# Patient Record
Sex: Female | Born: 1945 | Race: Black or African American | Hispanic: No | Smoking: Current some day smoker
Health system: Southern US, Community
[De-identification: ages and names within clinical notes are randomized; demographics above are authoritative.]

## PROBLEM LIST (undated history)

## (undated) DIAGNOSIS — E119 Type 2 diabetes mellitus without complications: Secondary | ICD-10-CM

## (undated) DIAGNOSIS — M1990 Unspecified osteoarthritis, unspecified site: Secondary | ICD-10-CM

## (undated) DIAGNOSIS — C801 Malignant (primary) neoplasm, unspecified: Secondary | ICD-10-CM

## (undated) DIAGNOSIS — E049 Nontoxic goiter, unspecified: Secondary | ICD-10-CM

## (undated) DIAGNOSIS — E7800 Pure hypercholesterolemia, unspecified: Secondary | ICD-10-CM

## (undated) DIAGNOSIS — I10 Essential (primary) hypertension: Secondary | ICD-10-CM

## (undated) DIAGNOSIS — K219 Gastro-esophageal reflux disease without esophagitis: Secondary | ICD-10-CM

## (undated) HISTORY — PX: BACK SURGERY: SHX140

## (undated) HISTORY — PX: BUNIONECTOMY: SHX129

## (undated) HISTORY — PX: TUBAL LIGATION: SHX77

## (undated) HISTORY — PX: CHOLECYSTECTOMY: SHX55

## (undated) HISTORY — PX: EYE SURGERY: SHX253

## (undated) HISTORY — PX: JOINT REPLACEMENT: SHX530

---

## 1995-08-13 HISTORY — PX: LAMINECTOMY: SHX219

## 2001-12-04 ENCOUNTER — Ambulatory Visit (HOSPITAL_COMMUNITY): Admission: RE | Admit: 2001-12-04 | Discharge: 2001-12-04 | Payer: Self-pay | Admitting: Family Medicine

## 2001-12-04 ENCOUNTER — Encounter: Payer: Self-pay | Admitting: Family Medicine

## 2003-01-25 ENCOUNTER — Encounter: Payer: Self-pay | Admitting: Family Medicine

## 2003-01-25 ENCOUNTER — Ambulatory Visit (HOSPITAL_COMMUNITY): Admission: RE | Admit: 2003-01-25 | Discharge: 2003-01-25 | Payer: Self-pay | Admitting: Family Medicine

## 2004-09-11 ENCOUNTER — Emergency Department (HOSPITAL_COMMUNITY): Admission: EM | Admit: 2004-09-11 | Discharge: 2004-09-11 | Payer: Self-pay | Admitting: Emergency Medicine

## 2005-01-23 ENCOUNTER — Ambulatory Visit: Payer: Self-pay | Admitting: Orthopedic Surgery

## 2007-08-13 HISTORY — PX: COLONOSCOPY: SHX5424

## 2008-05-19 ENCOUNTER — Ambulatory Visit: Payer: Self-pay | Admitting: Gastroenterology

## 2008-05-27 ENCOUNTER — Ambulatory Visit (HOSPITAL_COMMUNITY): Admission: RE | Admit: 2008-05-27 | Discharge: 2008-05-27 | Payer: Self-pay | Admitting: Gastroenterology

## 2008-05-27 ENCOUNTER — Encounter: Payer: Self-pay | Admitting: Gastroenterology

## 2008-05-27 ENCOUNTER — Ambulatory Visit: Payer: Self-pay | Admitting: Gastroenterology

## 2008-05-31 ENCOUNTER — Emergency Department (HOSPITAL_COMMUNITY): Admission: EM | Admit: 2008-05-31 | Discharge: 2008-05-31 | Payer: Self-pay | Admitting: Emergency Medicine

## 2008-12-20 ENCOUNTER — Encounter (INDEPENDENT_AMBULATORY_CARE_PROVIDER_SITE_OTHER): Payer: Self-pay | Admitting: *Deleted

## 2009-02-03 DIAGNOSIS — E785 Hyperlipidemia, unspecified: Secondary | ICD-10-CM | POA: Insufficient documentation

## 2009-02-03 DIAGNOSIS — K219 Gastro-esophageal reflux disease without esophagitis: Secondary | ICD-10-CM | POA: Insufficient documentation

## 2009-02-03 DIAGNOSIS — M129 Arthropathy, unspecified: Secondary | ICD-10-CM | POA: Insufficient documentation

## 2009-02-03 DIAGNOSIS — D126 Benign neoplasm of colon, unspecified: Secondary | ICD-10-CM | POA: Insufficient documentation

## 2009-02-03 DIAGNOSIS — I10 Essential (primary) hypertension: Secondary | ICD-10-CM | POA: Insufficient documentation

## 2009-02-09 ENCOUNTER — Ambulatory Visit: Payer: Self-pay | Admitting: Gastroenterology

## 2009-11-15 ENCOUNTER — Encounter (HOSPITAL_COMMUNITY): Admission: RE | Admit: 2009-11-15 | Discharge: 2009-12-15 | Payer: Self-pay | Admitting: Family Medicine

## 2009-12-25 ENCOUNTER — Ambulatory Visit (HOSPITAL_COMMUNITY): Admission: RE | Admit: 2009-12-25 | Discharge: 2009-12-25 | Payer: Self-pay | Admitting: Family Medicine

## 2010-12-25 ENCOUNTER — Other Ambulatory Visit (HOSPITAL_COMMUNITY): Payer: Self-pay | Admitting: Family Medicine

## 2010-12-25 DIAGNOSIS — E049 Nontoxic goiter, unspecified: Secondary | ICD-10-CM

## 2010-12-25 DIAGNOSIS — Z139 Encounter for screening, unspecified: Secondary | ICD-10-CM

## 2010-12-25 NOTE — Assessment & Plan Note (Signed)
NAME:  Kristina Huang, Kristina Huang              CHART#:  [id number]   DATE:  05/19/2008                       DOB:  [birth year]   REFERRING PHYSICIAN:  Reynolds Bowl   REASON FOR CONSULTATION:  Irritable bowel syndrome.   HISTORY OF PRESENT ILLNESS:  The patient is a 65 year old female, who  has never had a colonoscopy.  She has intermittent changes in her bowel  habits and abdominal pain.  She denies any rectal bleeding.  She may go  7-8 times a day; sometimes it is 3-4 times a day.  She denies any  diarrhea.  She is not taking any fiber.  She has been gaining weight.  She does use ibuprofen rarely.  She denies any use of Aleve, BC Powder  or Goody's Powder.  She uses Tylenol for arthritis pain.   PAST MEDICAL HISTORY:  1. Arthritis.  2. Hypertension.  3. GERD.  4. Hyperlipidemia.   PAST SURGICAL HISTORY:  1. Tubal ligation.  2. Cholecystectomy for stones.  3. Spinal fusion.   ALLERGIES:  No known drug allergies.   MEDICATIONS:  1. Tylenol.  2. Lovastatin.  3. Lisinopril/hydrochlorothiazide.  4. Omeprazole 20 mg daily.  5. Tramadol 50 mg two b.i.d.   FAMILY HISTORY:  She has a family history of diabetes and Crohn disease.  She denies any family history of colon cancer or colon polyps.   SOCIAL HISTORY:  She has been walking for 2-3 months, trying to lose  weight.  She is divorced.  She is unemployed since 1997.  She quit  smoking a month ago.  She rarely drinks alcohol.   REVIEW OF SYSTEMS:  As per the HPI, otherwise all systems are negative.   PHYSICAL EXAMINATION:  VITAL SIGNS:  Weight 171 pounds, height 5 feet 3  inches, temperature 96, blood pressure 110/70, pulse 104.GENERAL:  She  is in no apparent distress.  Alert and oriented x4.HEENT:  Atraumatic,  normocephalic.  Pupils are equal and reactive to light.  Mouth, no oral  lesions.  Posterior pharynx without erythema or exudate.LUNGS:  Clear to  auscultation bilaterally.CARDIOVASCULAR:  Regular rhythm.  No murmur.  Normal S1 and S2.ABDOMEN:  Bowel sounds are present, soft, nontender,  nondistended.  No rebound or guarding.EXTREMITIES:  No cyanosis or  edema.NEURO:  She has no focal neurologic deficits.   ASSESSMENT:  The patient is a 65 year old female, who has intermittent  changes in her bowel habits which is likely related to irritable bowel  syndrome.  Her reflux is fairly well controlled on omeprazole 20 mg a  day. Thank you for allowing me to see the patient in consultation.  My  recommendations follow.   RECOMMENDATIONS:  1. She is given discharge instructions in writing.  She is asked to      continue take omeprazole 30 minutes before first meal.  She is      asked to follow a low-fat diet and lose 10 pounds over the next 3      months which will help with her reflux.  2. She will be scheduled for colonoscopy within the next 2 weeks.  She      has a follow up appointment to see me in 3 months.  She is also      asked to add fiber to her diet.       [employer],  M.D.  Electronically Signed     SM/MEDQ  D:  05/19/2008  T:  05/20/2008  Job:  [id number]   cc:   Dr. __________

## 2010-12-25 NOTE — Op Note (Signed)
Kristina Huang             ACCOUNT NO.:  [id number]   MEDICAL RECORD NO.:  [id number]          PATIENT TYPE:  AMB   LOCATION:  DAY                           FACILITY:  [facility]   PHYSICIAN:  Kassie Mends, M.D.      DATE OF BIRTH:  1946-02-23   DATE OF PROCEDURE:  05/27/2008  DATE OF DISCHARGE:                               OPERATIVE REPORT   REFERRING Tyna Huertas:  Dr. Reynolds Bowl.   PROCEDURE:  Colonoscopy with cold forceps and snare cautery polypectomy.   INDICATION FOR EXAM:  Kristina Huang is a 65 year old female who has never  had a colonoscopy.  She was complaining of changes in her bowel habits.   FINDINGS:  1. A 8-mm sessile cecal polyp removed via snare cautery (20 watts).  A      3-mm ascending colon polyp removed via cold forceps.  A 1.5-cm      serpiginous ascending colon polyp located on a fold, which was      removed via snare cautery (25 watts).  2. Frequent sigmoid colon diverticula.  Otherwise, no masses,      inflammatory changes, or AVMs seen.  3. Moderate internal hemorrhoids.  Otherwise, normal retroflexed view      of the rectum.   DIAGNOSES:  1. Colon polyps.  2. Mild sigmoid colon diverticulosis.  3. Moderate internal hemorrhoids.   RECOMMENDATIONS:  1. She should begin high-fiber diet on June 04, 2008.  2. No aspirin until June 04, 2008.  3. Screening colonoscopy in 5 years.  If her polyp shows advanced      features, then would consider screening colonoscopy in 3 years.  4. We will call her with the results of her biopsies.  5. She is given a handout on polyps, diverticulosis, hemorrhoids, and      high-fiber diet.  No aspirin, NSAIDs, or anticoagulation for 7      days.   MEDICATIONS:  1. Demerol 75 mg IV.  2. Versed 5 mg IV.   PROCEDURE TECHNIQUE:  Physical exam was performed.  Informed consent was  obtained from the patient after explaining the benefits, risks, and  alternatives to the procedure.  The patient was connected to the  monitor  and placed in left lateral position.  Continuous oxygen was provided by  nasal cannula and IV medicine administered through an indwelling  cannula.  After administration of sedation and rectal exam, the  patient's rectum was intubated and scope was advanced under direct  visualization to the cecum.  Scope was removed slowly by carefully  examining the color, texture, anatomy, and integrity of the mucosa on  the way out.  The patient was recovered in endoscopy and discharged home  in satisfactory condition.   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  Electronically Signed     SM/MEDQ  D:  05/27/2008  T:  05/27/2008  Job:  [id number]   cc:   Dr. Reynolds Bowl

## 2011-01-01 ENCOUNTER — Ambulatory Visit (HOSPITAL_COMMUNITY)
Admission: RE | Admit: 2011-01-01 | Discharge: 2011-01-01 | Disposition: A | Discharge status: Home / Self Care | Payer: Medicare FFS | Source: Ambulatory Visit | Attending: Family Medicine | Admitting: Family Medicine

## 2011-01-01 DIAGNOSIS — Z139 Encounter for screening, unspecified: Secondary | ICD-10-CM

## 2011-01-01 DIAGNOSIS — E049 Nontoxic goiter, unspecified: Secondary | ICD-10-CM

## 2011-01-01 DIAGNOSIS — Z1231 Encounter for screening mammogram for malignant neoplasm of breast: Secondary | ICD-10-CM | POA: Insufficient documentation

## 2012-08-12 ENCOUNTER — Emergency Department (HOSPITAL_COMMUNITY)
Admission: EM | Admit: 2012-08-12 | Discharge: 2012-08-12 | Disposition: A | Discharge status: Home / Self Care | Payer: Medicare FFS | Attending: Emergency Medicine | Admitting: Emergency Medicine

## 2012-08-12 ENCOUNTER — Encounter (HOSPITAL_COMMUNITY): Payer: Self-pay

## 2012-08-12 DIAGNOSIS — R5383 Other fatigue: Secondary | ICD-10-CM | POA: Insufficient documentation

## 2012-08-12 DIAGNOSIS — K5289 Other specified noninfective gastroenteritis and colitis: Secondary | ICD-10-CM | POA: Insufficient documentation

## 2012-08-12 DIAGNOSIS — R197 Diarrhea, unspecified: Secondary | ICD-10-CM | POA: Insufficient documentation

## 2012-08-12 DIAGNOSIS — Z79899 Other long term (current) drug therapy: Secondary | ICD-10-CM | POA: Insufficient documentation

## 2012-08-12 DIAGNOSIS — E119 Type 2 diabetes mellitus without complications: Secondary | ICD-10-CM | POA: Insufficient documentation

## 2012-08-12 DIAGNOSIS — K529 Noninfective gastroenteritis and colitis, unspecified: Secondary | ICD-10-CM

## 2012-08-12 DIAGNOSIS — R51 Headache: Secondary | ICD-10-CM | POA: Insufficient documentation

## 2012-08-12 DIAGNOSIS — E7800 Pure hypercholesterolemia, unspecified: Secondary | ICD-10-CM | POA: Insufficient documentation

## 2012-08-12 DIAGNOSIS — K219 Gastro-esophageal reflux disease without esophagitis: Secondary | ICD-10-CM | POA: Insufficient documentation

## 2012-08-12 DIAGNOSIS — R5381 Other malaise: Secondary | ICD-10-CM | POA: Insufficient documentation

## 2012-08-12 HISTORY — DX: Gastro-esophageal reflux disease without esophagitis: K21.9

## 2012-08-12 HISTORY — DX: Type 2 diabetes mellitus without complications: E11.9

## 2012-08-12 HISTORY — DX: Pure hypercholesterolemia, unspecified: E78.00

## 2012-08-12 MED ORDER — LOPERAMIDE HCL 2 MG PO TABS: 2.0000 mg | ORAL_TABLET | Freq: Four times a day (QID) | ORAL | Status: DC | PRN

## 2012-08-12 MED ORDER — SODIUM CHLORIDE 0.9 % IV SOLN
Freq: Once | INTRAVENOUS | Status: AC
  Administered 2012-08-12: 11:00:00 via INTRAVENOUS

## 2012-08-12 NOTE — ED Provider Notes (Signed)
History   This chart was scribed for Shelda Jakes, MD, by Frederik Pear, ER scribe. The patient was seen in room [facility]18[facility] and the patient's care was started at 1203.    CSN: [id number]  Arrival date & time 08/12/12  1006   First MD Initiated Contact with Patient 08/12/12 1203      Chief Complaint  Patient presents with  . Emesis  . Diarrhea  . Headache    (Consider location/radiation/quality/duration/timing/severity/associated sxs/prior treatment) HPI  Kristina Huang is a 67 y.o. female who presents to the Emergency Department complaining of intermittent, moderate diarrhea with associated gradually improving emesis with her last episode at 00:00 and headache that began yesterday at 17:00. She denies any episodes of emesis today. She denies any hematochezia, abdominal pain, or hematuria. She reports several sick contacts that have recently had the same symptoms.    PCP is [facility].  Past Medical History  Diagnosis Date  . Diabetes mellitus without complication   . Hypercholesterolemia   . Acid reflux     Past Surgical History  Procedure Date  . Back surgery     spinal fusion  . Cholecystectomy   . Tubal ligation     No family history on file.  History  Substance Use Topics  . Smoking status: Never Smoker   . Smokeless tobacco: Not on file  . Alcohol Use: No    OB History    Grav Para Term Preterm Abortions TAB SAB Ect Mult Living                  Review of Systems  Constitutional: Positive for fatigue.  HENT: Negative for congestion.   Eyes: Negative for redness.  Respiratory: Negative for cough and shortness of breath.   Cardiovascular: Negative for chest pain.  Gastrointestinal: Positive for vomiting and diarrhea.  Genitourinary: Negative for dysuria.  Musculoskeletal: Negative for myalgias and back pain.  Skin: Negative for rash.  Neurological: Positive for headaches.  Hematological: Does not bruise/bleed easily.    Psychiatric/Behavioral: Negative for confusion.  All other systems reviewed and are negative.    Allergies  Review of patient's allergies indicates no known allergies.  Home Medications   Current Outpatient Rx  Name  Route  Sig  Dispense  Refill  . ACETAMINOPHEN ER 650 MG PO TBCR   Oral   Take 1,300 mg by mouth every 8 (eight) hours as needed. Pain.         . ASPIRIN EC 81 MG PO TBEC   Oral   Take 81 mg by mouth daily.         Marland Kitchen LOVASTATIN 20 MG PO TABS   Oral   Take 20 mg by mouth at bedtime.         Marland Kitchen METFORMIN HCL 500 MG PO TABS   Oral   Take 500 mg by mouth daily with breakfast.         . OMEPRAZOLE 20 MG PO CPDR   Oral   Take 20 mg by mouth daily.         Marland Kitchen LOPERAMIDE HCL 2 MG PO TABS   Oral   Take 1 tablet (2 mg total) by mouth 4 (four) times daily as needed for diarrhea or loose stools.   30 tablet   1     BP 128/86  Pulse 96  Temp 99.2 F (37.3 C) (Oral)  Resp 18  Ht 5\' 3"  (1.6 m)  Wt 165 lb (74.844 kg)  BMI 29.23 kg/m2  SpO2 99%  Physical Exam  Nursing note and vitals reviewed. Constitutional: She is oriented to person, place, and time. She appears well-developed and well-nourished.  HENT:  Head: Normocephalic and atraumatic.  Neck: Neck supple.  Cardiovascular: Normal rate, regular rhythm and normal heart sounds.   No murmur heard. Pulmonary/Chest: Effort normal and breath sounds normal. No respiratory distress. She has no wheezes. She has no rales. She exhibits no tenderness.  Abdominal: Soft. Bowel sounds are normal. She exhibits no distension and no mass. There is no tenderness. There is no rebound and no guarding.  Musculoskeletal: Normal range of motion. She exhibits no edema.  Neurological: She is alert and oriented to person, place, and time. No cranial nerve deficit. She exhibits normal muscle tone. Coordination normal.  Skin: Skin is warm and dry.  Psychiatric: She has a normal mood and affect. Thought content normal.     ED Course  Procedures (including critical care time)  DIAGNOSTIC STUDIES: Oxygen Saturation is 99% on room air, normal by my interpretation.    COORDINATION OF CARE:  13:04- Discussed planned course of treatment with the patient, including IV fluids, who is agreeable at this time.   Labs Reviewed - No data to display No results found.   1. Gastroenteritis       MDM  Symptoms consistent with viral gastroenteritis and patient improving no further vomiting. Non toxic and in NAD. Improved with fluids.   I personally performed the services described in this documentation, which was scribed in my presence. The recorded information has been reviewed and is accurate.         Shelda Jakes, MD 08/15/12 1155

## 2012-08-12 NOTE — ED Notes (Signed)
Pt reports vomiting and diarrhea since last night.  C/O headache this morning.

## 2012-10-04 ENCOUNTER — Emergency Department (HOSPITAL_COMMUNITY): Payer: Medicare FFS

## 2012-10-04 ENCOUNTER — Emergency Department (HOSPITAL_COMMUNITY)
Admission: EM | Admit: 2012-10-04 | Discharge: 2012-10-04 | Disposition: A | Discharge status: Home / Self Care | Payer: Medicare FFS | Attending: Emergency Medicine | Admitting: Emergency Medicine

## 2012-10-04 ENCOUNTER — Encounter (HOSPITAL_COMMUNITY): Payer: Self-pay | Admitting: *Deleted

## 2012-10-04 DIAGNOSIS — T148XXA Other injury of unspecified body region, initial encounter: Secondary | ICD-10-CM

## 2012-10-04 DIAGNOSIS — S91309A Unspecified open wound, unspecified foot, initial encounter: Secondary | ICD-10-CM | POA: Insufficient documentation

## 2012-10-04 DIAGNOSIS — Z79899 Other long term (current) drug therapy: Secondary | ICD-10-CM | POA: Insufficient documentation

## 2012-10-04 DIAGNOSIS — E119 Type 2 diabetes mellitus without complications: Secondary | ICD-10-CM | POA: Insufficient documentation

## 2012-10-04 DIAGNOSIS — Z7982 Long term (current) use of aspirin: Secondary | ICD-10-CM | POA: Insufficient documentation

## 2012-10-04 DIAGNOSIS — K219 Gastro-esophageal reflux disease without esophagitis: Secondary | ICD-10-CM | POA: Insufficient documentation

## 2012-10-04 DIAGNOSIS — E7800 Pure hypercholesterolemia, unspecified: Secondary | ICD-10-CM | POA: Insufficient documentation

## 2012-10-04 DIAGNOSIS — X58XXXA Exposure to other specified factors, initial encounter: Secondary | ICD-10-CM | POA: Insufficient documentation

## 2012-10-04 DIAGNOSIS — Y929 Unspecified place or not applicable: Secondary | ICD-10-CM | POA: Insufficient documentation

## 2012-10-04 DIAGNOSIS — Y9389 Activity, other specified: Secondary | ICD-10-CM | POA: Insufficient documentation

## 2012-10-04 MED ORDER — SULFAMETHOXAZOLE-TRIMETHOPRIM 800-160 MG PO TABS: 1.0000 | ORAL_TABLET | Freq: Two times a day (BID) | ORAL | Status: DC

## 2012-10-04 MED ORDER — LIDOCAINE HCL (PF) 1 % IJ SOLN
INTRAMUSCULAR | Status: AC
  Filled 2012-10-04: qty 5

## 2012-10-04 MED ORDER — HYDROCODONE-ACETAMINOPHEN 5-325 MG PO TABS: 2.0000 | ORAL_TABLET | ORAL | Status: DC | PRN

## 2012-10-04 NOTE — ED Notes (Signed)
Pt c/o right foot pain. Pt states she stepped on toothpick this evening and part of it "broke off into foot". No foreign body noted on assessment. Xray has been ordered.

## 2012-10-04 NOTE — ED Notes (Signed)
Pt says she stepped on a toothpick and part of it broke of of it in her right foot.

## 2012-10-04 NOTE — ED Provider Notes (Signed)
History     CSN: [id number]  Arrival date & time 10/04/12  1903   First MD Initiated Contact with Patient 10/04/12 1948      Chief Complaint  Patient presents with  . Foreign Body  . Foot Pain    (Consider location/radiation/quality/duration/timing/severity/associated sxs/prior treatment) Patient is a 67 y.o. female presenting with foreign body and lower extremity pain. The history is provided by the patient. No language interpreter was used.  Foreign Body  The current episode started 3 to 5 hours ago. The incident was witnessed.  Foot Pain  Pt reports she stepped on a toothpick.   Pt reports she thinks she has a piece left in her foot.  Past Medical History  Diagnosis Date  . Diabetes mellitus without complication   . Hypercholesterolemia   . Acid reflux     Past Surgical History  Procedure Laterality Date  . Back surgery      spinal fusion  . Cholecystectomy    . Tubal ligation      History reviewed. No pertinent family history.  History  Substance Use Topics  . Smoking status: Never Smoker   . Smokeless tobacco: Not on file  . Alcohol Use: No    OB History   Grav Para Term Preterm Abortions TAB SAB Ect Mult Living                  Review of Systems  All other systems reviewed and are negative.    Allergies  Review of patient's allergies indicates no known allergies.  Home Medications   Current Outpatient Rx  Name  Route  Sig  Dispense  Refill  . acetaminophen (TYLENOL) 650 MG CR tablet   Oral   Take 1,300 mg by mouth every 8 (eight) hours as needed. Pain.         Marland Kitchen aspirin EC 81 MG tablet   Oral   Take 81 mg by mouth every evening.          . lovastatin (MEVACOR) 20 MG tablet   Oral   Take 20 mg by mouth at bedtime.         . metFORMIN (GLUCOPHAGE) 500 MG tablet   Oral   Take 500 mg by mouth daily with breakfast.         . omeprazole (PRILOSEC) 20 MG capsule   Oral   Take 20 mg by mouth daily.           BP 147/84   Pulse 97  Temp(Src) 97.9 F (36.6 C) (Oral)  Resp 20  Ht 5\' 3"  (1.6 m)  Wt 162 lb (73.483 kg)  BMI 28.7 kg/m2  SpO2 95%  Physical Exam  Nursing note and vitals reviewed. Constitutional: She appears well-developed and well-nourished.  Musculoskeletal: She exhibits tenderness.  Small punture wound   Neurological: She is alert.  Skin: Skin is warm.  Psychiatric: She has a normal mood and affect.    ED Course  Procedures (including critical care time)  Labs Reviewed - No data to display Dg Foot Complete Right  10/04/2012  *RADIOLOGY REPORT*  Clinical Data: Right foot pain following stepped on toothpick.  RIGHT FOOT COMPLETE - 3+ VIEW  Comparison: None  Findings: There is no evidence of acute fracture, subluxation or dislocation. No unexpected radiopaque foreign bodies are identified. A surgical screw within the first metatarsal head is noted. The Lisfranc joints are intact. A small calcaneal spur is present.  IMPRESSION: No evidence of acute bony abnormality or  unexpected radiopaque foreign body.   Original Report Authenticated By: Harmon Pier, M.D.      No diagnosis found.    MDM  Local xylo to area,   I explored with forcep,  I am unable to palpate a foreign body.    I counseled pt.   I advised her to call Dr. Emelda Fear to be seen this week.  Pt is diabetic.   I will cover with bactrim,  Pt given vicodin for pain       Lonia Skinner [place], [place] 10/04/12 2044

## 2012-10-04 NOTE — ED Provider Notes (Signed)
Medical screening examination/treatment/procedure(s) were performed by non-physician practitioner and as supervising physician I was immediately available for consultation/collaboration.   Pura Picinich L Elayah Klooster, MD 10/04/12 2240 

## 2013-01-13 ENCOUNTER — Emergency Department (HOSPITAL_COMMUNITY)
Admission: EM | Admit: 2013-01-13 | Discharge: 2013-01-13 | Disposition: A | Discharge status: Home / Self Care | Payer: Medicare FFS | Attending: Emergency Medicine | Admitting: Emergency Medicine

## 2013-01-13 ENCOUNTER — Encounter (HOSPITAL_COMMUNITY): Payer: Self-pay | Admitting: Emergency Medicine

## 2013-01-13 DIAGNOSIS — Z79899 Other long term (current) drug therapy: Secondary | ICD-10-CM | POA: Insufficient documentation

## 2013-01-13 DIAGNOSIS — Z9089 Acquired absence of other organs: Secondary | ICD-10-CM | POA: Insufficient documentation

## 2013-01-13 DIAGNOSIS — K219 Gastro-esophageal reflux disease without esophagitis: Secondary | ICD-10-CM | POA: Insufficient documentation

## 2013-01-13 DIAGNOSIS — Z9889 Other specified postprocedural states: Secondary | ICD-10-CM | POA: Insufficient documentation

## 2013-01-13 DIAGNOSIS — M545 Low back pain, unspecified: Secondary | ICD-10-CM | POA: Insufficient documentation

## 2013-01-13 DIAGNOSIS — Z7982 Long term (current) use of aspirin: Secondary | ICD-10-CM | POA: Insufficient documentation

## 2013-01-13 DIAGNOSIS — M549 Dorsalgia, unspecified: Secondary | ICD-10-CM

## 2013-01-13 DIAGNOSIS — E7800 Pure hypercholesterolemia, unspecified: Secondary | ICD-10-CM | POA: Insufficient documentation

## 2013-01-13 DIAGNOSIS — Z9851 Tubal ligation status: Secondary | ICD-10-CM | POA: Insufficient documentation

## 2013-01-13 DIAGNOSIS — E119 Type 2 diabetes mellitus without complications: Secondary | ICD-10-CM | POA: Insufficient documentation

## 2013-01-13 LAB — URINALYSIS, ROUTINE W REFLEX MICROSCOPIC
Bilirubin Urine: NEGATIVE
Leukocytes, UA: NEGATIVE
Nitrite: NEGATIVE
Specific Gravity, Urine: 1.02 (ref 1.005–1.030)
pH: 7 (ref 5.0–8.0)

## 2013-01-13 MED ORDER — HYDROCODONE-ACETAMINOPHEN 5-325 MG PO TABS: 1.0000 | ORAL_TABLET | Freq: Four times a day (QID) | ORAL | Status: DC | PRN

## 2013-01-13 MED ORDER — CYCLOBENZAPRINE HCL 10 MG PO TABS: 10.0000 mg | ORAL_TABLET | Freq: Three times a day (TID) | ORAL | Status: DC | PRN

## 2013-01-13 MED ORDER — HYDROMORPHONE HCL PF 1 MG/ML IJ SOLN
1.0000 mg | Freq: Once | INTRAMUSCULAR | Status: AC
  Administered 2013-01-13: 1 mg via INTRAMUSCULAR
  Filled 2013-01-13: qty 1

## 2013-01-13 NOTE — ED Provider Notes (Signed)
History     CSN: [id number]  Arrival date & time 01/13/13  1706   First MD Initiated Contact with Patient 01/13/13 1745      Chief Complaint  Patient presents with  . Flank Pain    (Consider location/radiation/quality/duration/timing/severity/associated sxs/prior treatment) Patient is a 67 y.o. female presenting with flank pain. The history is provided by the patient (the pt complains of back pain.  worse with movement). No language interpreter was used.  Flank Pain This is a new problem. The current episode started more than 2 days ago. The problem occurs daily. The problem has not changed since onset.Pertinent negatives include no chest pain, no abdominal pain and no headaches. Nothing aggravates the symptoms. Nothing relieves the symptoms.    Past Medical History  Diagnosis Date  . Diabetes mellitus without complication   . Hypercholesterolemia   . Acid reflux     Past Surgical History  Procedure Laterality Date  . Back surgery      spinal fusion  . Cholecystectomy    . Tubal ligation      No family history on file.  History  Substance Use Topics  . Smoking status: Never Smoker   . Smokeless tobacco: Not on file  . Alcohol Use: No    OB History   Grav Para Term Preterm Abortions TAB SAB Ect Mult Living                  Review of Systems  Constitutional: Negative for appetite change and fatigue.  HENT: Negative for congestion, sinus pressure and ear discharge.   Eyes: Negative for discharge.  Respiratory: Negative for cough.   Cardiovascular: Negative for chest pain.  Gastrointestinal: Negative for abdominal pain and diarrhea.  Genitourinary: Positive for flank pain. Negative for frequency and hematuria.  Musculoskeletal: Negative for back pain.  Skin: Negative for rash.  Neurological: Negative for seizures and headaches.  Psychiatric/Behavioral: Negative for hallucinations.    Allergies  Review of patient's allergies indicates no known  allergies.  Home Medications   Current Outpatient Rx  Name  Route  Sig  Dispense  Refill  . acetaminophen (TYLENOL) 650 MG CR tablet   Oral   Take 1,300 mg by mouth every 8 (eight) hours as needed. Pain.         Marland Kitchen aspirin EC 81 MG tablet   Oral   Take 81 mg by mouth every evening.          . lovastatin (MEVACOR) 20 MG tablet   Oral   Take 20 mg by mouth at bedtime.         . metFORMIN (GLUCOPHAGE) 500 MG tablet   Oral   Take 500 mg by mouth daily with breakfast.         . omeprazole (PRILOSEC) 20 MG capsule   Oral   Take 20 mg by mouth daily.         . cyclobenzaprine (FLEXERIL) 10 MG tablet   Oral   Take 1 tablet (10 mg total) by mouth 3 (three) times daily as needed for muscle spasms.   30 tablet   0   . HYDROcodone-acetaminophen (NORCO/VICODIN) 5-325 MG per tablet   Oral   Take 1 tablet by mouth every 6 (six) hours as needed for pain.   30 tablet   0     BP 135/75  Pulse 71  Temp(Src) 98.7 F (37.1 C) (Oral)  Resp 18  Ht 5\' 3"  (1.6 m)  Wt 165 lb (74.844  kg)  BMI 29.24 kg/m2  SpO2 95%  Physical Exam  Constitutional: She is oriented to person, place, and time. She appears well-developed.  HENT:  Head: Normocephalic.  Eyes: Conjunctivae and EOM are normal. No scleral icterus.  Neck: Neck supple. No thyromegaly present.  Cardiovascular: Normal rate and regular rhythm.  Exam reveals no gallop and no friction rub.   No murmur heard. Pulmonary/Chest: No stridor. She has no wheezes. She has no rales. She exhibits no tenderness.  Abdominal: She exhibits no distension. There is no tenderness. There is no rebound.  Musculoskeletal: She exhibits no edema.  Tender right lumbar muscles  Lymphadenopathy:    She has no cervical adenopathy.  Neurological: She is oriented to person, place, and time. Coordination normal.  Skin: No rash noted. No erythema.  Psychiatric: She has a normal mood and affect. Her behavior is normal.    ED Course  Procedures  (including critical care time)  Labs Reviewed  URINALYSIS, ROUTINE W REFLEX MICROSCOPIC - Abnormal; Notable for the following:    APPearance HAZY (*)    All other components within normal limits   No results found.   1. Back pain       MDM          Benny Lennert, MD 01/13/13 2003

## 2013-01-13 NOTE — ED Notes (Addendum)
Pt c/o right side/back/flank pain x 4 days. Pt denies urinary problems/n/v/d.

## 2013-04-20 ENCOUNTER — Encounter: Payer: Self-pay | Admitting: Gastroenterology

## 2013-05-04 ENCOUNTER — Other Ambulatory Visit (HOSPITAL_COMMUNITY): Payer: Self-pay | Admitting: Family Medicine

## 2013-05-04 DIAGNOSIS — Z139 Encounter for screening, unspecified: Secondary | ICD-10-CM

## 2013-05-06 ENCOUNTER — Ambulatory Visit (HOSPITAL_COMMUNITY)
Admission: RE | Admit: 2013-05-06 | Discharge: 2013-05-06 | Disposition: A | Discharge status: Home / Self Care | Payer: Medicare FFS | Source: Ambulatory Visit | Attending: Family Medicine | Admitting: Family Medicine

## 2013-05-06 DIAGNOSIS — Z1231 Encounter for screening mammogram for malignant neoplasm of breast: Secondary | ICD-10-CM | POA: Insufficient documentation

## 2013-05-06 DIAGNOSIS — Z139 Encounter for screening, unspecified: Secondary | ICD-10-CM

## 2013-09-29 ENCOUNTER — Other Ambulatory Visit: Payer: Self-pay | Admitting: Neurosurgery

## 2013-09-29 DIAGNOSIS — M545 Low back pain, unspecified: Secondary | ICD-10-CM

## 2013-10-07 ENCOUNTER — Other Ambulatory Visit: Payer: Medicare FFS

## 2013-10-14 ENCOUNTER — Ambulatory Visit
Admission: RE | Admit: 2013-10-14 | Discharge: 2013-10-14 | Disposition: A | Discharge status: Home / Self Care | Payer: Medicare FFS | Source: Ambulatory Visit | Attending: Neurosurgery | Admitting: Neurosurgery

## 2013-10-14 DIAGNOSIS — M545 Low back pain, unspecified: Secondary | ICD-10-CM

## 2013-10-14 MED ORDER — GADOBENATE DIMEGLUMINE 529 MG/ML IV SOLN
15.0000 mL | Freq: Once | INTRAVENOUS | Status: AC | PRN
  Administered 2013-10-14: 15 mL via INTRAVENOUS

## 2013-10-26 ENCOUNTER — Other Ambulatory Visit: Payer: Self-pay | Admitting: Neurosurgery

## 2013-11-09 ENCOUNTER — Encounter (HOSPITAL_COMMUNITY)
Admission: RE | Admit: 2013-11-09 | Discharge: 2013-11-09 | Disposition: A | Discharge status: Home / Self Care | Payer: Medicare FFS | Source: Ambulatory Visit | Attending: Neurosurgery | Admitting: Neurosurgery

## 2013-11-09 ENCOUNTER — Encounter (HOSPITAL_COMMUNITY)
Admission: RE | Admit: 2013-11-09 | Discharge: 2013-11-09 | Disposition: A | Discharge status: Home / Self Care | Payer: Medicare FFS | Source: Ambulatory Visit | Attending: Anesthesiology | Admitting: Anesthesiology

## 2013-11-09 ENCOUNTER — Encounter (HOSPITAL_COMMUNITY): Payer: Self-pay

## 2013-11-09 ENCOUNTER — Encounter (HOSPITAL_COMMUNITY): Payer: Self-pay | Admitting: Pharmacy Technician

## 2013-11-09 DIAGNOSIS — Z01812 Encounter for preprocedural laboratory examination: Secondary | ICD-10-CM | POA: Insufficient documentation

## 2013-11-09 DIAGNOSIS — Z01810 Encounter for preprocedural cardiovascular examination: Secondary | ICD-10-CM | POA: Insufficient documentation

## 2013-11-09 DIAGNOSIS — Z01818 Encounter for other preprocedural examination: Secondary | ICD-10-CM | POA: Insufficient documentation

## 2013-11-09 HISTORY — DX: Nontoxic goiter, unspecified: E04.9

## 2013-11-09 HISTORY — DX: Unspecified osteoarthritis, unspecified site: M19.90

## 2013-11-09 LAB — CBC
HEMATOCRIT: 46.7 % — AB (ref 36.0–46.0)
HEMOGLOBIN: 15.9 g/dL — AB (ref 12.0–15.0)
MCH: 32.4 pg (ref 26.0–34.0)
MCHC: 34 g/dL (ref 30.0–36.0)
MCV: 95.1 fL (ref 78.0–100.0)
Platelets: 331 10*3/uL (ref 150–400)
RBC: 4.91 MIL/uL (ref 3.87–5.11)
RDW: 12.9 % (ref 11.5–15.5)
WBC: 9 10*3/uL (ref 4.0–10.5)

## 2013-11-09 LAB — BASIC METABOLIC PANEL
BUN: 14 mg/dL (ref 6–23)
CHLORIDE: 102 meq/L (ref 96–112)
CO2: 25 mEq/L (ref 19–32)
CREATININE: 0.81 mg/dL (ref 0.50–1.10)
Calcium: 9.9 mg/dL (ref 8.4–10.5)
GFR calc Af Amer: 85 mL/min — ABNORMAL LOW (ref >90)
GFR calc non Af Amer: 73 mL/min — ABNORMAL LOW (ref >90)
GLUCOSE: 101 mg/dL — AB (ref 70–99)
POTASSIUM: 3.7 meq/L (ref 3.7–5.3)
Sodium: 141 mEq/L (ref 137–147)

## 2013-11-09 LAB — TYPE AND SCREEN
ABO/RH(D): O POS
ANTIBODY SCREEN: NEGATIVE

## 2013-11-09 LAB — SURGICAL PCR SCREEN
MRSA, PCR: NEGATIVE
Staphylococcus aureus: NEGATIVE

## 2013-11-09 LAB — ABO/RH: ABO/RH(D): O POS

## 2013-11-09 NOTE — Progress Notes (Signed)
Primary physician -  [place] Does not have a cardiologist No recent testing cardiac wise

## 2013-11-09 NOTE — Pre-Procedure Instructions (Signed)
Kristina Huang  11/09/2013   Your procedure is scheduled on: Wednesday, April 8th  Report to Admitting at 0630 AM.  Call this number if you have problems the morning of surgery: [phone]   Remember:   Do not eat food or drink liquids after midnight.   Take these medicines the morning of surgery with A SIP OF WATER: prilosec, hydrocodone if needed  Do NOT take any diabetes medication on morning of surgery. Stop taking aspirin, over the counter vitamins, herbal medications and NSAIDS (iburpofen, advil, etc) 5 days prior to surgery   Do not wear jewelry, make-up or nail polish.  Do not wear lotions, powders, or perfumes. You may wear deodorant.  Do not shave 48 hours prior to surgery. Men may shave face and neck.  Do not bring valuables to the hospital.  [facility] is not responsible  for any belongings or valuables.               Contacts, dentures or bridgework may not be worn into surgery.  Leave suitcase in the car. After surgery it may be brought to your room.  For patients admitted to the hospital, discharge time is determined by your treatment team.  Please read over the following fact sheets that you were given: Pain Booklet, Coughing and Deep Breathing, Blood Transfusion Information, MRSA Information and Surgical Site Infection Prevention  [place] - Preparing for Surgery  Before surgery, you can play an important role.  Because skin is not sterile, your skin needs to be as free of germs as possible.  You can reduce the number of germs on you skin by washing with CHG (chlorahexidine gluconate) soap before surgery.  CHG is an antiseptic cleaner which kills germs and bonds with the skin to continue killing germs even after washing.  Please DO NOT use if you have an allergy to CHG or antibacterial soaps.  If your skin becomes reddened/irritated stop using the CHG and inform your nurse when you arrive at Short Stay.  Do not shave (including legs and underarms) for at least  48 hours prior to the first CHG shower.  You may shave your face.  Please follow these instructions carefully:   1.  Shower with CHG Soap the night before surgery and the morning of Surgery.  2.  If you choose to wash your hair, wash your hair first as usual with your normal shampoo.  3.  After you shampoo, rinse your hair and body thoroughly to remove the shampoo.  4.  Use CHG as you would any other liquid soap.  You can apply CHG directly to the skin and wash gently with scrungie or a clean washcloth.  5.  Apply the CHG Soap to your body ONLY FROM THE NECK DOWN.  Do not use on open wounds or open sores.  Avoid contact with your eyes, ears, mouth and genitals (private parts).  Wash genitals (private parts) with your normal soap.  6.  Wash thoroughly, paying special attention to the area where your surgery will be performed.  7.  Thoroughly rinse your body with warm water from the neck down.  8.  DO NOT shower/wash with your normal soap after using and rinsing off the CHG Soap.  9.  Pat yourself dry with a clean towel.            10.  Wear clean pajamas.            11.  Place clean sheets on your bed  the night of your first shower and do not sleep with pets.  Day of Surgery  Do not apply any lotions/deoderants the morning of surgery.  Please wear clean clothes to the hospital/surgery center.

## 2013-11-10 ENCOUNTER — Encounter (HOSPITAL_COMMUNITY): Payer: Self-pay

## 2013-11-10 HISTORY — PX: MAXIMUM ACCESS (MAS)POSTERIOR LUMBAR INTERBODY FUSION (PLIF) 2 LEVEL: SHX6369

## 2013-11-10 NOTE — Progress Notes (Signed)
Anesthesia Chart Review:  Patient is a 68 year old female scheduled for L3-4 PLIF with interbody prosthesis posteriolateral arthrodesis and posterior nonsegmental instrumentation with removal of L4-5 instrumentation on 11/17/2013 by Dr. Christella Noa.   History includes nonsmoker, diabetes mellitus type 2, hypercholesterolemia, arthritis, acid reflux, cholecystectomy, spinal fusion, thyroid goiter (diagnosed on or before 11/2009) with rightward tracheal deviation on 11/09/13 CXR. BMI is 29.7. PCP is Programmer, multimedia, PA-C with [facility].  EKG on 11/09/13 showed ST @ 112 bpm.  CXR on 11/09/13 showed: FINDINGS: Rightward deviation of the trachea is present associated with thyroid goiter demonstrated on prior ultrasound. Tortuosity of the thoracic aortic arch. No airspace disease. No effusion. Cardiopericardial silhouette within normal limits. Cholecystectomy clips are present in the right upper quadrant. Mild thoracic spondylosis.  IMPRESSION: No acute cardiopulmonary disease. Rightward tracheal deviation in the neck and thoracic inlet secondary to thyroid goiter.  The last thyroid imaging (ultrasound) in Epic is from 01/01/11 which showed:  Enlarged left thyroid lobe, 7.8 x 4.5 x 5.5 cm, previously 8.6 x  4.4 x 5.5 cm. Malignancy is not excluded within this mass/lobe sonographically. Pathology results from prior biopsy of 12/14/2009 indicated differential diagnosis of adenomatous nodule and low grade follicular neoplasm. 1.4 x 1.6 x 0.9 cm diameter nodule at superior pole right thyroid lobe, slightly larger than the 1.2 x 0.8 x 1.4 cm measured on previous study. his now meets the size criteria for recommendation of tissue diagnosis to exclude thyroid malignancy; however, if the patient is undergo thyroidectomy based on prior biopsy results of the left lobe mass, pre surgical biopsy of the right thyroid lobe nodule would not be necessary.  Preoperative labs noted.  I updated  anesthesiologist Dr. Glennon Mac re: goiter with right tracheal deviation as this will likely make intubation more challenging.  Further review and evaluation by her assigned anesthesiologist on the day of surgery to discuss the definitive anesthesia plan.    [facility] Short Stay Center/Anesthesiology Phone [phone] 11/10/2013 4:25 PM

## 2013-11-10 NOTE — Anesthesia Preprocedure Evaluation (Addendum)
Anesthesia Evaluation  Patient identified by MRN, date of birth, ID band Patient awake    Reviewed: Allergy & Precautions, H&P , NPO status , Patient's Chart, lab work & pertinent test results, reviewed documented beta blocker date and time   History of Anesthesia Complications Negative for: history of anesthetic complications  Airway Mallampati: II TM Distance: >3 FB Neck ROM: Full    Dental  (+) Teeth Intact, Dental Advisory Given   Pulmonary neg pulmonary ROS,  breath sounds clear to auscultation  Pulmonary exam normal       Cardiovascular hypertension (patient does not take meds), Rhythm:Regular Rate:Normal     Neuro/Psych spondylolisthesis with chronic back pain    GI/Hepatic GERD-  Medicated and Controlled,  Endo/Other  diabetes (glu 121), Well Controlled, Type 2, Oral Hypoglycemic AgentsGoiter: R tracheal deviation  Renal/GU      Musculoskeletal   Abdominal (+) + obese,   Peds  Hematology   Anesthesia Other Findings   Reproductive/Obstetrics                        Anesthesia Physical Anesthesia Plan  ASA: III  Anesthesia Plan: General   Post-op Pain Management:    Induction: Intravenous  Airway Management Planned: Oral ETT  Additional Equipment:   Intra-op Plan:   Post-operative Plan: Extubation in OR  Informed Consent: I have reviewed the patients History and Physical, chart, labs and discussed the procedure including the risks, benefits and alternatives for the proposed anesthesia with the patient or authorized representative who has indicated his/her understanding and acceptance.   Dental advisory given  Plan Discussed with: CRNA, Anesthesiologist and Surgeon  Anesthesia Plan Comments: (See my anesthesia note.  Has goiter with right tracheal deviation.  CXR image in Epic for review.  Myra Gianotti, PA-C Plan routine monitors, GETA with VideoGlide intubation )       Anesthesia Quick Evaluation

## 2013-11-16 MED ORDER — CEFAZOLIN SODIUM-DEXTROSE 2-3 GM-% IV SOLR
2.0000 g | INTRAVENOUS | Status: AC
  Administered 2013-11-17 (×2): 2 g via INTRAVENOUS
  Filled 2013-11-16: qty 50

## 2013-11-17 ENCOUNTER — Inpatient Hospital Stay (HOSPITAL_COMMUNITY)
Admission: RE | Admit: 2013-11-17 | Discharge: 2013-11-23 | DRG: 460 | Disposition: A | Discharge status: Home / Self Care | Payer: Medicare FFS | Source: Ambulatory Visit | Attending: Neurosurgery | Admitting: Neurosurgery

## 2013-11-17 ENCOUNTER — Encounter (HOSPITAL_COMMUNITY): Payer: Self-pay | Admitting: *Deleted

## 2013-11-17 ENCOUNTER — Encounter (HOSPITAL_COMMUNITY): Payer: Medicare FFS | Admitting: Vascular Surgery

## 2013-11-17 ENCOUNTER — Encounter (HOSPITAL_COMMUNITY)
Admission: RE | Disposition: A | Discharge status: Home / Self Care | Payer: Medicare FFS | Source: Ambulatory Visit | Attending: Neurosurgery

## 2013-11-17 ENCOUNTER — Inpatient Hospital Stay (HOSPITAL_COMMUNITY): Payer: Medicare FFS | Admitting: Anesthesiology

## 2013-11-17 ENCOUNTER — Inpatient Hospital Stay (HOSPITAL_COMMUNITY): Payer: Medicare FFS

## 2013-11-17 DIAGNOSIS — I10 Essential (primary) hypertension: Secondary | ICD-10-CM | POA: Diagnosis present

## 2013-11-17 DIAGNOSIS — E119 Type 2 diabetes mellitus without complications: Secondary | ICD-10-CM | POA: Diagnosis present

## 2013-11-17 DIAGNOSIS — M4316 Spondylolisthesis, lumbar region: Secondary | ICD-10-CM

## 2013-11-17 DIAGNOSIS — Z981 Arthrodesis status: Secondary | ICD-10-CM

## 2013-11-17 DIAGNOSIS — Q762 Congenital spondylolisthesis: Secondary | ICD-10-CM

## 2013-11-17 DIAGNOSIS — M48061 Spinal stenosis, lumbar region without neurogenic claudication: Principal | ICD-10-CM | POA: Diagnosis present

## 2013-11-17 LAB — GLUCOSE, CAPILLARY
Glucose-Capillary: 121 mg/dL — ABNORMAL HIGH (ref 70–99)
Glucose-Capillary: 129 mg/dL — ABNORMAL HIGH (ref 70–99)
Glucose-Capillary: 125 mg/dL — ABNORMAL HIGH (ref 70–99)

## 2013-11-17 SURGERY — POSTERIOR LUMBAR FUSION 1 WITH HARDWARE REMOVAL
Anesthesia: General | Site: Back

## 2013-11-17 MED ORDER — HYDROMORPHONE HCL PF 1 MG/ML IJ SOLN
INTRAMUSCULAR | Status: AC
  Filled 2013-11-17: qty 1

## 2013-11-17 MED ORDER — EPHEDRINE SULFATE 50 MG/ML IJ SOLN
INTRAMUSCULAR | Status: AC
  Filled 2013-11-17: qty 1

## 2013-11-17 MED ORDER — SENNA 8.6 MG PO TABS
1.0000 | ORAL_TABLET | Freq: Two times a day (BID) | ORAL | Status: DC
  Administered 2013-11-17 – 2013-11-23 (×12): 8.6 mg via ORAL
  Filled 2013-11-17 (×13): qty 1

## 2013-11-17 MED ORDER — NEOSTIGMINE METHYLSULFATE 1 MG/ML IJ SOLN
INTRAMUSCULAR | Status: AC
  Filled 2013-11-17: qty 10

## 2013-11-17 MED ORDER — PROPOFOL 10 MG/ML IV BOLUS
INTRAVENOUS | Status: AC
  Filled 2013-11-17: qty 20

## 2013-11-17 MED ORDER — SUCCINYLCHOLINE CHLORIDE 20 MG/ML IJ SOLN
INTRAMUSCULAR | Status: AC
  Filled 2013-11-17: qty 1

## 2013-11-17 MED ORDER — VECURONIUM BROMIDE 10 MG IV SOLR
INTRAVENOUS | Status: DC | PRN
  Administered 2013-11-17 (×7): 1 mg via INTRAVENOUS

## 2013-11-17 MED ORDER — CEFAZOLIN SODIUM 1-5 GM-% IV SOLN
1.0000 g | Freq: Three times a day (TID) | INTRAVENOUS | Status: AC
  Administered 2013-11-17 – 2013-11-18 (×2): 1 g via INTRAVENOUS
  Filled 2013-11-17 (×2): qty 50

## 2013-11-17 MED ORDER — SODIUM CHLORIDE 0.9 % IJ SOLN: 3.0000 mL | INTRAMUSCULAR | Status: DC | PRN

## 2013-11-17 MED ORDER — METFORMIN HCL 500 MG PO TABS
500.0000 mg | ORAL_TABLET | Freq: Every day | ORAL | Status: DC
  Administered 2013-11-18 – 2013-11-23 (×6): 500 mg via ORAL
  Filled 2013-11-17 (×8): qty 1

## 2013-11-17 MED ORDER — ASPIRIN EC 81 MG PO TBEC
81.0000 mg | DELAYED_RELEASE_TABLET | Freq: Every day | ORAL | Status: DC
  Administered 2013-11-17 – 2013-11-22 (×6): 81 mg via ORAL
  Filled 2013-11-17 (×7): qty 1

## 2013-11-17 MED ORDER — NEOSTIGMINE METHYLSULFATE 1 MG/ML IJ SOLN
INTRAMUSCULAR | Status: DC | PRN
  Administered 2013-11-17: 4 mg via INTRAVENOUS

## 2013-11-17 MED ORDER — ALBUMIN HUMAN 5 % IV SOLN
INTRAVENOUS | Status: DC | PRN
  Administered 2013-11-17 (×2): via INTRAVENOUS

## 2013-11-17 MED ORDER — VECURONIUM BROMIDE 10 MG IV SOLR
INTRAVENOUS | Status: AC
  Filled 2013-11-17: qty 10

## 2013-11-17 MED ORDER — FLUTICASONE PROPIONATE 50 MCG/ACT NA SUSP
1.0000 | Freq: Every day | NASAL | Status: DC
  Administered 2013-11-17 – 2013-11-23 (×7): 1 via NASAL
  Filled 2013-11-17: qty 16

## 2013-11-17 MED ORDER — MORPHINE SULFATE 2 MG/ML IJ SOLN
1.0000 mg | INTRAMUSCULAR | Status: DC | PRN
  Administered 2013-11-17: 4 mg via INTRAVENOUS
  Administered 2013-11-18: 2 mg via INTRAVENOUS
  Administered 2013-11-18: 4 mg via INTRAVENOUS
  Administered 2013-11-22: 2 mg via INTRAVENOUS
  Filled 2013-11-17: qty 2
  Filled 2013-11-17 (×2): qty 1
  Filled 2013-11-17: qty 2

## 2013-11-17 MED ORDER — OXYCODONE HCL 5 MG PO TABS: 5.0000 mg | ORAL_TABLET | Freq: Once | ORAL | Status: DC | PRN

## 2013-11-17 MED ORDER — 0.9 % SODIUM CHLORIDE (POUR BTL) OPTIME
TOPICAL | Status: DC | PRN
  Administered 2013-11-17 (×2): 1000 mL

## 2013-11-17 MED ORDER — FENTANYL CITRATE 0.05 MG/ML IJ SOLN
INTRAMUSCULAR | Status: DC | PRN
  Administered 2013-11-17 (×2): 25 ug via INTRAVENOUS
  Administered 2013-11-17: 150 ug via INTRAVENOUS
  Administered 2013-11-17 (×4): 25 ug via INTRAVENOUS
  Administered 2013-11-17 (×2): 50 ug via INTRAVENOUS
  Administered 2013-11-17: 25 ug via INTRAVENOUS
  Administered 2013-11-17: 100 ug via INTRAVENOUS
  Administered 2013-11-17 (×3): 25 ug via INTRAVENOUS

## 2013-11-17 MED ORDER — PHENYLEPHRINE 40 MCG/ML (10ML) SYRINGE FOR IV PUSH (FOR BLOOD PRESSURE SUPPORT)
PREFILLED_SYRINGE | INTRAVENOUS | Status: AC
  Filled 2013-11-17: qty 10

## 2013-11-17 MED ORDER — LACTATED RINGERS IV SOLN
INTRAVENOUS | Status: DC | PRN
  Administered 2013-11-17 (×5): via INTRAVENOUS

## 2013-11-17 MED ORDER — GLYCOPYRROLATE 0.2 MG/ML IJ SOLN
INTRAMUSCULAR | Status: AC
  Filled 2013-11-17: qty 2

## 2013-11-17 MED ORDER — POTASSIUM CHLORIDE IN NACL 20-0.9 MEQ/L-% IV SOLN
INTRAVENOUS | Status: DC
  Administered 2013-11-17: 1000 mL via INTRAVENOUS
  Administered 2013-11-18: 16:00:00 via INTRAVENOUS
  Administered 2013-11-18: 1000 mL via INTRAVENOUS
  Filled 2013-11-17 (×6): qty 1000

## 2013-11-17 MED ORDER — ROCURONIUM BROMIDE 100 MG/10ML IV SOLN
INTRAVENOUS | Status: DC | PRN
  Administered 2013-11-17: 50 mg via INTRAVENOUS

## 2013-11-17 MED ORDER — ARTIFICIAL TEARS OP OINT
TOPICAL_OINTMENT | OPHTHALMIC | Status: DC | PRN
  Administered 2013-11-17: 1 via OPHTHALMIC

## 2013-11-17 MED ORDER — SODIUM CHLORIDE 0.9 % IJ SOLN
3.0000 mL | Freq: Two times a day (BID) | INTRAMUSCULAR | Status: DC
  Administered 2013-11-17 – 2013-11-18 (×2): 3 mL via INTRAVENOUS

## 2013-11-17 MED ORDER — OXYCODONE HCL 5 MG/5ML PO SOLN: 5.0000 mg | Freq: Once | ORAL | Status: DC | PRN

## 2013-11-17 MED ORDER — SIMVASTATIN 10 MG PO TABS
10.0000 mg | ORAL_TABLET | Freq: Every day | ORAL | Status: DC
  Administered 2013-11-18 – 2013-11-22 (×5): 10 mg via ORAL
  Filled 2013-11-17 (×6): qty 1

## 2013-11-17 MED ORDER — OXYCODONE-ACETAMINOPHEN 5-325 MG PO TABS
1.0000 | ORAL_TABLET | ORAL | Status: DC | PRN
  Administered 2013-11-18 – 2013-11-19 (×5): 2 via ORAL
  Administered 2013-11-20 – 2013-11-21 (×5): 1 via ORAL
  Administered 2013-11-21 – 2013-11-23 (×5): 2 via ORAL
  Filled 2013-11-17 (×3): qty 2
  Filled 2013-11-17: qty 1
  Filled 2013-11-17 (×2): qty 2
  Filled 2013-11-17: qty 1
  Filled 2013-11-17 (×2): qty 2
  Filled 2013-11-17 (×2): qty 1
  Filled 2013-11-17 (×2): qty 2
  Filled 2013-11-17 (×3): qty 1

## 2013-11-17 MED ORDER — ACETAMINOPHEN 325 MG PO TABS
650.0000 mg | ORAL_TABLET | ORAL | Status: DC | PRN
  Administered 2013-11-19 – 2013-11-20 (×2): 650 mg via ORAL
  Filled 2013-11-17 (×2): qty 2

## 2013-11-17 MED ORDER — ACETAMINOPHEN 650 MG RE SUPP: 650.0000 mg | RECTAL | Status: DC | PRN

## 2013-11-17 MED ORDER — ONDANSETRON HCL 4 MG/2ML IJ SOLN
INTRAMUSCULAR | Status: DC | PRN
  Administered 2013-11-17: 4 mg via INTRAVENOUS

## 2013-11-17 MED ORDER — STERILE WATER FOR INJECTION IJ SOLN
INTRAMUSCULAR | Status: AC
  Filled 2013-11-17: qty 10

## 2013-11-17 MED ORDER — HYDROMORPHONE HCL PF 1 MG/ML IJ SOLN
0.2500 mg | INTRAMUSCULAR | Status: DC | PRN
  Administered 2013-11-17 (×4): 0.5 mg via INTRAVENOUS

## 2013-11-17 MED ORDER — MIDAZOLAM HCL 2 MG/2ML IJ SOLN
INTRAMUSCULAR | Status: AC
  Filled 2013-11-17: qty 2

## 2013-11-17 MED ORDER — MENTHOL 3 MG MT LOZG: 1.0000 | LOZENGE | OROMUCOSAL | Status: DC | PRN

## 2013-11-17 MED ORDER — MIDAZOLAM HCL 5 MG/5ML IJ SOLN
INTRAMUSCULAR | Status: DC | PRN
  Administered 2013-11-17 (×2): 1 mg via INTRAVENOUS

## 2013-11-17 MED ORDER — ROCURONIUM BROMIDE 50 MG/5ML IV SOLN
INTRAVENOUS | Status: AC
  Filled 2013-11-17: qty 1

## 2013-11-17 MED ORDER — PROMETHAZINE HCL 25 MG/ML IJ SOLN: 6.2500 mg | INTRAMUSCULAR | Status: DC | PRN

## 2013-11-17 MED ORDER — GLYCOPYRROLATE 0.2 MG/ML IJ SOLN
INTRAMUSCULAR | Status: DC | PRN
  Administered 2013-11-17: 0.6 mg via INTRAVENOUS

## 2013-11-17 MED ORDER — FENTANYL CITRATE 0.05 MG/ML IJ SOLN
INTRAMUSCULAR | Status: AC
  Filled 2013-11-17: qty 5

## 2013-11-17 MED ORDER — LIDOCAINE-EPINEPHRINE 0.5 %-1:200000 IJ SOLN
INTRAMUSCULAR | Status: DC | PRN
  Administered 2013-11-17: 19 mL

## 2013-11-17 MED ORDER — PANTOPRAZOLE SODIUM 40 MG PO TBEC
40.0000 mg | DELAYED_RELEASE_TABLET | Freq: Every day | ORAL | Status: DC
  Administered 2013-11-17 – 2013-11-23 (×7): 40 mg via ORAL
  Filled 2013-11-17 (×5): qty 1

## 2013-11-17 MED ORDER — SUCCINYLCHOLINE CHLORIDE 20 MG/ML IJ SOLN
INTRAMUSCULAR | Status: DC | PRN
  Administered 2013-11-17: 100 mg via INTRAVENOUS

## 2013-11-17 MED ORDER — LIDOCAINE HCL (CARDIAC) 20 MG/ML IV SOLN
INTRAVENOUS | Status: DC | PRN
  Administered 2013-11-17: 50 mg via INTRAVENOUS

## 2013-11-17 MED ORDER — PROPOFOL 10 MG/ML IV BOLUS
INTRAVENOUS | Status: DC | PRN
  Administered 2013-11-17: 100 mg via INTRAVENOUS

## 2013-11-17 MED ORDER — THROMBIN 20000 UNITS EX SOLR
CUTANEOUS | Status: DC | PRN
  Administered 2013-11-17: 08:00:00 via TOPICAL

## 2013-11-17 MED ORDER — MEPERIDINE HCL 25 MG/ML IJ SOLN: 6.2500 mg | INTRAMUSCULAR | Status: DC | PRN

## 2013-11-17 MED ORDER — PHENYLEPHRINE HCL 10 MG/ML IJ SOLN
INTRAMUSCULAR | Status: DC | PRN
  Administered 2013-11-17 (×5): 80 ug via INTRAVENOUS

## 2013-11-17 MED ORDER — ONDANSETRON HCL 4 MG/2ML IJ SOLN: 4.0000 mg | INTRAMUSCULAR | Status: DC | PRN

## 2013-11-17 MED ORDER — PHENOL 1.4 % MT LIQD
1.0000 | OROMUCOSAL | Status: DC | PRN
  Administered 2013-11-18 (×2): 1 via OROMUCOSAL
  Filled 2013-11-17: qty 177

## 2013-11-17 MED ORDER — ARTIFICIAL TEARS OP OINT
TOPICAL_OINTMENT | OPHTHALMIC | Status: AC
  Filled 2013-11-17: qty 3.5

## 2013-11-17 MED ORDER — SODIUM CHLORIDE 0.9 % IV SOLN
250.0000 mL | INTRAVENOUS | Status: DC
  Administered 2013-11-17: 1000 mL via INTRAVENOUS

## 2013-11-17 MED ORDER — HYDROCODONE-ACETAMINOPHEN 5-325 MG PO TABS
1.0000 | ORAL_TABLET | ORAL | Status: DC | PRN
  Administered 2013-11-22 – 2013-11-23 (×3): 2 via ORAL
  Filled 2013-11-17 (×3): qty 2

## 2013-11-17 MED ORDER — SODIUM CHLORIDE 0.9 % IV SOLN
INTRAVENOUS | Status: DC | PRN
  Administered 2013-11-17: 14:00:00 via INTRAVENOUS

## 2013-11-17 MED ORDER — MIDAZOLAM HCL 2 MG/2ML IJ SOLN: 0.5000 mg | Freq: Once | INTRAMUSCULAR | Status: DC | PRN

## 2013-11-17 MED ORDER — BUPIVACAINE HCL (PF) 0.5 % IJ SOLN
INTRAMUSCULAR | Status: DC | PRN
  Administered 2013-11-17: 30 mL

## 2013-11-17 MED ORDER — ONDANSETRON HCL 4 MG/2ML IJ SOLN
INTRAMUSCULAR | Status: AC
  Filled 2013-11-17: qty 2

## 2013-11-17 MED ORDER — POLYETHYLENE GLYCOL 3350 17 G PO PACK
17.0000 g | PACK | Freq: Every day | ORAL | Status: DC | PRN
  Filled 2013-11-17: qty 1

## 2013-11-17 MED ORDER — CEFAZOLIN SODIUM-DEXTROSE 2-3 GM-% IV SOLR
INTRAVENOUS | Status: AC
  Filled 2013-11-17: qty 50

## 2013-11-17 SURGICAL SUPPLY — 77 items
ADH SKN CLS APL DERMABOND .7 (GAUZE/BANDAGES/DRESSINGS) ×1
ADH SKN CLS LQ APL DERMABOND (GAUZE/BANDAGES/DRESSINGS) ×1
APL SKNCLS STERI-STRIP NONHPOA (GAUZE/BANDAGES/DRESSINGS)
BAG DECANTER FOR FLEXI CONT (MISCELLANEOUS) ×3 IMPLANT
BENZOIN TINCTURE PRP APPL 2/3 (GAUZE/BANDAGES/DRESSINGS) IMPLANT
BLADE SURG ROTATE 9660 (MISCELLANEOUS) IMPLANT
BUR MATCHSTICK NEURO 3.0 LAGG (BURR) ×3 IMPLANT
BUR ROUND FLUTED 5 RND (BURR) ×2 IMPLANT
BUR ROUND FLUTED 5MM RND (BURR) ×1
CAGE COROENT MP 11X23X9 (Cage) ×6 IMPLANT
CANISTER SUCT 3000ML (MISCELLANEOUS) ×3 IMPLANT
CLOSURE WOUND 1/2 X4 (GAUZE/BANDAGES/DRESSINGS)
CONT SPEC 4OZ CLIKSEAL STRL BL (MISCELLANEOUS) ×6 IMPLANT
COVER BACK TABLE 24X17X13 BIG (DRAPES) IMPLANT
DECANTER SPIKE VIAL GLASS SM (MISCELLANEOUS) ×3 IMPLANT
DERMABOND ADHESIVE PROPEN (GAUZE/BANDAGES/DRESSINGS) ×2
DERMABOND ADVANCED (GAUZE/BANDAGES/DRESSINGS) ×2
DERMABOND ADVANCED .7 DNX12 (GAUZE/BANDAGES/DRESSINGS) ×1 IMPLANT
DERMABOND ADVANCED .7 DNX6 (GAUZE/BANDAGES/DRESSINGS) ×1 IMPLANT
DRAPE C-ARM 42X72 X-RAY (DRAPES) ×6 IMPLANT
DRAPE C-ARMOR (DRAPES) ×3 IMPLANT
DRAPE LAPAROTOMY 100X72X124 (DRAPES) ×3 IMPLANT
DRAPE POUCH INSTRU U-SHP 10X18 (DRAPES) ×3 IMPLANT
DRAPE SURG 17X23 STRL (DRAPES) ×3 IMPLANT
DRESSING TELFA 8X3 (GAUZE/BANDAGES/DRESSINGS) IMPLANT
DRSG OPSITE POSTOP 4X8 (GAUZE/BANDAGES/DRESSINGS) ×3 IMPLANT
DURAPREP 26ML APPLICATOR (WOUND CARE) ×3 IMPLANT
ELECT BLADE 4.0 EZ CLEAN MEGAD (MISCELLANEOUS) ×3
ELECT REM PT RETURN 9FT ADLT (ELECTROSURGICAL) ×3
ELECTRODE BLDE 4.0 EZ CLN MEGD (MISCELLANEOUS) ×1 IMPLANT
ELECTRODE REM PT RTRN 9FT ADLT (ELECTROSURGICAL) ×1 IMPLANT
GAUZE SPONGE 4X4 16PLY XRAY LF (GAUZE/BANDAGES/DRESSINGS) ×3 IMPLANT
GLOVE BIO SURGEON STRL SZ8 (GLOVE) ×3 IMPLANT
GLOVE ECLIPSE 6.5 STRL STRAW (GLOVE) ×6 IMPLANT
GLOVE ECLIPSE 7.5 STRL STRAW (GLOVE) ×3 IMPLANT
GLOVE EXAM NITRILE LRG STRL (GLOVE) ×3 IMPLANT
GLOVE EXAM NITRILE MD LF STRL (GLOVE) IMPLANT
GLOVE EXAM NITRILE XL STR (GLOVE) IMPLANT
GLOVE EXAM NITRILE XS STR PU (GLOVE) IMPLANT
GLOVE INDICATOR 7.5 STRL GRN (GLOVE) ×6 IMPLANT
GLOVE SURG SS PI 7.0 STRL IVOR (GLOVE) ×12 IMPLANT
GOWN BRE IMP SLV AUR LG STRL (GOWN DISPOSABLE) IMPLANT
GOWN BRE IMP SLV AUR XL STRL (GOWN DISPOSABLE) ×3 IMPLANT
GOWN STRL REIN 2XL LVL4 (GOWN DISPOSABLE) IMPLANT
GOWN STRL REUS W/ TWL LRG LVL3 (GOWN DISPOSABLE) ×3 IMPLANT
GOWN STRL REUS W/ TWL XL LVL3 (GOWN DISPOSABLE) ×2 IMPLANT
GOWN STRL REUS W/TWL LRG LVL3 (GOWN DISPOSABLE) ×9
GOWN STRL REUS W/TWL XL LVL3 (GOWN DISPOSABLE) ×6
KIT BASIN OR (CUSTOM PROCEDURE TRAY) ×3 IMPLANT
KIT POSITION SURG JACKSON T1 (MISCELLANEOUS) ×3 IMPLANT
KIT ROOM TURNOVER OR (KITS) ×3 IMPLANT
MILL MEDIUM DISP (BLADE) ×3 IMPLANT
NEEDLE HYPO 25X1 1.5 SAFETY (NEEDLE) ×3 IMPLANT
NEEDLE SPNL 18GX3.5 QUINCKE PK (NEEDLE) IMPLANT
NS IRRIG 1000ML POUR BTL (IV SOLUTION) ×3 IMPLANT
PACK LAMINECTOMY NEURO (CUSTOM PROCEDURE TRAY) ×3 IMPLANT
PAD ARMBOARD 7.5X6 YLW CONV (MISCELLANEOUS) ×9 IMPLANT
ROD PRE BENT 65MM (Rod) ×3 IMPLANT
ROD PREBENT 60MM (Rod) ×3 IMPLANT
SCREW LOCK (Screw) ×18 IMPLANT
SCREW LOCK 100X5.5X OPN (Screw) ×6 IMPLANT
SCREW POLY 5.5X40MM (Screw) ×3 IMPLANT
SCREW POLY 5.5X45MM (Screw) ×9 IMPLANT
SCREW POLY ARM15T 5.5X55MM (Screw) ×6 IMPLANT
SPONGE GAUZE 4X4 12PLY (GAUZE/BANDAGES/DRESSINGS) IMPLANT
SPONGE LAP 4X18 X RAY DECT (DISPOSABLE) ×3 IMPLANT
SPONGE SURGIFOAM ABS GEL 100 (HEMOSTASIS) ×3 IMPLANT
STRIP CLOSURE SKIN 1/2X4 (GAUZE/BANDAGES/DRESSINGS) IMPLANT
SUT PROLENE 6 0 BV (SUTURE) IMPLANT
SUT VIC AB 0 CT1 18XCR BRD8 (SUTURE) ×2 IMPLANT
SUT VIC AB 0 CT1 8-18 (SUTURE) ×4
SUT VIC AB 2-0 CT1 18 (SUTURE) ×6 IMPLANT
SUT VIC AB 3-0 SH 8-18 (SUTURE) ×6 IMPLANT
SYR 20ML ECCENTRIC (SYRINGE) ×3 IMPLANT
TOWEL OR 17X24 6PK STRL BLUE (TOWEL DISPOSABLE) ×3 IMPLANT
TOWEL OR 17X26 10 PK STRL BLUE (TOWEL DISPOSABLE) ×3 IMPLANT
WATER STERILE IRR 1000ML POUR (IV SOLUTION) ×3 IMPLANT

## 2013-11-17 NOTE — Progress Notes (Signed)
Notified Dr. Glennon Mac of Bp 153/104, patient not taking meds (none for 2 years).  Patient states her pain level is 10/10, Dr. Glennon Mac will evaluate Bp in Or.

## 2013-11-17 NOTE — H&P (Signed)
BP 153/104  Pulse 107  Temp(Src) 97.6 F (36.4 C) (Oral)  Resp 20  SpO2 98%  [name] comes in today at [place] of her niece and nephew, Mr. Rich Fuchs and another patient of mine.  She has a very long history of low back pain.  She underwent the lumbar fusion at L4-5 in 1997 at [facility].  She said she remembers being told that a screw is broken and that may be the fusion did not take, but she has had persistent back pain.  She feels however that her left lower extremity has been getting worse over the last few years.  She has a great deal of numbness and tingling in it.  She is 68 years of age and currently I believe is not working.  She says that the back pain will wax and wane and has done so over years, but in her own words the left lower extremity pain is now real bad.     On the pain chart, she lists pain in the left lower extremity, some pain in the right thigh with significant amount of tingling in the left lower extremity.  She has some pain in the back of the neck and then in the lower back.  She states that has been getting worse since June.     REVIEW OF SYSTEMS:                                    Positive for eye glasses, cataract, sinus problems, sinus headache, leg pain with walking and at rest, leg weakness, back pain, joint pain, arthritis, diabetes, thyroid disease.  Denying allergic, hematologic, psychiatric, neurological, skin, genitourinary, gastrointestinal, respiratory problems.  She has had some thyroid problems that she has never followed up with it.  She did undergo a thyroid scan.     PAST MEDICAL HISTORY:                                             Current Medical Conditions:  Significant for hypertension and diabetes.              Prior Operations:  She only had the one operation performed by Dr. Ysidro Evert.              Medications and Allergies:  She has no known drug allergies.  Current medications are aspirin, Flovent, lovastatin,  metformin, omeprazole, Tylenol 8 Hour.     FAMILY HISTORY:                                            She is providing no family history.     SOCIAL HISTORY:                                            She does not smoke.  She does not use alcohol.  She does not use illicit drugs.     PHYSICAL EXAMINATION:  She is 5 feet 3 inches tall, weighs 168.8 pounds.  BMI 29.9.  Blood pressure is 143/97.  Pulse is 103.  She is alert and oriented x4 and answers all questions appropriately.  She admits slight weakness in the abductors of the left hip.  Plantar flexion and dorsiflexion of 5/5, hamstrings and quadriceps 5/5, hip flexion appears to be normal slightly going to the left than the right.  Upper extremity strength is normal.  Proprioception is intact in both the upper and lower extremities.  2+ reflexes in biceps, triceps, brachioradialis, knees, right ankle; not elicited at the left ankle.  She has normal muscle tone, bulk, and coordination.  Speech is clear and fluent.  Memory, language, attention span, and fund of knowledge are normal.  She has pupils, which are equal, round, and reactive to light.  She has full extraocular movement and full visual fields.  She has symmetric facial sensation and movement.  Her hearing is intact to voice and finger rub bilaterally.  Uvula does elevate in the midline.  Should shrug is normal and the tongue protrudes in the midline.     DATA:                                                              Plain x-rays I have done today show listhesis of 3 on 4, which does move between flexion and extension and some slight listhesis of 2 on 3 also.  She appears to have a good posterolateral arthrodesis, but does have broken hardware of the inferior screw 4-5.  She only has hardware on the right side and those are pedicle screws.     DIAGNOSES:                                                    Pseudoarthrosis, low back pain, spondylolisthesis  lumbar spine, lumbar radiculopathy.   Kristina Huang returned today so that we could go over the MRI and plain CT of the lumbar spine. While she does have the broken hardware on the right side at 4-5, she has a solid posterolateral arthrodesis and does not have a pseudoarthrosis. She has listhesis anteriorly of 3 on 4 causing very severe stenosis and fairly profound lateral recess stenosis, especially on the left. The right side is also bad, but not nearly as bad as the left. At 2-3 she is not severely stenotic. She has some stenosis, but it was over read on the CT. She has some mild listhesis of 2 on 3 at that level.                 IMPRESSION/PLAN:                             Mrs. Handley, at this point, says that she is in enough pain that she would like to proceed with some sort of operation. I do not think physical therapy or other conservative measures are going to do much of anything at the 3-4 level. She has a listhesis' unstable as seen on the plain x-rays. I think she does  need a decompression and extension of the arthrodesis 2, 3, 4. I will need to find out what the hardware is that was used at [facility]. Risks and benefits were explained. She has had the operation before and does understand. We will schedule her operation for April. She knows to contact me if she has any questions.

## 2013-11-17 NOTE — Anesthesia Postprocedure Evaluation (Signed)
  Anesthesia Post-op Note  Patient: Kristina Huang  Procedure(s) Performed: Procedure(s): POSTERIOR LUMBAR INTERBODY FUSION POSTERIOR LATERAL ARTHRODESIS POSTERIOR NONSEGMENTAL INSTRUMENTATION  LUMBAR TWO-THREE ,LUMBAR THREE-FOUR WITH HARDWARE REMOVAL LUMBAR FOUR-FIVE (N/A)  Patient Location: PACU  Anesthesia Type:General  Level of Consciousness: awake  Airway and Oxygen Therapy: Patient Spontanous Breathing  Post-op Pain: mild  Post-op Assessment: Post-op Vital signs reviewed, Patient's Cardiovascular Status Stable, Respiratory Function Stable, Patent Airway, No signs of Nausea or vomiting and Pain level controlled  Post-op Vital Signs: Reviewed and stable  Last Vitals:  Filed Vitals:   11/17/13 2045  BP:   Pulse: 97  Temp:   Resp: 11    Complications: No apparent anesthesia complications

## 2013-11-17 NOTE — Op Note (Signed)
11/17/2013  5:38 PM  PATIENT:  Kristina Huang  68 y.o. female  PRE-OPERATIVE DIAGNOSIS:  Spondylolisthesis Ll3/4,2/3 pseudoarthrosis, lumbar stenosis L3/4,4/5, Lumbar radiculopathy  low back pain  POST-OPERATIVE DIAGNOSIS:  Spondylolisthesis Ll3/4,2/3 pseudoarthrosis, lumbar stenosis L3/4,4/5, Lumbar radiculopathy  low back pain  PROCEDURE:  Procedure(s): POSTERIOR LUMBAR INTERBODY FUSION  L2/3, 69mm Peek cage morselized autograft, POSTERIOR LATERAL ARTHRODESIS L2-4, morselized autograft  POSTERIOR NONSEGMENTAL INSTRUMENTATION  LUMBAR TWO-THREE ,LUMBAR THREE-FOUR Nuvasive pedicle screws [place]   HARDWARE REMOVAL LUMBAR FOUR-FIVE  SURGEON:  Surgeon(s): Winfield Cunas, MD Eustace Moore, MD  ASSISTANTS:Jones, Shanon Brow  ANESTHESIA:   general  EBL:  Total I/O In: 5100 [I.V.:4500; Blood:100; IV Piggyback:500] Out: 53 [Urine:350; Blood:500]  BLOOD ADMINISTERED:500 CC CELLSAVER  CELL SAVER GIVEN:yes  COUNT:per nursing  DRAINS: none   SPECIMEN:  No Specimen  DICTATION: Kristina Huang is a 68 y.o. female whom was taken to the operating room intubated, and placed under a general anesthetic without difficulty. A foley catheter was placed under sterile conditions. She was positioned prone on a Jackson stable with all pressure points properly padded.  Her lumbar region was prepped and draped in a sterile manner. I infiltrated 20cc's 1/2%lidocaine/1:2000,000 strength epinephrine into the planned incision. I opened the skin with a 10 blade and took the incision down to the thoracolumbar fascia. I exposed the lamina of L2,3,4 and 5 in a subperiosteal fashion bilaterally. I confirmed my location with an intraoperative xray. I then removed the existing pedicle screws on the right side at L4 and L5. The L5 screw was broken, and I did not attempt to remove the part lodged in the bone.  I placed self retaining retractors and started the decompression.   I decompressed the spinal canal from L2-4  via complete laminectomies of L3, and most of L2. A complete facetectomy was performed of the inferior facets of L2,and L3 to decompress the L2, and L3 nerve roots. At L2/3 this exposure was well beyond the exposure needed for the L2/3 Plif. I completed the lumbar decompression of the L3 roots through the lateral recess and in the extraforaminal space. No Plif at 3/4 due to the listhesis and the proximity of the nerve root to the disc space. I did not believe the distraction of the L3 roots in order to place cages would be of great benefit since her problem was stenosis.    I also had to drill through the calcified ligamentum flavum at the L3/4 disc space. The dura was quite compressed through this portion of the previous fusion she had undergone.    A PLIF was  performed at L2/3. I opened the disc space with a 15 blade then used a variety of instruments to remove the disc and prepare the space for the arthrodesis. I used curettes, rongeurs, punches, shavers for the disc space, and rasps in the discetomy. I measured the disc space and placed 2 24mmx23mmx4degree  Peek cages(nuvasive) into the disc space(s). I placed morselized autograft anterior to the cages at the 2/3 level.   I decorticated the lateral bone at L2,3 and 4. I then placed morselized autograft on the decorticated surfaces to complete the posterolateral arthrodesis.   Dr. Ronnald Ramp and I placed pedicle screws at L2,3,and L4, using fluoroscopic guidance. I drilled a pilot hole, then cannulated the pedicle with a bone probe at each site. I then tapped each pedicle, assessing each site for pedicle violations. No cutouts were appreciated. Screws Harlin Heys, [place]) were then placed at each site  without difficulty. Final films were performed and all screws appeared to be in good position.  We closed the wound in a layered fashion. We approximated the thoracolumbar fascia, subcutaneous, and subcuticular planes with vicryl sutures. I used dermabond for a  sterile dressing.     PLAN OF CARE: Admit to inpatient   PATIENT DISPOSITION:  PACU - hemodynamically stable.   Delay start of Pharmacological VTE agent (>24hrs) due to surgical blood loss or risk of bleeding:  yes

## 2013-11-17 NOTE — Transfer of Care (Signed)
Immediate Anesthesia Transfer of Care Note  Patient: Kristina Huang  Procedure(s) Performed: Procedure(s): POSTERIOR LUMBAR INTERBODY FUSION POSTERIOR LATERAL ARTHRODESIS POSTERIOR NONSEGMENTAL INSTRUMENTATION  LUMBAR TWO-THREE ,LUMBAR THREE-FOUR WITH HARDWARE REMOVAL LUMBAR FOUR-FIVE (N/A)  Patient Location: PACU  Anesthesia Type:General  Level of Consciousness: sedated  Airway & Oxygen Therapy: Patient Spontanous Breathing and Patient connected to face mask oxygen  Post-op Assessment: Report given to PACU RN and Post -op Vital signs reviewed and stable  Post vital signs: Reviewed and stable  Complications: No apparent anesthesia complications

## 2013-11-18 LAB — GLUCOSE, CAPILLARY: Glucose-Capillary: 165 mg/dL — ABNORMAL HIGH (ref 70–99)

## 2013-11-18 MED ORDER — CEFAZOLIN SODIUM 1-5 GM-% IV SOLN
1.0000 g | Freq: Three times a day (TID) | INTRAVENOUS | Status: DC
  Administered 2013-11-18 – 2013-11-20 (×6): 1 g via INTRAVENOUS
  Filled 2013-11-18 (×7): qty 50

## 2013-11-18 NOTE — Progress Notes (Signed)
I unintentionally released 2 consent orders from my work list. I applligize

## 2013-11-18 NOTE — Clinical Social Work Note (Signed)
CSW received consult for possible SNF placement at time of discharge. Pt is to be discharged home with home health services. CSW signing off.  Pati Gallo, [place] Social Worker [phone]

## 2013-11-18 NOTE — Progress Notes (Signed)
Patients dressing is is totally coved with sanguineus drainage but is not showing any sign of leakage, patients foley was removed and she was able to get out of bed and sit on bed side commode.

## 2013-11-18 NOTE — Progress Notes (Signed)
Talked to patient about [facility] choices/ patient requested [facility] for HHPT/ OT; also orders faxed to [facility] for DME needs; Debbie with [place] called for arrangements; Aneta Mins [phone]

## 2013-11-18 NOTE — Evaluation (Signed)
Occupational Therapy Evaluation Patient Details Name: Kristina Huang MRN: [id number] DOB: [birth year] Today's Date: 11/18/2013    History of Present Illness Patient is s/p POSTERIOR LUMBAR INTERBODY FUSION POSTERIOR LATERAL ARTHRODESIS POSTERIOR NONSEGMENTAL INSTRUMENTATION  LUMBAR TWO-THREE ,LUMBAR THREE-FOUR WITH HARDWARE REMOVAL LUMBAR FOUR-FIVE    Clinical Impression   Pt admitted with above. She demonstrates the below listed deficits and will benefit from continued OT to maximize safety and independence with BADLs.  Pt requires min cues for back precautions and is min A for BADLs.  Anticipate good progress.  Will follow acutely.      Follow Up Recommendations  No OT follow up;Supervision - Intermittent    Equipment Recommendations  3 in 1 bedside comode;Tub/shower bench    Recommendations for Other Services       Precautions / Restrictions Precautions Precautions: Back Precaution Booklet Issued: Yes (comment) Precaution Comments: Pt able to state 2/3 back precautions.  Requires min cues to adhere to them during ADLs Required Braces or Orthoses:  (on order, safe for mobilization without brace today per nsg)      Mobility Bed Mobility Overal bed mobility: Needs Assistance Bed Mobility: Sidelying to Sit;Sit to Sidelying   Sidelying to sit: Min guard     Sit to sidelying: Min guard General bed mobility comments: with use of bedrail  Transfers Overall transfer level: Needs assistance Equipment used: Rolling walker (2 wheeled) Transfers: Sit to/from [facility] Sit to Stand: Min guard Stand pivot transfers: Min guard       General transfer comment: vc's for hand placement    Balance Overall balance assessment: No apparent balance deficits (not formally assessed)                                          ADL Overall ADL's : Needs assistance/impaired Eating/Feeding: Independent;Sitting   Grooming: Wash/dry hands;Wash/dry  face;Applying deodorant;Brushing hair;Standing (min guard assist)   Upper Body Bathing: Minimal assitance;Sitting   Lower Body Bathing: Minimal assistance;Sit to/from stand   Upper Body Dressing : Minimal assistance;Sitting   Lower Body Dressing: Minimal assistance;Sit to/from stand Lower Body Dressing Details (indicate cue type and reason): able to cross ankles over knees to access feet Toilet Transfer: Min guard;Ambulation;BSC   Toileting- Clothing Manipulation and Hygiene: Minimal assistance;Sit to/from stand Toileting - Clothing Manipulation Details (indicate cue type and reason): Pt tends to bend forward requiring cues to correct     Functional mobility during ADLs: Min guard;Rolling walker       Vision                     Perception     Praxis      Pertinent Vitals/Pain 10/10 low back/incision.  RN notified.      Hand Dominance Right   Extremity/Trunk Assessment Upper Extremity Assessment Upper Extremity Assessment: Overall WFL for tasks assessed   Lower Extremity Assessment Lower Extremity Assessment: Defer to PT evaluation   Cervical / Trunk Assessment Cervical / Trunk Assessment: Normal   Communication     Cognition Arousal/Alertness: Awake/alert Behavior During Therapy: WFL for tasks assessed/performed Overall Cognitive Status: Within Functional Limits for tasks assessed                     General Comments       Exercises       Shoulder Instructions  Home Living Family/patient expects to be discharged to:: Private residence Living Arrangements: Alone   Type of Home: House Home Access: Stairs to enter Technical brewer of Steps: 1 Entrance Stairs-Rails: None Home Layout: One level     Bathroom Shower/Tub: Tub/shower unit;Curtain Shower/tub characteristics: Architectural technologist: Standard     Home Equipment: None          Prior Functioning/Environment Level of Independence: Independent              OT Diagnosis: Generalized weakness;Acute pain   OT Problem List: Decreased strength;Decreased activity tolerance;Decreased knowledge of use of DME or AE;Decreased knowledge of precautions;Pain   OT Treatment/Interventions: Self-care/ADL training;DME and/or AE instruction;Therapeutic activities;Patient/family education    OT Goals(Current goals can be found in the care plan section) Acute Rehab OT Goals Patient Stated Goal: to go home OT Goal Formulation: With patient Time For Goal Achievement: 11/25/13 Potential to Achieve Goals: Good ADL Goals Pt Will Perform Grooming: with supervision;standing Pt Will Perform Upper Body Bathing: with supervision;sitting Pt Will Perform Lower Body Bathing: with supervision;sit to/from stand Pt Will Perform Upper Body Dressing: with supervision;sitting Pt Will Perform Lower Body Dressing: with supervision;sit to/from stand Pt Will Transfer to Toilet: with supervision;regular height toilet;bedside commode;grab bars;ambulating Pt Will Perform Toileting - Clothing Manipulation and hygiene: with supervision;sit to/from stand Pt Will Perform Tub/Shower Transfer: Tub transfer;with supervision;ambulating;tub bench;rolling walker  OT Frequency: Min 2X/week   Barriers to D/C:            Co-evaluation              End of Session Equipment Utilized During Treatment: Rolling walker;Back brace Nurse Communication: Mobility status;Patient requests pain meds  Activity Tolerance: Patient tolerated treatment well Patient left: in bed;with call bell/phone within reach;with bed alarm set;with nursing/sitter in room;with family/visitor present   Time: 8182-9937 OT Time Calculation (min): 28 min Charges:  OT General Charges $OT Visit: 1 Procedure OT Evaluation $Initial OT Evaluation Tier I: 1 Procedure OT Treatments $Self Care/Home Management : 8-22 mins $Therapeutic Activity: 8-22 mins G-Codes:    [name] M Ermal Haberer 12-09-13, 3:46 PM

## 2013-11-18 NOTE — Progress Notes (Signed)
Utilization review completed. Gibran Veselka, RN, BSN. 

## 2013-11-18 NOTE — Progress Notes (Signed)
Patient's dressing covered with sanginous drainage. Dressing changed and MD's office notified.

## 2013-11-18 NOTE — Evaluation (Signed)
Physical Therapy Evaluation Patient Details Name: Kristina Huang MRN: [id number] DOB: [birth year] Today's Date: 11/18/2013   History of Present Illness  Patient is s/p POSTERIOR LUMBAR INTERBODY FUSION POSTERIOR LATERAL ARTHRODESIS POSTERIOR NONSEGMENTAL INSTRUMENTATION  LUMBAR TWO-THREE ,LUMBAR THREE-FOUR WITH HARDWARE REMOVAL LUMBAR FOUR-FIVE   Clinical Impression  Patient demonstrates deficits in mobility as listed below. Will need acute PT to address deficits and maximize function and safety. Will see as indicated and progress as tolerated.    Follow Up Recommendations Home Health PT;Supervision/Assistance - 24 hour    Equipment Recommendations  Rolling walker with 5" wheels;3in1 (PT)    Recommendations for Other Services       Precautions / Restrictions Precautions Precautions: Back Precaution Booklet Issued: Yes (comment) Precaution Comments: educated patient and sister extensively regarding precautions Required Braces or Orthoses:  (on order, safe for mobilization without brace today per nsg)      Mobility  Bed Mobility Overal bed mobility: Needs Assistance Bed Mobility: Rolling;Sidelying to Sit Rolling: Min assist Sidelying to sit: Min assist       General bed mobility comments: VCs for technique  Transfers Overall transfer level: Needs assistance Equipment used: Rolling walker (2 wheeled) Transfers: Sit to/from [facility] Sit to Stand: Min guard Stand pivot transfers: Min guard       General transfer comment: VCs for hand placement and safety, guard for stability  Ambulation/Gait Ambulation/Gait assistance: Min guard Ambulation Distance (Feet): 24 Feet Assistive device: Rolling walker (2 wheeled) Gait Pattern/deviations: Step-to pattern;Decreased stride length;Narrow base of support Gait velocity: decreased significantly, cues for increased pace Gait velocity interpretation: Below normal speed for age/gender General Gait Details: VCs  for cadence and pacing, cues for proper use of rw  Stairs            Wheelchair Mobility    Modified Rankin (Stroke Patients Only)       Balance Overall balance assessment: No apparent balance deficits (not formally assessed)                                           Pertinent Vitals/Pain 4/10    Home Living Family/patient expects to be discharged to:: Private residence Living Arrangements: Alone   Type of Home: House Home Access: Stairs to enter Entrance Stairs-Rails: None Technical brewer of Steps: 1 Home Layout: One level Home Equipment: None      Prior Function Level of Independence: Independent               Hand Dominance   Dominant Hand: Right    Extremity/Trunk Assessment   Upper Extremity Assessment: Defer to OT evaluation           Lower Extremity Assessment: Generalized weakness         Communication      Cognition Arousal/Alertness: Awake/alert Behavior During Therapy: WFL for tasks assessed/performed Overall Cognitive Status: Within Functional Limits for tasks assessed                      General Comments General comments (skin integrity, edema, etc.): Hygiene and pericare performed, patient instructed and used anterior approach for hygiene post urination. Dynamic balance with RW min guard. Patient with increased cues for speed with mobility, at this time very cautious and slow.  Educated extensively on back precautions, mobility expectations, positioning and pain management.    Exercises  Assessment/Plan    PT Assessment Patient needs continued PT services  PT Diagnosis Difficulty walking;Abnormality of gait;Generalized weakness;Acute pain   PT Problem List Decreased strength;Decreased range of motion;Decreased activity tolerance;Decreased balance;Decreased mobility;Decreased knowledge of use of DME;Pain  PT Treatment Interventions DME instruction;Gait training;Stair  training;Functional mobility training;Therapeutic activities;Therapeutic exercise;Balance training;Patient/family education   PT Goals (Current goals can be found in the Care Plan section) Acute Rehab PT Goals Patient Stated Goal: to go home PT Goal Formulation: With patient/family Time For Goal Achievement: 12/02/13 Potential to Achieve Goals: Good    Frequency Min 5X/week   Barriers to discharge        Co-evaluation               End of Session Equipment Utilized During Treatment: Gait belt Activity Tolerance: Patient tolerated treatment well Patient left: in chair;with call bell/phone within reach;with chair alarm set;with family/visitor present Nurse Communication: Mobility status         Time: 1023-1049 PT Time Calculation (min): 26 min   Charges:   PT Evaluation $Initial PT Evaluation Tier I: 1 Procedure PT Treatments $Gait Training: 8-22 mins $Self Care/Home Management: 8-22   PT G Codes:          Duncan Dull 11/18/2013, 11:44 AM Alben Deeds, PT DPT  [phone]

## 2013-11-18 NOTE — Progress Notes (Signed)
Patient ID: Kristina Huang, female   DOB: [birth year], 68 y.o.   MRN: [id number] Alert and oriented x 4, speech is clear and fluent Moving lower extremities well Wound is draining bloody fluid from the most rostral portion Changed dressing  Will start antibiotics. If continued drainage tomorrow, will close wound with sutures Doing well, has been up to a chair.

## 2013-11-19 MED ORDER — LORAZEPAM 2 MG/ML IJ SOLN
2.0000 mg | Freq: Once | INTRAMUSCULAR | Status: AC
  Administered 2013-11-19: 2 mg via INTRAVENOUS

## 2013-11-19 MED ORDER — MORPHINE SULFATE 4 MG/ML IJ SOLN: 6.0000 mg | Freq: Once | INTRAMUSCULAR | Status: DC

## 2013-11-19 MED ORDER — MORPHINE SULFATE 4 MG/ML IJ SOLN
INTRAMUSCULAR | Status: AC
  Administered 2013-11-19: 6 mg
  Filled 2013-11-19: qty 2

## 2013-11-19 MED ORDER — LORAZEPAM 2 MG/ML IJ SOLN
INTRAMUSCULAR | Status: AC
  Filled 2013-11-19: qty 1

## 2013-11-19 MED FILL — Sodium Chloride Irrigation Soln 0.9%: Qty: 3000 | Status: AC

## 2013-11-19 MED FILL — Heparin Sodium (Porcine) Inj 1000 Unit/ML: INTRAMUSCULAR | Qty: 30 | Status: AC

## 2013-11-19 MED FILL — Sodium Chloride IV Soln 0.9%: INTRAVENOUS | Qty: 1000 | Status: AC

## 2013-11-19 NOTE — Progress Notes (Addendum)
Patient ID: Kristina Huang, female   DOB: [birth year], 68 y.o.   MRN: [id number] BP 117/63  Pulse 116  Temp(Src) 101.2 F (38.4 C) (Oral)  Resp 18  Ht 5\' 3"  (1.6 m)  Wt 75.841 kg (167 lb 3.2 oz)  BMI 29.63 kg/m2  SpO2 98% Alert and oriented x 4 Moving all extremities well Wound with drainage-after prepping her incision, and giving iv sedation I sutured that part of the wound which was draining under sterile conditions. Doing well overall.

## 2013-11-19 NOTE — Progress Notes (Signed)
Occupational Therapy Treatment Patient Details Name: IRLENE CRUDUP MRN: [id number] DOB: May 31, 1946 Today's Date: 11/19/2013    History of present illness Patient is s/p POSTERIOR LUMBAR INTERBODY FUSION POSTERIOR LATERAL ARTHRODESIS POSTERIOR NONSEGMENTAL INSTRUMENTATION  LUMBAR TWO-THREE ,LUMBAR THREE-FOUR WITH HARDWARE REMOVAL LUMBAR FOUR-FIVE    OT comments  Pt does not feel ready to go home today  Follow Up Recommendations  No OT follow up;Supervision - Intermittent    Equipment Recommendations  3 in 1 bedside comode;Tub/shower bench       Precautions / Restrictions Precautions Precautions: Back Precaution Booklet Issued: Yes (comment) Precaution Comments: Pt able to state 3/3 back precautions.  Requires min cues to adhere to them during ADLs Required Braces or Orthoses:  (on order, safe for mobilization without brace today per nsg)       Mobility Bed Mobility             Sit to sidelying: Min guard General bed mobility comments: with use of bedrail  Transfers Overall transfer level: Needs assistance Equipment used: Rolling walker (2 wheeled)   Sit to Stand: Min guard Stand pivot transfers: Min guard       General transfer comment: vc's for hand placement        ADL Overall ADL's : Needs assistance/impaired     Grooming: Set up;Sitting               Lower Body Dressing: Min guard;Sit to/from stand   Toilet Transfer: Min guard;RW           Functional mobility during ADLs: Min guard;Rolling walker                  Cognition   Behavior During Therapy: WFL for tasks assessed/performed Overall Cognitive Status: Within Functional Limits for tasks assessed                        Frequency Min 2X/week     Progress Toward Goals  OT Goals(current goals can now be found in the care plan section)     Acute Rehab OT Goals Patient Stated Goal: to go home OT Goal Formulation: With patient Time For Goal Achievement:  11/25/13 Potential to Achieve Goals: Good  Plan         End of Session Equipment Utilized During Treatment: Rolling walker;Back brace   Activity Tolerance Patient tolerated treatment well   Patient Left in bed;with call bell/phone within reach;with bed alarm set;with nursing/sitter in room;with family/visitor present   Nurse Communication Mobility status;Patient requests pain meds        Time: 6295-2841 OT Time Calculation (min): 23 min  Charges: OT General Charges $OT Visit: 1 Procedure OT Treatments $Self Care/Home Management : 23-37 mins  Betsy Pries 11/19/2013, 11:11 AM

## 2013-11-19 NOTE — Progress Notes (Signed)
Physical Therapy Treatment Patient Details Name: Kristina Huang MRN: [id number] DOB: Nov 01, 1945 Today's Date: 11/19/2013    History of Present Illness Patient is s/p POSTERIOR LUMBAR INTERBODY FUSION POSTERIOR LATERAL ARTHRODESIS POSTERIOR NONSEGMENTAL INSTRUMENTATION  LUMBAR TWO-THREE ,LUMBAR THREE-FOUR WITH HARDWARE REMOVAL LUMBAR FOUR-FIVE     PT Comments    Patient ambulated in hal and uopn return to chair reviewed with teach back spinal precautions. Patient with poor carry over of precautions. Verbalized 1/3 precautions. WIll continued to reinforce. Patient appears limited bty pain this am and unsure recpetivity to education because of this.  Follow Up Recommendations  Home health PT;Supervision/Assistance - 24 hour     Equipment Recommendations  Rolling walker with 5" wheels;3in1 (PT)    Recommendations for Other Services       Precautions / Restrictions Precautions Precautions: Back Precaution Booklet Issued: Yes (comment) Precaution Comments: educated patient and sister extensively regarding precautions Required Braces or Orthoses:  (on order, safe for mobilization without brace today per nsg)    Mobility  Bed Mobility             Sit to sidelying: Min guard General bed mobility comments: pt received in chair  Transfers Overall transfer level: Needs assistance Equipment used: Rolling walker (2 wheeled)   Sit to Stand: Min guard Stand pivot transfers: Min guard       General transfer comment: vc's for hand placement  Ambulation/Gait Ambulation/Gait assistance: Min guard Ambulation Distance (Feet): 120 Feet Assistive device: Rolling walker (2 wheeled) Gait Pattern/deviations: Step-to pattern;Decreased stride length;Narrow base of support Gait velocity: decreased significantly, cues for increased pace Gait velocity interpretation: Below normal speed for age/gender General Gait Details: VCs for cadence and pacing, cues for proper use of rw, patient  still very slow and cautious with ambulation   Stairs            Wheelchair Mobility    Modified Rankin (Stroke Patients Only)       Balance                                    Cognition Arousal/Alertness: Awake/alert Behavior During Therapy: WFL for tasks assessed/performed Overall Cognitive Status: Within Functional Limits for tasks assessed                      Exercises      General Comments General comments (skin integrity, edema, etc.): patient assist to [facility] from chair performing stand pivot, dynamic balance assessed with min guard for hygiene, patient then ambulated and uopn return to chair reviewed with teach back spinal precautions. Patient with poor carry over of precautions. Verbalized 1/3 precautions. WIll continued to reinforce. Patient appears limited bty pain this am and unsure recpetivity to education because of this.      Pertinent Vitals/Pain Patient reports 10/10 pain however NAD    Home Living                      Prior Function            PT Goals (current goals can now be found in the care plan section) Acute Rehab PT Goals Patient Stated Goal: to go home PT Goal Formulation: With patient/family Time For Goal Achievement: 12/02/13 Potential to Achieve Goals: Good Progress towards PT goals: Progressing toward goals    Frequency  Min 5X/week    PT Plan Current plan remains appropriate  Co-evaluation             End of Session Equipment Utilized During Treatment: Gait belt Activity Tolerance: Patient tolerated treatment well Patient left: in chair;with call bell/phone within reach;with chair alarm set;with family/visitor present     Time: 8338-2505 PT Time Calculation (min): 18 min  Charges:  $Gait Training: 8-22 mins                    G Codes:      Duncan Dull 20-Nov-2013, 11:47 AM Alben Deeds, PT DPT  [phone]

## 2013-11-20 NOTE — Progress Notes (Signed)
Physical Therapy Treatment Patient Details Name: Kristina Huang MRN: [id number] DOB: 23-Oct-1945 Today's Date: 11/20/2013    History of Present Illness Patient is s/p POSTERIOR LUMBAR INTERBODY FUSION POSTERIOR LATERAL ARTHRODESIS POSTERIOR NONSEGMENTAL INSTRUMENTATION  LUMBAR TWO-THREE ,LUMBAR THREE-FOUR WITH HARDWARE REMOVAL LUMBAR FOUR-FIVE     PT Comments    Patient making progress with mobility and gait.  Did well on stairs.  Continues to need cues to maintain back precautions.  Follow Up Recommendations  Home health PT;Supervision/Assistance - 24 hour     Equipment Recommendations  Rolling walker with 5" wheels;3in1 (PT)    Recommendations for Other Services       Precautions / Restrictions Precautions Precautions: Back Precaution Comments: Reviewed precautions with patient.  Reviewed sitting upright in chair (not reclined or on 1 hip as PT found patient). Required Braces or Orthoses: Spinal Brace (on order, safe for mobilization without brace today per nsg) Spinal Brace: Lumbar corset Restrictions Weight Bearing Restrictions: No    Mobility  Bed Mobility Overal bed mobility: Needs Assistance Bed Mobility: Sit to Supine       Sit to supine: Min guard   General bed mobility comments: Reviewed correct technique to move to sidelying from sitting.  Patient moved from sitting straight back to supine.  Reinforced maintaining back precautions and moving through sidelying for supine <> sit.  Transfers Overall transfer level: Needs assistance Equipment used: Rolling walker (2 wheeled) Transfers: Sit to/from Stand Sit to Stand: Min guard         General transfer comment: vc's for hand placement  Ambulation/Gait Ambulation/Gait assistance: Min guard Ambulation Distance (Feet): 150 Feet Assistive device: Rolling walker (2 wheeled) Gait Pattern/deviations: Step-through pattern;Decreased stride length Gait velocity: Decreased Gait velocity interpretation: Below  normal speed for age/gender General Gait Details: Verbal cues for safe use of RW.   Stairs Stairs: Yes Stairs assistance: Min guard Stair Management: One rail Right;Step to pattern;Forwards Number of Stairs: 3 General stair comments: Instructed patient on safe negotiation of stairs using step-to pattern.  Wheelchair Mobility    Modified Rankin (Stroke Patients Only)       Balance                                    Cognition Arousal/Alertness: Awake/alert Behavior During Therapy: WFL for tasks assessed/performed Overall Cognitive Status: Within Functional Limits for tasks assessed                      Exercises      General Comments        Pertinent Vitals/Pain Pain 10/10 with mobility    Home Living                      Prior Function            PT Goals (current goals can now be found in the care plan section) Progress towards PT goals: Progressing toward goals    Frequency  Min 5X/week    PT Plan Current plan remains appropriate    Co-evaluation             End of Session Equipment Utilized During Treatment: Gait belt;Back brace Activity Tolerance: Patient limited by pain;Patient limited by fatigue Patient left: in bed;with call bell/phone within reach;with family/visitor present     Time: 7564-3329 PT Time Calculation (min): 24 min  Charges:  $Gait Training: 23-37 mins  G Codes:      Despina Pole 11/20/2013, 10:56 AM Carita Pian. Sanjuana Kava, [place] Pager [phone]

## 2013-11-20 NOTE — Progress Notes (Signed)
Subjective: Patient reports improving slowly  Objective: Vital signs in last 24 hours: Temp:  [98.4 F (36.9 C)-101.2 F (38.4 C)] 98.4 F (36.9 C) (04/11 0936) Pulse Rate:  [56-117] 117 (04/11 0936) Resp:  [18] 18 (04/11 0936) BP: (96-134)/(46-82) 132/73 mmHg (04/11 0936) SpO2:  [96 %-100 %] 100 % (04/11 0936)  Intake/Output from previous day: 04/10 0701 - 04/11 0700 In: 500 [P.O.:500] Out: -  Intake/Output this shift:    Physical Exam: Strength full.  Dressing CDI.  Lab Results: No results found for this basename: WBC, HGB, HCT, PLT,  in the last 72 hours BMET No results found for this basename: NA, K, CL, CO2, GLUCOSE, BUN, CREATININE, CALCIUM,  in the last 72 hours  Studies/Results: No results found.  Assessment/Plan: Mobilizing slowly.  Does not yet feel ready to go home.  Continue PT today.    LOS: 3 days    Erline Levine, MD 11/20/2013, 11:04 AM

## 2013-11-21 NOTE — Progress Notes (Signed)
Physical Therapy Treatment Patient Details Name: Kristina Huang MRN: [id number] DOB: 02-24-1946 Today's Date: 12/16/13    History of Present Illness Patient is s/p POSTERIOR LUMBAR INTERBODY FUSION POSTERIOR LATERAL ARTHRODESIS POSTERIOR NONSEGMENTAL INSTRUMENTATION  LUMBAR TWO-THREE ,LUMBAR THREE-FOUR WITH HARDWARE REMOVAL LUMBAR FOUR-FIVE     PT Comments    Patient making gains with mobility and gait. Continues to need reminders regarding precautions.  Follow Up Recommendations  Home health PT;Supervision/Assistance - 24 hour     Equipment Recommendations  Rolling walker with 5" wheels;3in1 (PT)    Recommendations for Other Services       Precautions / Restrictions Precautions Precautions: Back Precaution Comments: Reviewed precautions.  Reinforced proper seated position and length of time to sit in chair (patient sat for > 2 hours this am). Required Braces or Orthoses: Spinal Brace (on order, safe for mobilization without brace today per nsg) Spinal Brace: Lumbar corset Restrictions Weight Bearing Restrictions: No    Mobility  Bed Mobility Overal bed mobility: Modified Independent Bed Mobility: Rolling;Sidelying to Sit Rolling: Modified independent (Device/Increase time) Sidelying to sit: Modified independent (Device/Increase time)       General bed mobility comments: Reveiwed correct technique.  Patient able to complete with use of bed rail.  Transfers Overall transfer level: Needs assistance Equipment used: Rolling walker (2 wheeled) Transfers: Sit to/from Stand Sit to Stand: Supervision         General transfer comment: vc's for hand placement  Ambulation/Gait Ambulation/Gait assistance: Supervision Ambulation Distance (Feet): 150 Feet Assistive device: Rolling walker (2 wheeled) Gait Pattern/deviations: Step-through pattern;Decreased stride length Gait velocity: Decreased Gait velocity interpretation: Below normal speed for age/gender General  Gait Details: Verbal cues for safe use of RW.   Stairs            Wheelchair Mobility    Modified Rankin (Stroke Patients Only)       Balance                                    Cognition Arousal/Alertness: Awake/alert Behavior During Therapy: WFL for tasks assessed/performed Overall Cognitive Status: Within Functional Limits for tasks assessed                      Exercises      General Comments        Pertinent Vitals/Pain States pain 10/10    Home Living                      Prior Function            PT Goals (current goals can now be found in the care plan section) Progress towards PT goals: Progressing toward goals    Frequency  Min 5X/week    PT Plan Current plan remains appropriate    Co-evaluation             End of Session Equipment Utilized During Treatment: Gait belt;Back brace Activity Tolerance: Patient tolerated treatment well;Patient limited by pain Patient left: in chair;with call bell/phone within reach     Time: 6568-1275 PT Time Calculation (min): 10 min  Charges:  $Gait Training: 8-22 mins                    G Codes:      Despina Pole 12-16-13, 12:54 PM Carita Pian. Sanjuana Kava, [place] Pager [phone]

## 2013-11-21 NOTE — Progress Notes (Signed)
Postop day 4. Patient reports that her pain is under better control but still feels that her pain is significant and that she would improve with 1 more day of therapy before discharge home.  Currently afebrile. Low-grade temperature elevation last night. Urine output good. Patient is awake and alert. She is oriented and appropriate. Her motor and sensory function are intact. Abdomen is soft and nontender.  Progressing slowly and steadily. Probable discharge home tomorrow.

## 2013-11-22 NOTE — Progress Notes (Signed)
Occupational Therapy Treatment Patient Details Name: Kristina Huang MRN: [id number] DOB: 1946/07/11 Today's Date: 11/22/2013    History of present illness Patient is s/p POSTERIOR LUMBAR INTERBODY FUSION POSTERIOR LATERAL ARTHRODESIS POSTERIOR NONSEGMENTAL INSTRUMENTATION  LUMBAR TWO-THREE ,LUMBAR THREE-FOUR WITH HARDWARE REMOVAL LUMBAR FOUR-FIVE    OT comments  Pt progressing well with therapy however with poor recall of back precautions. Next session to focus on tub bench transfer.   Follow Up Recommendations  No OT follow up;Supervision - Intermittent    Equipment Recommendations  3 in 1 bedside comode;Tub/shower bench (pt reports pending delivery at home of DME)    Recommendations for Other Services      Precautions / Restrictions Precautions Precautions: Back Precaution Comments: reviewed back precautions ( 1 out 3 recalled) Required Braces or Orthoses: Spinal Brace Spinal Brace: Lumbar corset       Mobility Bed Mobility Overal bed mobility: Modified Independent Bed Mobility: Supine to Sit     Supine to sit: Modified independent (Device/Increase time)        Transfers Overall transfer level: Needs assistance Equipment used: Rolling walker (2 wheeled) Transfers: Sit to/from Stand Sit to Stand: Supervision         General transfer comment: v/c for safety    Balance                                   ADL       Grooming: Wash/dry hands;Wash/dry face;Oral care;Supervision/safety;Cueing for safety   Upper Body Bathing: Supervision/ safety;Sitting   Lower Body Bathing: Min guard;Cueing for safety;Sit to/from stand       Lower Body Dressing: Supervision/safety;Sitting/lateral leans (able to cross bil Le)   Toilet Transfer: Supervision/safety;Comfort height toilet;RW           Functional mobility during ADLs: Min guard;Rolling walker General ADL Comments: pt reeducated on back precautions and return demo with adl task. pt reports  numbness Lt foot at the 3rd 4th 5th toes only. Pt states "it was my whole left side now its down to that foot" pt needed cues for back precautions with LB bathing. pt positioned in chair and correctly reports change of position every 45 minutes from memory. pt don brace mod I this session.      Vision                     Perception     Praxis      Cognition   Behavior During Therapy: WFL for tasks assessed/performed Overall Cognitive Status: Within Functional Limits for tasks assessed                       Extremity/Trunk Assessment               Exercises     Shoulder Instructions       General Comments      Pertinent Vitals/ Pain       Reports pain and RN contacted for pain medication Pt reports last pain medication at [place]                                          Prior Functioning/Environment              Frequency Min 2X/week     Progress  Toward Goals  OT Goals(current goals can now be found in the care plan section)  Progress towards OT goals: Progressing toward goals  Acute Rehab OT Goals Patient Stated Goal: to go home OT Goal Formulation: With patient Time For Goal Achievement: 11/25/13 Potential to Achieve Goals: Good ADL Goals Pt Will Perform Grooming: with supervision;standing Pt Will Perform Upper Body Bathing: with supervision;sitting Pt Will Perform Lower Body Bathing: with supervision;sit to/from stand Pt Will Perform Upper Body Dressing: with supervision;sitting Pt Will Perform Lower Body Dressing: with supervision;sit to/from stand Pt Will Transfer to Toilet: with supervision;regular height toilet;bedside commode;grab bars;ambulating Pt Will Perform Toileting - Clothing Manipulation and hygiene: with supervision;sit to/from stand Pt Will Perform Tub/Shower Transfer: Tub transfer;with supervision;ambulating;tub bench;rolling walker  Plan Discharge plan remains appropriate     Co-evaluation                 End of Session     Activity Tolerance Patient tolerated treatment well   Patient Left in chair;with call bell/phone within reach   Nurse Communication Mobility status;Precautions        Time: 2876-8115 OT Time Calculation (min): 24 min  Charges: OT General Charges $OT Visit: 1 Procedure OT Treatments $Self Care/Home Management : 23-37 mins  Peri Maris 11/22/2013, 9:41 AM Pager: [phone]

## 2013-11-22 NOTE — Progress Notes (Signed)
Physical Therapy Treatment Patient Details Name: Kristina Huang MRN: [id number] DOB: 1945/09/10 Today's Date: 11/22/2013    History of Present Illness Patient is s/p POSTERIOR LUMBAR INTERBODY FUSION POSTERIOR LATERAL ARTHRODESIS POSTERIOR NONSEGMENTAL INSTRUMENTATION  LUMBAR TWO-THREE ,LUMBAR THREE-FOUR WITH HARDWARE REMOVAL LUMBAR FOUR-FIVE     PT Comments    Patient reports being limited by pain with activity.  Patient is ambulating well without assist.  Patient with some poor compliance with precautions and safety. Educated and reinforced throughout session.   Follow Up Recommendations  Home health PT;Supervision/Assistance - 24 hour     Equipment Recommendations  Rolling walker with 5" wheels;3in1 (PT)    Recommendations for Other Services       Precautions / Restrictions Precautions Precautions: Back Precaution Comments: reviewed back precautions ( 1 out 3 recalled) Required Braces or Orthoses: Spinal Brace Spinal Brace: Lumbar corset Restrictions Weight Bearing Restrictions: No    Mobility  Bed Mobility Overal bed mobility: Modified Independent Bed Mobility: Supine to Sit     Supine to sit: Modified independent (Device/Increase time)        Transfers Overall transfer level: Needs assistance Equipment used: Rolling walker (2 wheeled) Transfers: Sit to/from Stand Sit to Stand: Supervision         General transfer comment: Continues to require VCs for hand placement and safety  Ambulation/Gait Ambulation/Gait assistance: Supervision Ambulation Distance (Feet): 420 Feet Assistive device: Rolling walker (2 wheeled) Gait Pattern/deviations: Step-through pattern;Decreased stride length Gait velocity: Decreased Gait velocity interpretation: Below normal speed for age/gender General Gait Details: VCs for upright posture, postioning within RW and increased cadence   Stairs            Wheelchair Mobility    Modified Rankin (Stroke Patients  Only)       Balance Overall balance assessment: No apparent balance deficits (not formally assessed)                                  Cognition Arousal/Alertness: Awake/alert Behavior During Therapy: WFL for tasks assessed/performed Overall Cognitive Status: Within Functional Limits for tasks assessed                      Exercises      General Comments General comments (skin integrity, edema, etc.): reviewed back precuaitons with patient as she continues to have poor compliance with some activities. Educated patient on importance on not twisting to talk to people. Also assisted patient with proper donning and positioning of brace. No apparent balance deficits (not formally assessed)  patient also performed transfers from commode and hygiene without physical assist.  Reinforced precautions during self care.       Pertinent Vitals/Pain Patient reports increased pain, but no value given. NAD throughout session    Home Living                      Prior Function            PT Goals (current goals can now be found in the care plan section) Acute Rehab PT Goals Patient Stated Goal: to go home PT Goal Formulation: With patient/family Time For Goal Achievement: 12/02/13 Potential to Achieve Goals: Good Progress towards PT goals: Progressing toward goals    Frequency  Min 5X/week    PT Plan Current plan remains appropriate    Co-evaluation  End of Session Equipment Utilized During Treatment: Gait belt;Back brace Activity Tolerance: Patient limited by pain;Patient limited by fatigue Patient left: in bed;with call bell/phone within reach;with family/visitor present (seated EOB)     Time: 8206-0156 PT Time Calculation (min): 24 min  Charges:  $Gait Training: 8-22 mins $Self Care/Home Management: 04-24-2023                    G Codes:      Duncan Dull Dec 13, 2013, 11:53 AM Alben Deeds, PT DPT  [phone]

## 2013-11-22 NOTE — Progress Notes (Signed)
Patient ID: Kristina Huang, female   DOB: 01-28-1946, 68 y.o.   MRN: [id number] BP 100/48  Pulse 95  Temp(Src) 98.8 F (37.1 C) (Oral)  Resp 18  Ht 5\' 3"  (1.6 m)  Wt 75.841 kg (167 lb 3.2 oz)  BMI 29.63 kg/m2  SpO2 98% Alert and oriented x 4, speech clear and fluent Moving all extremities well Wound is clean, dry, no signs drainage or infection. Will discharge tomorrow

## 2013-11-23 NOTE — Progress Notes (Signed)
Physical Therapy Treatment Patient Details Name: Kristina Huang MRN: [id number] DOB: 10/12/1945 Today's Date: 11/23/2013    History of Present Illness Patient is s/p POSTERIOR LUMBAR INTERBODY FUSION POSTERIOR LATERAL ARTHRODESIS POSTERIOR NONSEGMENTAL INSTRUMENTATION  LUMBAR TWO-THREE ,LUMBAR THREE-FOUR WITH HARDWARE REMOVAL LUMBAR FOUR-FIVE     PT Comments    Patient progressing well with mobility, pain well controlled, patient ambulated increased distance, performed simulated care transfer, stair negotiation, verbalized 3/3 precautions and was receptive to education re: home mobility expectations, safety, and compliance.   Follow Up Recommendations  Home health PT;Supervision/Assistance - 24 hour     Equipment Recommendations  Rolling walker with 5" wheels;3in1 (PT)    Recommendations for Other Services       Precautions / Restrictions Precautions Precautions: Back Precaution Comments: reviewed back precautions ( 1 out 3 recalled) Required Braces or Orthoses: Spinal Brace Spinal Brace: Lumbar corset Restrictions Weight Bearing Restrictions: No    Mobility  Bed Mobility Overal bed mobility: Modified Independent Bed Mobility: Supine to Sit     Supine to sit: Modified independent (Device/Increase time)        Transfers Overall transfer level: Needs assistance Equipment used: Rolling walker (2 wheeled) Transfers: Sit to/from Stand Sit to Stand: Modified independent (Device/Increase time)         General transfer comment: Better hand placement with transfers today (performed from various surfaces without assist)  Ambulation/Gait Ambulation/Gait assistance: Modified independent (Device/Increase time) Ambulation Distance (Feet): 620 Feet Assistive device: Rolling walker (2 wheeled) Gait Pattern/deviations: Step-through pattern;Decreased stride length Gait velocity: improved   General Gait Details: good stability noted   Stairs Stairs: Yes Stairs  assistance: Supervision Stair Management: Two rails Number of Stairs: 5 General stair comments: No physical assist needed, VCs for sequencing   Wheelchair Mobility    Modified Rankin (Stroke Patients Only)       Balance Overall balance assessment: No apparent balance deficits (not formally assessed)                                  Cognition Arousal/Alertness: Awake/alert Behavior During Therapy: WFL for tasks assessed/performed Overall Cognitive Status: Within Functional Limits for tasks assessed                      Exercises      General Comments General comments (skin integrity, edema, etc.): patient able to recall 3/3 back precautions today. Patient educated on and performed simulated car transfer using shower bench positioned in front of wall, patient also educated on mobility expectations upon discharge. All questions answered regarding home concerns.      Pertinent Vitals/Pain 5/10    Home Living                      Prior Function            PT Goals (current goals can now be found in the care plan section) Acute Rehab PT Goals Patient Stated Goal: to go home PT Goal Formulation: With patient/family Time For Goal Achievement: 12/02/13 Potential to Achieve Goals: Good Progress towards PT goals: Progressing toward goals    Frequency  Min 5X/week    PT Plan Current plan remains appropriate    Co-evaluation             End of Session Equipment Utilized During Treatment: Gait belt;Back brace Activity Tolerance: Patient limited by pain;Patient limited by fatigue Patient left:  in chair;with call bell/phone within reach     Time: 0810-0833 PT Time Calculation (min): 23 min  Charges:  $Gait Training: 8-22 mins $Self Care/Home Management: May 03, 2023                    G Codes:      Duncan Dull 2013/12/23, 8:41 AM Alben Deeds, PT DPT  [phone]

## 2013-11-23 NOTE — Progress Notes (Signed)
Discharge instruction and follow  Up Md  Visit  Discussed with pt and pt demonstrated good understanding . Pt already talk with [place] carerepresentative to deliver DME  A tpt's  house.Marland Kitchen Pt awaiting sister to pick up her prescription from Dr Christella Noa office and pick pt up afterwards . Incision to back intact ,care instruction reinforced. Condition at discharge is stable.

## 2013-11-23 NOTE — Progress Notes (Signed)
Occupational Therapy Treatment Patient Details Name: VALERIA BOZA MRN: [id number] DOB: Oct 02, 1945 Today's Date: 11/23/2013    History of present illness Patient is s/p POSTERIOR LUMBAR INTERBODY FUSION POSTERIOR LATERAL ARTHRODESIS POSTERIOR NONSEGMENTAL INSTRUMENTATION  LUMBAR TWO-THREE ,LUMBAR THREE-FOUR WITH HARDWARE REMOVAL LUMBAR FOUR-FIVE    OT comments  Pt. Up in chair reports just finishing with PT prior to arrival.  Eager for D/C home.  Reports she is ambulating to/from b.room with out assistance.  Able to complete LB selfcare with S seated.  Declines tub transfer stating she will sponge bathe initially at home.  Moving well.  Denies pain. Able to don brace and state all back precautions.     Follow Up Recommendations  No OT follow up;Supervision - Intermittent    Equipment Recommendations  3 in 1 bedside comode;Tub/shower bench          Precautions / Restrictions Precautions Precautions: Back Precaution Comments: reviewed back precautions ( 1 out 3 recalled) Required Braces or Orthoses: Spinal Brace Spinal Brace: Lumbar corset Restrictions Weight Bearing Restrictions: No              ADL Overall ADL's : Needs assistance/impaired                     Lower Body Dressing: Supervision/safety;Sitting/lateral leans     Toilet Transfer Details (indicate cue type and reason): declined demonstration, reports transferring to/from b.room on her own throuhout day/night       Tub/Shower Transfer Details (indicate cue type and reason): declined tub/transfer stating she will sponge bathe initially.  reviewed types of DME with her in the event she changes her mind.  discussed bench vs. using 3-n-1 that she reports she will be getting for home   General ADL Comments: able to recall 3/3 back precautions.  declined tub transfer (see above).  able to complete LB dressing in sitting without need for AE. eager for d/c home                                      Cognition   Behavior During Therapy: [facility] for tasks assessed/performed Overall Cognitive Status: Within Functional Limits for tasks assessed                                                        Pertinent Vitals/ Pain      VSS, denies any pain                                                          Frequency Min 2X/week     Progress Toward Goals  OT Goals(current goals can now be found in the care plan section)  Progress towards OT goals: Progressing toward goals  Acute Rehab OT Goals Patient Stated Goal: to go home  Plan Discharge plan remains appropriate                     End of Session     Activity Tolerance Patient tolerated treatment well   Patient Left in chair;with call  bell/phone within reach             Time: 0830-0845 OT Time Calculation (min): 15 min  Charges: OT General Charges $OT Visit: 1 Procedure OT Treatments $Self Care/Home Management : 8-22 mins  Rico Junker Keoshia Steinmetz, COTA/L 11/23/2013, 9:14 AM

## 2013-11-23 NOTE — Progress Notes (Signed)
CM called [employer] ( tel # [phone]), they received the orders for DME and talked to the patient about delivery but all the patient wanted to talk about is her hospital bill; CM informed the rep for [employer] to call the patient again for delivery of the equipment so the patient can decide if she wants it delivered to the room or the hospital. Rep for [employer] stated " we have been trying to deliver the equipment since Friday". CM informed them that she is discharged from the hospital today - they will be in contact with the pt again; Mindi Slicker RN,BSN,MHA [phone]

## 2013-11-23 NOTE — Discharge Instructions (Signed)
Spinal Fusion Care After Refer to this sheet in the next few weeks. These instructions provide you with information on caring for yourself after your procedure. Your caregiver may also give you more specific instructions. Your treatment has been planned according to current medical practices, but problems sometimes occur. Call your caregiver if you have any problems or questions after your procedure. HOME CARE INSTRUCTIONS   Take whatever pain medicine has been prescribed by your caregiver. Do not take over-the-counter pain medicine unless directed otherwise by your caregiver.   Do not drive if you are taking narcotic pain medicines.   Change your bandage (dressing) if necessary or as directed by your caregiver.   You may shower. The wound may get wet, simply pat the area dry. It will take ~2 weeks for the glue to peel off.  If you have been prescribed medicine to prevent your blood from clotting, follow the directions carefully.   Check the area around your incision often. Look for redness and swelling. Also, look for anything leaking from your wound. You can use a mirror or have a family member inspect your incision if it is in a place where it is difficult for you to see.   Ask your caregiver what activities you should avoid and for how long.   Walk as much as possible.   Do not lift anything heavier than 5 lbs until your caregiver says it is safe.   Do not twist or bend for a few weeks. Try not to pull on things. Avoid sitting for long periods of time. Change positions at least every hour.  Spondylolisthesis with Rehab The slipping of one or multiple vertebrae out of the correct anatomical position is a condition known as spondylothesis. Spondylothesis is most common in adolescents and is caused by a number of different reasons, such as vertebral fracture or something you are born with (congenital). Spondylothesis is diagnosed with the use of x-rays. SYMPTOMS  Dull, achy pain in the  lower back. Pain that worsens with extension of the spine. Tightness of the (muscles on the back of the thigh). Lower back stiffness. Signs of nerve damage: pain, numbness, or weakness affecting one or both lower extremities. Muscle wasting (atrophy), uncommon. Loss of stool (bowel) or urine (bladder) function. CAUSES  The symptoms of spondylothesis are caused by one or more vertebrae that are out of alignment placing pressure on the spinal cord. Common mechanisms of injury include: Congenital defect of the spine. Degenerative process. Stress fracture of the spine. Fracture due to trauma to the spine. RISK INCREASES WITH: Activities that have a risk of hyper-extending the back. Activities that have a risk of excessively rotating the spine. Poor strength and flexibility. Failure to warm-up properly before activity. Family history of spondylolysis or spondylolisthesis. Improper sports technique. PREVENTION Warm up and stretch properly before activity. Allow for adequate recovery between workouts. Maintain physical fitness: Strength, flexibility, and endurance. Cardiovascular fitness. Learn and use proper technique. When possible, have a coach correct improper technique. PROGNOSIS  If treated properly, the spondylothesis usually resolves. RELATED COMPLICATIONS  Recurrent symptoms that result in a chronic problem. Inability to compete in athletics. Prolonged healing time, if improperly treated or re-injured. Failure of the fracture to heal (nonunion) Healing of the fracture in a poor position (malunion). TREATMENT Treatment initially involves resting from any activities that aggravate the symptoms, and the use of ice and medications to help reduce pain and inflammation. The use of strengthening and stretching exercises may help reduce pain with  activity. These exercises may be performed at home or with referral to a therapist. It is important to learn how to use proper body mechanics  as to not place undue stress on your spine. If the injury is severe, then your caregiver may recommend a back brace to allow for healing or even surgery. Surgery often involves fusing 2 adjacent vertebrae, so no movement is allowed between them.  MEDICATION  If pain medication is necessary, then nonsteroidal anti-inflammatory medications, such as aspirin and ibuprofen, or other minor pain relievers, such as acetaminophen, are often recommended. Do not take pain medication for 7 days before surgery. Prescription pain relievers may be given if deemed necessary by your caregiver. Use only as directed and only as much as you need. HEAT AND COLD Cold treatment (icing) relieves pain and reduces inflammation. Cold treatment should be applied for 10 to 15 minutes every 2 to 3 hours for inflammation and pain and immediately after any activity that aggravates your symptoms. Use ice packs or massage the area with a piece of ice (ice massage). Heat treatment may be used prior to performing the stretching and strengthening activities prescribed by your caregiver, physical therapist, or athletic trainer. Use a heat pack or soak the injury in warm water. SEEK MEDICAL CARE IF: Treatment seems to offer no benefit, or the condition worsens. Any medications produce adverse side effects. Any complications from surgery occur: Pain, numbness, or coldness in the extremity operated upon. Discoloration of the nail beds (they become blue or gray) of the extremity operated upon. Signs of infections (fever, pain, inflammation, redness, or persistent bleeding). EXERCISES RANGE OF MOTION (ROM) AND STRETCHING EXERCISES - Spondylolisthesis Most people with low back pain will find that their symptoms worsen with either excessive bending forward (flexion) or arching at the low back (extension). The exercises which will help resolve your symptoms will focus on the opposite motion. Your physician, physical therapist or athletic  trainer will help you determine which exercises will be most helpful to resolve your low back pain. Do not complete any exercises without first consulting with your clinician. Discontinue any exercises which worsen your symptoms until you speak to your clinician. If you have pain, numbness or tingling which travels down into your buttocks, leg or foot, the goal of the therapy is for these symptoms to move closer to your back and eventually resolve. Occasionally, these leg symptoms will get better, but your low back pain may worsen; this is typically an indication of progress in your rehabilitation. Be certain to be very alert to any changes in your symptoms and the activities in which you participated in the 24 hours prior to the change. Sharing this information with your clinician will allow him/her to most efficiently treat your condition. These exercises may help you when beginning to rehabilitate your injury. Your symptoms may resolve with or without further involvement from your physician, physical therapist or athletic trainer. While completing these exercises, remember:  Restoring tissue flexibility helps normal motion to return to the joints. This allows healthier, less painful movement and activity. An effective stretch should be held for at least 30 seconds. A stretch should never be painful. You should only feel a gentle lengthening or release in the stretched tissue. FLEXION RANGE OF MOTION AND STRETCHING EXERCISES: STRETCH  Flexion, Single Knee to Chest Lie on a firm bed or floor with both legs extended in front of you. Keeping one leg in contact with the floor, bring your opposite knee to your chest. Hold  your leg in place by either grabbing behind your thigh or at your knee. Pull until you feel a gentle stretch in your low back. Hold __________ seconds. Slowly release your grasp and repeat the exercise with the opposite side. Repeat __________ times. Complete this exercise __________ times  per day.  STRETCH  Flexion, Double Knee to Chest Lie on a firm bed or floor with both legs extended in front of you. Keeping one leg in contact with the floor, bring your opposite knee to your chest. Tense your stomach muscles to support your back and then lift your other knee to your chest. Hold your legs in place by either grabbing behind your thighs or at your knees. Pull both knees toward your chest until you feel a gentle stretch in your low back. Hold __________ seconds. Tense your stomach muscles and slowly return one leg at a time to the floor. Repeat __________ times. Complete this exercise __________ times per day.  STRENGTHENING EXERCISES - Spondylolisthesis These exercises may help you when beginning to rehabilitate your injury. These exercises should be done near your "sweet spot." This is the neutral, low-back arch, somewhere between fully rounded and fully arched, that is your least painful position. When performed in this safe range of motion, these exercises can be used for people who have either a flexion or extension based injury. These exercises may resolve your symptoms with or without further involvement from your physician, physical therapist or athletic trainer. While completing these exercises, remember:  Muscles can gain both the endurance and the strength needed for everyday activities through controlled exercises. Complete these exercises as instructed by your physician, physical therapist or athletic trainer. Progress the resistance and repetitions only as guided. You may experience muscle soreness or fatigue, but the pain or discomfort you are trying to eliminate should never worsen during these exercises. If this pain does worsen, stop and make certain you are following the directions exactly. If the pain is still present after adjustments, discontinue the exercise until you can discuss the trouble with your clinician. STRENGTHENING Deep Abdominals, Pelvic Tilt  Lie on a  firm bed or floor. Keeping your legs in front of you, bend your knees so they are both pointed toward the ceiling and your feet are flat on the floor. Tense your lower abdominal muscles to press your low back into the floor. This motion will rotate your pelvis so that your tail bone is scooping upwards rather than pointing at your feet or into the floor. With a gentle tension and even breathing, hold this position for __________ seconds. Repeat __________ times. Complete this exercise __________ times per day.  STRENGTHENING  Abdominals, Crunches  Lie on a firm bed or floor. Keeping your legs in front of you, bend your knees so they are both pointed toward the ceiling and your feet are flat on the floor. Cross your arms over your chest. Slightly tip your chin down without bending your neck. Tense your abdominals and slowly lift your trunk high enough to just clear your shoulder blades. Lifting higher can put excessive stress on the low back and does not further strengthen your abdominal muscles. Control your return to the starting position. Repeat __________ times. Complete this exercise __________ times per day.  STRENGTHENING  Quadruped, Opposite UE/LE Lift  Assume a hands and knees position on a firm surface. Keep your hands under your shoulders and your knees under your hips. You may place padding under your knees for comfort. Find your neutral  spine and gently tense your abdominal muscles so that you can maintain this position. Your shoulders and hips should form a rectangle that is parallel with the floor and is not twisted. Keeping your trunk steady, lift your right hand no higher than your shoulder and then your left leg no higher than your hip. Make sure you are not holding your breath. Hold this position __________ seconds. Continuing to keep your abdominal muscles tense and your back steady, slowly return to your starting position. Repeat with the opposite arm and leg. Repeat __________  times. Complete this exercise __________ times per day.  STRENGTHENING  Lower Abdominals, Double Knee Lift Lie on a firm bed or floor. Keeping your legs in front of you, bend your knees so they are both pointed toward the ceiling and your feet are flat on the floor. Tense your abdominal muscles to brace your low back and slowly lift both of your knees until they come over your hips. Be certain not to hold your breath. Hold __________ seconds. Using your abdominal muscles, return to the starting position in a slow and controlled manner. Repeat __________ times. Complete this exercise __________ times per day.  POSTURE AND BODY MECHANICS CONSIDERATIONS - Spondylolisthesis Keeping correct posture when sitting, standing or completing your activities will reduce the stress put on different body tissues, allowing injured tissues a chance to heal and limiting painful experiences. The following are general guidelines for improved posture. Your physician or physical therapist will provide you with any instructions specific to your needs. While reading these guidelines, remember: The exercises prescribed by your provider will help you have the flexibility and strength to maintain correct postures. The correct posture provides the optimal environment for your joints to work. All of your joints have less wear and tear when properly supported by a spine with good posture. This means you will experience a healthier, less painful body. Correct posture must be practiced with all of your activities, especially prolonged sitting and standing. Correct posture is as important when doing repetitive low-stress activities (typing) as it is when doing a single heavy-load activity (lifting). PROPER SITTING POSTURE In order to minimize stress and discomfort on your spine, you must sit with correct posture. Sitting with good posture should be effortless for a healthy body. Returning to good posture is a gradual process. Many  people can work toward this most comfortably by using various supports until they have the flexibility and strength to maintain this posture on their own. When sitting with proper posture, your ears will fall over your shoulders and your shoulders will fall over your hips. You should use the back of the chair to support your upper back. Your low back will be in a neutral position, just slightly arched. You may place a small pillow or folded towel at the base of your low back for  support.  When working at a desk, create an environment that supports good, upright posture. Without extra support, muscles fatigue and lead to excessive strain on joints and other tissues. Keep these recommendations in mind: CHAIR: A chair should be able to slide under your desk when your back makes contact with the back of the chair. This allows you to work closely. The chair's height should allow your eyes to be level with the upper part of your monitor and your hands to be slightly lower than your elbows. BODY POSITION Your feet should make contact with the floor. If this is not possible, use a foot rest. Keep your  ears over your shoulders. This will reduce stress on your neck and low back. INCORRECT SITTING POSTURES If you are feeling tired and unable to assume a healthy sitting posture, do not slouch or slump. This puts excessive strain on your back tissues, causing more damage and pain. Healthier options include: Using more support, like a lumbar pillow. Switching tasks to something that requires you to be upright or walking. Taking a brief walk. Lying down to rest in a neutral-spine position.  CORRECT LIFTING TECHNIQUES DO:  Assume a wide stance. This will provide you more stability and the opportunity to get as close as possible to the object which you are lifting. Tense your abdominals to brace your spine; then bend at the knees and hips. Keeping your back locked in a neutral-spine position, lift using your leg  muscles. Lift with your legs, keeping your back straight. Test the weight of unknown objects before attempting to lift them. Try to keep your elbows locked down at your sides in order get the best strength from your shoulders when carrying an object. Always ask for help when lifting heavy or awkward objects. INCORRECT LIFTING TECHNIQUES DO NOT:  Lock your knees when lifting, even if it is a small object. Bend and twist. Pivot at your feet or move your feet when needing to change directions. Assume that you cannot safely pick up a paperclip without proper posture. Document Released: 07/29/2005 Document Revised: 10/21/2011 Document Reviewed: 11/10/2008 [facility] Patient Information 2014 [place], [place].

## 2013-11-23 NOTE — Progress Notes (Signed)
Orders faxed to [employer] to deliver DME; CM asked pt if she wanted the equipment delivered to her home or to her room, she could not decide at this time; Huey Romans ( tele # [phone]) will deliver to pt room prior to discharge home today; Aneta Mins [phone]

## 2013-12-01 NOTE — Discharge Summary (Signed)
Physician Discharge Summary  Patient ID: Kristina Huang MRN: [id number] DOB/AGE: April 29, 1946 68 y.o.  Admit date: 11/17/2013 Discharge date: 12/01/2013  Admission Diagnoses:Spondylolisthesis Ll3/4,2/3 pseudoarthrosis, lumbar stenosis L3/4,4/5, Lumbar radiculopathy  low back pain  Discharge Diagnoses: Spondylolisthesis Ll3/4,2/3 pseudoarthrosis, lumbar stenosis L3/4,4/5, Lumbar radiculopathy  low back pain  Active Problems:   Spondylolisthesis at L3-L4 level   Discharged Condition: good  Hospital Course: [name] was admitted and taken to the Operating room for an uncomplicated lumbar decompression, and fusion. Post op she did very well, ambulating, tolerating a regular diet, and voiding. At discharge her wound is clean, dry, and without signs of infection.   Consults: None  Significant Diagnostic Studies: none  Treatments: surgery: POSTERIOR LUMBAR INTERBODY FUSION  L2/3, 55mm Peek cage morselized autograft, POSTERIOR LATERAL ARTHRODESIS L2-4, morselized autograft  POSTERIOR NONSEGMENTAL INSTRUMENTATION  LUMBAR TWO-THREE ,LUMBAR THREE-FOUR Nuvasive pedicle screws [place]   HARDWARE REMOVAL LUMBAR FOUR-FIVE   Discharge Exam: Blood pressure 109/66, pulse 97, temperature 98.1 F (36.7 C), temperature source Oral, resp. rate 18, height 5\' 3"  (1.6 m), weight 75.841 kg (167 lb 3.2 oz), SpO2 100.00%. General appearance: alert, cooperative, appears stated age and mild distress Neurologic: Alert and oriented X 3, normal strength and tone. Normal symmetric reflexes. Normal coordination and gait  Disposition: 01-Home or Self Care  Discharge Orders   Future Orders Complete By Expires   Ambulatory referral to [facility]  As directed    Walker rolling  As directed        Medication List    STOP taking these medications       acetaminophen 650 MG CR tablet  Commonly known as:  TYLENOL      TAKE these medications       aspirin EC 81 MG tablet  Take 81 mg by mouth at bedtime.      fluticasone 50 MCG/ACT nasal spray  Commonly known as:  FLONASE  Place 1 spray into both nostrils daily.     lovastatin 20 MG tablet  Commonly known as:  MEVACOR  Take 20 mg by mouth at bedtime.     metFORMIN 500 MG tablet  Commonly known as:  GLUCOPHAGE  Take 500 mg by mouth daily with breakfast.     omeprazole 20 MG capsule  Commonly known as:  PRILOSEC  Take 20 mg by mouth daily.           Follow-up Information   Follow up with Alla Sloma L, MD In 3 weeks. (call office to make an appointment, come by office today to pick up your pain medication)    Specialty:  Neurosurgery   Contact information:   [address], [address]                         [address] [place] [address] [phone]       Signed: Winfield Cunas 12/01/2013, 9:55 AM

## 2014-05-26 ENCOUNTER — Other Ambulatory Visit (HOSPITAL_COMMUNITY): Payer: Self-pay | Admitting: Family Medicine

## 2014-05-26 DIAGNOSIS — Z1231 Encounter for screening mammogram for malignant neoplasm of breast: Secondary | ICD-10-CM

## 2014-06-02 ENCOUNTER — Emergency Department (HOSPITAL_COMMUNITY): Payer: Medicare FFS

## 2014-06-02 ENCOUNTER — Emergency Department (HOSPITAL_COMMUNITY)
Admission: EM | Admit: 2014-06-02 | Discharge: 2014-06-02 | Disposition: A | Discharge status: Home / Self Care | Payer: Medicare FFS | Attending: Emergency Medicine | Admitting: Emergency Medicine

## 2014-06-02 ENCOUNTER — Other Ambulatory Visit: Payer: Self-pay

## 2014-06-02 ENCOUNTER — Ambulatory Visit (HOSPITAL_COMMUNITY)
Admission: RE | Admit: 2014-06-02 | Discharge: 2014-06-02 | Disposition: A | Discharge status: Home / Self Care | Payer: Medicare FFS | Source: Ambulatory Visit | Attending: Family Medicine | Admitting: Family Medicine

## 2014-06-02 ENCOUNTER — Encounter (HOSPITAL_COMMUNITY): Payer: Self-pay | Admitting: Emergency Medicine

## 2014-06-02 DIAGNOSIS — M1990 Unspecified osteoarthritis, unspecified site: Secondary | ICD-10-CM | POA: Diagnosis not present

## 2014-06-02 DIAGNOSIS — E119 Type 2 diabetes mellitus without complications: Secondary | ICD-10-CM | POA: Diagnosis not present

## 2014-06-02 DIAGNOSIS — Z7952 Long term (current) use of systemic steroids: Secondary | ICD-10-CM | POA: Insufficient documentation

## 2014-06-02 DIAGNOSIS — Z1231 Encounter for screening mammogram for malignant neoplasm of breast: Secondary | ICD-10-CM | POA: Insufficient documentation

## 2014-06-02 DIAGNOSIS — K219 Gastro-esophageal reflux disease without esophagitis: Secondary | ICD-10-CM | POA: Diagnosis not present

## 2014-06-02 DIAGNOSIS — Z7982 Long term (current) use of aspirin: Secondary | ICD-10-CM | POA: Diagnosis not present

## 2014-06-02 DIAGNOSIS — R0789 Other chest pain: Secondary | ICD-10-CM | POA: Diagnosis not present

## 2014-06-02 DIAGNOSIS — R079 Chest pain, unspecified: Secondary | ICD-10-CM | POA: Diagnosis present

## 2014-06-02 DIAGNOSIS — Z79899 Other long term (current) drug therapy: Secondary | ICD-10-CM | POA: Insufficient documentation

## 2014-06-02 LAB — COMPREHENSIVE METABOLIC PANEL
ALK PHOS: 104 U/L (ref 39–117)
ALT: 21 U/L (ref 0–35)
AST: 25 U/L (ref 0–37)
Albumin: 3.8 g/dL (ref 3.5–5.2)
Anion gap: 13 (ref 5–15)
BUN: 9 mg/dL (ref 6–23)
CO2: 26 mEq/L (ref 19–32)
Calcium: 9.8 mg/dL (ref 8.4–10.5)
Chloride: 102 mEq/L (ref 96–112)
Creatinine, Ser: 0.7 mg/dL (ref 0.50–1.10)
GFR calc Af Amer: >90 mL/min (ref >90)
GFR calc non Af Amer: 87 mL/min — ABNORMAL LOW (ref >90)
Glucose, Bld: 135 mg/dL — ABNORMAL HIGH (ref 70–99)
Potassium: 3.7 mEq/L (ref 3.7–5.3)
SODIUM: 141 meq/L (ref 137–147)
TOTAL PROTEIN: 7.8 g/dL (ref 6.0–8.3)
Total Bilirubin: 0.5 mg/dL (ref 0.3–1.2)

## 2014-06-02 LAB — CBC WITH DIFFERENTIAL/PLATELET
BASOS PCT: 0 % (ref 0–1)
Basophils Absolute: 0 10*3/uL (ref 0.0–0.1)
EOS ABS: 0.2 10*3/uL (ref 0.0–0.7)
Eosinophils Relative: 3 % (ref 0–5)
HCT: 42.6 % (ref 36.0–46.0)
Hemoglobin: 14.5 g/dL (ref 12.0–15.0)
LYMPHS ABS: 2.2 10*3/uL (ref 0.7–4.0)
Lymphocytes Relative: 32 % (ref 12–46)
MCH: 31.2 pg (ref 26.0–34.0)
MCHC: 34 g/dL (ref 30.0–36.0)
MCV: 91.6 fL (ref 78.0–100.0)
Monocytes Absolute: 0.6 10*3/uL (ref 0.1–1.0)
Monocytes Relative: 8 % (ref 3–12)
NEUTROS PCT: 57 % (ref 43–77)
Neutro Abs: 3.9 10*3/uL (ref 1.7–7.7)
PLATELETS: 340 10*3/uL (ref 150–400)
RBC: 4.65 MIL/uL (ref 3.87–5.11)
RDW: 13.1 % (ref 11.5–15.5)
WBC: 6.9 10*3/uL (ref 4.0–10.5)

## 2014-06-02 LAB — TROPONIN I
Troponin I: <0.3 ng/mL (ref <0.30)
Troponin I: <0.3 ng/mL (ref <0.30)

## 2014-06-02 MED ORDER — TRAMADOL HCL 50 MG PO TABS: 50.0000 mg | ORAL_TABLET | Freq: Four times a day (QID) | ORAL | Status: DC | PRN

## 2014-06-02 MED ORDER — OXYCODONE-ACETAMINOPHEN 5-325 MG PO TABS
1.0000 | ORAL_TABLET | Freq: Once | ORAL | Status: AC
  Administered 2014-06-02: 1 via ORAL
  Filled 2014-06-02: qty 1

## 2014-06-02 NOTE — ED Provider Notes (Signed)
CSN: [id number]     Arrival date & time 06/02/14  0813 History  This chart was scribed for Kristina Diego, MD by Ludger Nutting, ED Scribe. This patient was seen in room APA18/APA18 and the patient's care was started 8:33 AM.    Chief Complaint  Patient presents with  . Chest Pain   The history is provided by the patient. No language interpreter was used.    HPI Comments: Kristina Huang is a 68 y.o. female with past medical history of GERD who presents to the Emergency Department complaining of 2 days of constant, waxing and waning right sided chest pain that radiates to under the right breast and to the right shoulder. She describes the pain as sharp and stabbing. Patient states this pain is chronic but has been worse for the past 2 days. She denies nausea, vomiting, SOB, diaphoresis.   PCP [facility]  Past Medical History  Diagnosis Date  . Hypercholesterolemia   . Acid reflux   . Diabetes mellitus without complication     fasting 100-130  . Arthritis   . 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 Back surgery      spinal fusion  . Cholecystectomy    . Tubal ligation    . Bunionectomy Bilateral    No family history on file. History  Substance Use Topics  . Smoking status: Never Smoker   . Smokeless tobacco: Not on file  . Alcohol Use: No   OB History   Grav Para Term Preterm Abortions TAB SAB Ect Mult Living                 Review of Systems  Constitutional: Negative for diaphoresis, appetite change and fatigue.  HENT: Negative for congestion, ear discharge and sinus pressure.   Eyes: Negative for discharge.  Respiratory: Negative for cough and shortness of breath.   Cardiovascular: Positive for chest pain.  Gastrointestinal: Negative for nausea, vomiting, abdominal pain and diarrhea.  Genitourinary: Negative for frequency and hematuria.  Musculoskeletal: Negative for  back pain.  Skin: Negative for rash.  Neurological: Negative for seizures and headaches.  Psychiatric/Behavioral: Negative for hallucinations.      Allergies  Review of patient's allergies indicates no known allergies.  Home Medications   Prior to Admission medications   Medication Sig Start Date End Date Taking? Authorizing Provider  aspirin EC 81 MG tablet Take 81 mg by mouth at bedtime.     Historical Provider, MD  fluticasone (FLONASE) 50 MCG/ACT nasal spray Place 1 spray into both nostrils daily.    Historical Provider, MD  lovastatin (MEVACOR) 20 MG tablet Take 20 mg by mouth at bedtime.    Historical Provider, MD  metFORMIN (GLUCOPHAGE) 500 MG tablet Take 500 mg by mouth daily with breakfast.    Historical Provider, MD  omeprazole (PRILOSEC) 20 MG capsule Take 20 mg by mouth daily.    Historical Provider, MD   BP 154/98  Pulse 104  Temp(Src) 97.4 F (36.3 C) (Oral)  Resp 20  Ht 5\' 3"  (1.6 m)  Wt 170 lb (77.111 kg)  BMI 30.12 kg/m2  SpO2 100% Physical Exam  Nursing note and vitals reviewed. Constitutional: She is oriented to person, place, and time. She appears well-developed.  HENT:  Head: Normocephalic.  Eyes: Conjunctivae and EOM are normal. No scleral icterus.  Neck: Neck supple. No thyromegaly present.  Cardiovascular: Normal  rate and regular rhythm.  Exam reveals no gallop and no friction rub.   No murmur heard. Pulmonary/Chest: No stridor. She has no wheezes. She has no rales. She exhibits tenderness (minor, right anterior chest tenderness).  Abdominal: She exhibits no distension. There is no tenderness. There is no rebound.  Musculoskeletal: Normal range of motion. She exhibits no edema.  Lymphadenopathy:    She has no cervical adenopathy.  Neurological: She is oriented to person, place, and time. She exhibits normal muscle tone. Coordination normal.  Skin: No rash noted. No erythema.  Psychiatric: She has a normal mood and affect. Her behavior is normal.     ED Course  Procedures (including critical care time)  DIAGNOSTIC STUDIES: Oxygen Saturation is 100% on RA, normal by my interpretation.    COORDINATION OF CARE: 8:36 AM Discussed treatment plan with pt at bedside and pt agreed to plan.   Labs Review Labs Reviewed - No data to display  Imaging Review No results found.   EKG Interpretation None      MDM   Final diagnoses:  None   Negative troponin,   Will tx symptoms and have pt follow up   The chart was scribed for me under my direct supervision.  I personally performed the history, physical, and medical decision making and all procedures in the evaluation of this patient.Kristina Diego, MD 06/02/14 [phone]

## 2014-06-02 NOTE — ED Notes (Signed)
Pt reports has chronic chest pain but has been worse the past 2 days.  Reports chest pain is in r chest, radiating under r breast and into r shoulder.  Reports got worse after eating some raw vegetables 2 days ago.  Denies any n/v or SOB.

## 2014-06-02 NOTE — Discharge Instructions (Signed)
Follow up with your md next week for recheck and to follow up on your goiter

## 2014-07-05 ENCOUNTER — Encounter: Payer: Self-pay | Admitting: Gastroenterology

## 2014-08-23 ENCOUNTER — Ambulatory Visit (INDEPENDENT_AMBULATORY_CARE_PROVIDER_SITE_OTHER): Payer: Medicare FFS | Admitting: Gastroenterology

## 2014-08-23 ENCOUNTER — Encounter: Payer: Self-pay | Admitting: Gastroenterology

## 2014-08-23 ENCOUNTER — Other Ambulatory Visit: Payer: Self-pay

## 2014-08-23 VITALS — BP 138/98 | HR 117 | Temp 97.4°F | Ht 63.0 in | Wt 170.0 lb

## 2014-08-23 DIAGNOSIS — K219 Gastro-esophageal reflux disease without esophagitis: Secondary | ICD-10-CM

## 2014-08-23 DIAGNOSIS — Z860100 Personal history of colon polyps, unspecified: Secondary | ICD-10-CM

## 2014-08-23 DIAGNOSIS — K529 Noninfective gastroenteritis and colitis, unspecified: Secondary | ICD-10-CM

## 2014-08-23 MED ORDER — PEG 3350-KCL-NA BICARB-NACL 420 G PO SOLR: 4000.0000 mL | Freq: Once | ORAL | Status: DC

## 2014-08-23 MED ORDER — DICYCLOMINE HCL 10 MG PO CAPS: 10.0000 mg | ORAL_CAPSULE | Freq: Three times a day (TID) | ORAL | Status: DC

## 2014-08-23 NOTE — Progress Notes (Signed)
Primary Care Physician:  Geroge Baseman Primary Gastroenterologist:  Dr. Oneida Alar   Chief Complaint  Patient presents with  . Colonoscopy  . EGD    HPI:   Kristina Huang is a 69 y.o. female presenting today at the request of Elin Claggett, PA, secondary to need for surveillance colonoscopy and evaluation for EGD. Last colonoscopy in 2009 with large adenoma otherwise hyperplastic polyps.    Notes RUQ pain that radiates around back occasionally. Chronic. Feels like changing diet has helped somewhat. Present for a few years. No true dysphagia. Has to take something to make her belch.   Takes Omeprazole each morning. Has atypical right-sided chest discomfort. Has a BM 4-5 times in the morning after having coffee. If she has to go anywhere, has to eat something early otherwise it tears her stomach up. Goes to bathroom maybe 10 times a day or more. Loose stool. Chronic since cholecystectomy. Metformin for at least 8-9 years.      Past Medical History  Diagnosis Date  . Hypercholesterolemia   . Acid reflux   . Diabetes mellitus without complication     fasting 100-130  . Arthritis   . 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 Back surgery      spinal fusion  . Cholecystectomy      Dr. Irving Shows  . Tubal ligation    . Bunionectomy Bilateral   . Colonoscopy  2009    Dr. Oneida Alar: moderate internal hemorrhoids, frequent sigmoid colon diverticula, polyps X 3, one large adenoma otherwise hyperplastic    Current Outpatient Prescriptions  Medication Sig Dispense Refill  . acetaminophen (TYLENOL) 650 MG CR tablet Take 1,300 mg by mouth daily as needed for pain.    Marland Kitchen aspirin EC 81 MG tablet Take 81 mg by mouth at bedtime.     . fluticasone (FLONASE) 50 MCG/ACT nasal spray Place 1 spray into both nostrils daily as needed for allergies.     Marland Kitchen lovastatin (MEVACOR) 20 MG tablet Take 20 mg by mouth  at bedtime.    . metFORMIN (GLUCOPHAGE) 500 MG tablet Take 500 mg by mouth daily with breakfast.    . omeprazole (PRILOSEC) 20 MG capsule Take 20 mg by mouth daily.    Marland Kitchen dicyclomine (BENTYL) 10 MG capsule Take 1 capsule (10 mg total) by mouth 4 (four) times daily -  before meals and at bedtime. 120 capsule 3  . polyethylene glycol-electrolytes (NULYTELY/GOLYTELY) 420 G solution Take 4,000 mLs by mouth once. 4000 mL 0   No current facility-administered medications for this visit.    Allergies as of 08/23/2014  . (No Known Allergies)    Family History  Problem Relation Age of Onset  . Colon cancer Neg Hx     History   Social History  . Marital Status: Divorced    Spouse Name: N/A    Number of Children: N/A  . Years of Education: N/A   Occupational History  . Not on file.   Social History Main Topics  . Smoking status: Never Smoker   . Smokeless tobacco: Not on file  . Alcohol Use: No  . Drug Use: No  . Sexual Activity: Not on file   Other Topics Concern  . Not on file   Social History Narrative    Review of Systems: As mentioned in HPI  Physical Exam: BP 138/98  mmHg  Pulse 117  Temp(Src) 97.4 F (36.3 C) (Oral)  Ht 5\' 3"  (1.6 m)  Wt 170 lb (77.111 kg)  BMI 30.12 kg/m2 General:   Alert and oriented. Pleasant and cooperative. Well-nourished and well-developed.  Head:  Normocephalic and atraumatic. Eyes:  Without icterus, sclera clear and conjunctiva pink.  Ears:  Normal auditory acuity. Nose:  No deformity, discharge,  or lesions. Mouth:  No deformity or lesions, oral mucosa pink.  Lungs:  Clear to auscultation bilaterally. No wheezes, rales, or rhonchi. No distress.  Heart:  S1, S2 present without murmurs appreciated.  Abdomen:  +BS, soft, non-tender and non-distended. No HSM noted. No guarding or rebound. No masses appreciated.  Rectal:  Deferred  Msk:  Symmetrical without gross deformities. Normal posture. Extremities:  Without clubbing or  edema. Neurologic:  Alert and  oriented x4;  grossly normal neurologically. Psych:  Alert and cooperative. Normal mood and affect.  Lab Results  Component Value Date   ALT 21 06/02/2014   AST 25 06/02/2014   ALKPHOS 104 06/02/2014   BILITOT 0.5 06/02/2014   Lab Results  Component Value Date   WBC 6.9 06/02/2014   HGB 14.5 06/02/2014   HCT 42.6 06/02/2014   MCV 91.6 06/02/2014   PLT 340 06/02/2014

## 2014-08-23 NOTE — Patient Instructions (Signed)
Start taking Bentyl 1 capsule with meals and at bedtime for loose stool and abdominal cramping. Monitor for constipation, dry mouth, dizziness   We have scheduled you for the colonoscopy and upper endoscopy with Dr. Oneida Alar in the near future.   Continue Prilosec (omeprazole) each morning 30 minutes before breakfast.

## 2014-08-26 ENCOUNTER — Encounter: Payer: Self-pay | Admitting: Gastroenterology

## 2014-08-26 NOTE — Assessment & Plan Note (Signed)
Trial of Bentyl. Consider discontinuing metformin (at PCP discretion).

## 2014-08-26 NOTE — Assessment & Plan Note (Signed)
Long-standing GERD symptoms with atypical right-sided chest discomfort, chronic RUQ pain, belching. Currently taking omeprazole each morning. No prior EGD. Query gastritis, PUD, esophagitis.   Proceed with upper endoscopy in the near future with Dr. Oneida Alar. The risks, benefits, and alternatives have been discussed in detail with patient. They have stated understanding and desire to proceed.  Continue Prilosec daily

## 2014-08-26 NOTE — Assessment & Plan Note (Signed)
69 year old female with history of large adenoma in 2009, now due for surveillance colonoscopy. No FH of colon cancer. Chronic diarrhea since cholecystectomy, non-bloody. Notes taking Metformin for at least 8-9 years, diarrhea present prior to starting. Trial of Bentyl; may need to discontinue metformin, as this could be exacerbating symptoms.   Proceed with colonoscopy with Dr. Oneida Alar in the near future. The risks, benefits, and alternatives have been discussed in detail with the patient. They state understanding and desire to proceed.

## 2014-08-29 NOTE — Progress Notes (Signed)
cc'ed to pcp °

## 2014-09-01 ENCOUNTER — Other Ambulatory Visit: Payer: Self-pay

## 2014-09-01 MED ORDER — PEG 3350-KCL-NA BICARB-NACL 420 G PO SOLR: 4000.0000 mL | Freq: Once | ORAL | Status: DC

## 2014-09-09 ENCOUNTER — Encounter (HOSPITAL_COMMUNITY): Payer: Self-pay | Admitting: *Deleted

## 2014-09-09 ENCOUNTER — Ambulatory Visit (HOSPITAL_COMMUNITY)
Admission: RE | Admit: 2014-09-09 | Discharge: 2014-09-09 | Disposition: A | Discharge status: Home / Self Care | Payer: Medicare FFS | Source: Ambulatory Visit | Attending: Gastroenterology | Admitting: Gastroenterology

## 2014-09-09 ENCOUNTER — Encounter (HOSPITAL_COMMUNITY)
Admission: RE | Disposition: A | Discharge status: Home / Self Care | Payer: Self-pay | Source: Ambulatory Visit | Attending: Gastroenterology

## 2014-09-09 DIAGNOSIS — E119 Type 2 diabetes mellitus without complications: Secondary | ICD-10-CM | POA: Diagnosis not present

## 2014-09-09 DIAGNOSIS — M1990 Unspecified osteoarthritis, unspecified site: Secondary | ICD-10-CM | POA: Insufficient documentation

## 2014-09-09 DIAGNOSIS — D125 Benign neoplasm of sigmoid colon: Secondary | ICD-10-CM | POA: Diagnosis not present

## 2014-09-09 DIAGNOSIS — D127 Benign neoplasm of rectosigmoid junction: Secondary | ICD-10-CM | POA: Insufficient documentation

## 2014-09-09 DIAGNOSIS — K2950 Unspecified chronic gastritis without bleeding: Secondary | ICD-10-CM | POA: Diagnosis not present

## 2014-09-09 DIAGNOSIS — D120 Benign neoplasm of cecum: Secondary | ICD-10-CM

## 2014-09-09 DIAGNOSIS — K5730 Diverticulosis of large intestine without perforation or abscess without bleeding: Secondary | ICD-10-CM | POA: Insufficient documentation

## 2014-09-09 DIAGNOSIS — K317 Polyp of stomach and duodenum: Secondary | ICD-10-CM | POA: Diagnosis not present

## 2014-09-09 DIAGNOSIS — E780 Pure hypercholesterolemia: Secondary | ICD-10-CM | POA: Diagnosis not present

## 2014-09-09 DIAGNOSIS — K2980 Duodenitis without bleeding: Secondary | ICD-10-CM | POA: Insufficient documentation

## 2014-09-09 DIAGNOSIS — Z86010 Personal history of colonic polyps: Secondary | ICD-10-CM | POA: Insufficient documentation

## 2014-09-09 DIAGNOSIS — K589 Irritable bowel syndrome without diarrhea: Secondary | ICD-10-CM | POA: Diagnosis not present

## 2014-09-09 DIAGNOSIS — D123 Benign neoplasm of transverse colon: Secondary | ICD-10-CM | POA: Insufficient documentation

## 2014-09-09 DIAGNOSIS — K648 Other hemorrhoids: Secondary | ICD-10-CM | POA: Insufficient documentation

## 2014-09-09 DIAGNOSIS — R1013 Epigastric pain: Secondary | ICD-10-CM | POA: Diagnosis present

## 2014-09-09 DIAGNOSIS — K449 Diaphragmatic hernia without obstruction or gangrene: Secondary | ICD-10-CM | POA: Insufficient documentation

## 2014-09-09 DIAGNOSIS — K219 Gastro-esophageal reflux disease without esophagitis: Secondary | ICD-10-CM | POA: Diagnosis not present

## 2014-09-09 DIAGNOSIS — K2970 Gastritis, unspecified, without bleeding: Secondary | ICD-10-CM

## 2014-09-09 HISTORY — PX: COLONOSCOPY: SHX5424

## 2014-09-09 HISTORY — PX: ESOPHAGOGASTRODUODENOSCOPY: SHX5428

## 2014-09-09 LAB — GLUCOSE, CAPILLARY: GLUCOSE-CAPILLARY: 105 mg/dL — AB (ref 70–99)

## 2014-09-09 SURGERY — COLONOSCOPY
Anesthesia: Moderate Sedation

## 2014-09-09 MED ORDER — MEPERIDINE HCL 100 MG/ML IJ SOLN
INTRAMUSCULAR | Status: AC
  Filled 2014-09-09: qty 2

## 2014-09-09 MED ORDER — MIDAZOLAM HCL 5 MG/5ML IJ SOLN
INTRAMUSCULAR | Status: DC | PRN
  Administered 2014-09-09: 1 mg via INTRAVENOUS
  Administered 2014-09-09: 2 mg via INTRAVENOUS
  Administered 2014-09-09 (×2): 1 mg via INTRAVENOUS
  Administered 2014-09-09: 2 mg via INTRAVENOUS

## 2014-09-09 MED ORDER — MEPERIDINE HCL 100 MG/ML IJ SOLN
INTRAMUSCULAR | Status: DC | PRN
  Administered 2014-09-09 (×5): 25 mg via INTRAVENOUS

## 2014-09-09 MED ORDER — MIDAZOLAM HCL 5 MG/5ML IJ SOLN
INTRAMUSCULAR | Status: AC
  Filled 2014-09-09: qty 10

## 2014-09-09 MED ORDER — SODIUM CHLORIDE 0.9 % IV SOLN
INTRAVENOUS | Status: DC
  Administered 2014-09-09: 1000 mL via INTRAVENOUS

## 2014-09-09 MED ORDER — LIDOCAINE VISCOUS 2 % MT SOLN
OROMUCOSAL | Status: DC | PRN
  Administered 2014-09-09: 3 mL via OROMUCOSAL

## 2014-09-09 MED ORDER — LIDOCAINE VISCOUS 2 % MT SOLN
OROMUCOSAL | Status: AC
  Filled 2014-09-09: qty 15

## 2014-09-09 NOTE — H&P (Addendum)
  Primary Care Physician:  Geroge Baseman Primary Gastroenterologist:  Dr. Oneida Alar  Pre-Procedure History & Physical: HPI:  Kristina Huang is a 69 y.o. female here for PERSONAL HISTORY OF POLYPS/DYSPEPSIA/LOOSE STOOLS.  Past Medical History  Diagnosis Date  . Hypercholesterolemia   . Acid reflux   . Diabetes mellitus without complication     fasting 100-130  . Arthritis   . 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 Back surgery      spinal fusion  . Cholecystectomy      Dr. Irving Shows  . Tubal ligation    . Bunionectomy Bilateral   . Colonoscopy  2009    Dr. Oneida Alar: moderate internal hemorrhoids, frequent sigmoid colon diverticula, polyps X 3, one large adenoma otherwise hyperplastic    Prior to Admission medications   Medication Sig Start Date End Date Taking? Authorizing Provider  acetaminophen (TYLENOL) 650 MG CR tablet Take 1,300 mg by mouth daily as needed for pain.   Yes Historical Provider, MD  aspirin EC 81 MG tablet Take 81 mg by mouth at bedtime.    Yes Historical Provider, MD  dicyclomine (BENTYL) 10 MG capsule Take 1 capsule (10 mg total) by mouth 4 (four) times daily -  before meals and at bedtime. 08/23/14  Yes Orvil Feil, NP  fluticasone (FLONASE) 50 MCG/ACT nasal spray Place 1 spray into both nostrils daily as needed for allergies.    Yes Historical Provider, MD  lovastatin (MEVACOR) 20 MG tablet Take 20 mg by mouth at bedtime.   Yes Historical Provider, MD  metFORMIN (GLUCOPHAGE) 500 MG tablet Take 500 mg by mouth daily with breakfast.   Yes Historical Provider, MD  omeprazole (PRILOSEC) 20 MG capsule Take 20 mg by mouth daily.   Yes Historical Provider, MD  polyethylene glycol-electrolytes (NULYTELY/GOLYTELY) 420 G solution Take 4,000 mLs by mouth once. 09/01/14  Yes Danie Binder, MD    Allergies as of 08/23/2014  . (No Known Allergies)    Family History  Problem Relation  Age of Onset  . Colon cancer Neg Hx     History   Social History  . Marital Status: Divorced    Spouse Name: N/A    Number of Children: N/A  . Years of Education: N/A   Occupational History  . Not on file.   Social History Main Topics  . Smoking status: Never Smoker   . Smokeless tobacco: Not on file  . Alcohol Use: No  . Drug Use: No  . Sexual Activity: Not on file   Other Topics Concern  . Not on file   Social History Narrative    Review of Systems: See HPI, otherwise negative ROS   Physical Exam: BP 143/88 mmHg  Pulse 106  Temp(Src) 98.3 F (36.8 C) (Oral)  Resp 28  SpO2 97% General:   Alert,  pleasant and cooperative in NAD Head:  Normocephalic and atraumatic. Neck:  Supple; Lungs:  Clear throughout to auscultation.    Heart:  Regular rate and rhythm. Abdomen:  Soft, nontender and nondistended. Normal bowel sounds, without guarding, and without rebound.   Neurologic:  Alert and  oriented x4;  grossly normal neurologically.  Impression/Plan:   PERSONAL HISTORY OF POLYPS/dyspepsia/LOOSE STOOLS.  PLAN:  1. TCS/EGD TODAY

## 2014-09-09 NOTE — Discharge Instructions (Signed)
You had 5 polyps removed. A METAL CLIP WAS PLACED TO PREVENT BLEEDING IN 7-10 DAYS. You have internal hemorrhoids AND DIVERTICULOSIS IN YOUR LEFT COLON. YOUR RIGHT UPPER QUADRANT PAIN IS DUE TO IBS, GASTRITIS, AND NOT HAVING A GALLBLADDER.  You ALSO have a HIATAL HERNIA & POLYPS IN YOUR SMALL BOWEL.  I biopsied your stomach, & SMALL BOWEL.   Take BENTYL 1 OR 2 AT BEDTIME OR BEFORE BREAKFAST TO SLOW DOWN YOUR STOOLS. IT MAY CAUSE DROWSINESS, DRY EYES/MOUTH, BLURRY VISION, OR DIFFICULTY URINATING.  CONTINUE OMEPRAZOLE.  TAKE 30 MINUTES PRIOR TO YOUR FIRST MEAL.  AVOID ITEMS THAT TRIGGER GASTRITIS. SEE INFO BELOW.  FOLLOW A HIGH FIBER/LOW FAT DIET. AVOID ITEMS THAT CAUSE BLOATING. SEE INFO BELOW.  NO MRI FOR 30 DAYS.  Next colonoscopy in 3-5 years.   ENDOSCOPY Care After Read the instructions outlined below and refer to this sheet in the next week. These discharge instructions provide you with general information on caring for yourself after you leave the hospital. While your treatment has been planned according to the most current medical practices available, unavoidable complications occasionally occur. If you have any problems or questions after discharge, call DR. Abygayle Deltoro, [phone].  ACTIVITY  You may resume your regular activity, but move at a slower pace for the next 24 hours.   Take frequent rest periods for the next 24 hours.   Walking will help get rid of the air and reduce the bloated feeling in your belly (abdomen).   No driving for 24 hours (because of the medicine (anesthesia) used during the test).   You may shower.   Do not sign any important legal documents or operate any machinery for 24 hours (because of the anesthesia used during the test).    NUTRITION  Drink plenty of fluids.   You may resume your normal diet as instructed by your doctor.   Begin with a light meal and progress to your normal diet. Heavy or fried foods are harder to digest and may make you  feel sick to your stomach (nauseated).   Avoid alcoholic beverages for 24 hours or as instructed.    MEDICATIONS  You may resume your normal medications.   WHAT YOU CAN EXPECT TODAY  Some feelings of bloating in the abdomen.   Passage of more gas than usual.   Spotting of blood in your stool or on the toilet paper  .  IF YOU HAD POLYPS REMOVED DURING THE ENDOSCOPY:  Eat a soft diet IF YOU HAVE NAUSEA, BLOATING, ABDOMINAL PAIN, OR VOMITING.    FINDING OUT THE RESULTS OF YOUR TEST Not all test results are available during your visit. DR. Oneida Alar WILL CALL YOU WITHIN 14 DAYS OF YOUR PROCEDUE WITH YOUR RESULTS. Do not assume everything is normal if you have not heard from DR. Varian Innes, CALL HER OFFICE AT [phone].  SEEK IMMEDIATE MEDICAL ATTENTION AND CALL THE OFFICE: [phone] IF:  You have more than a spotting of blood in your stool.   Your belly is swollen (abdominal distention).   You are nauseated or vomiting.   You have a temperature over 101F.   You have abdominal pain or discomfort that is severe or gets worse throughout the day.   Gastritis  Gastritis is an inflammation (the body's way of reacting to injury and/or infection) of the stomach. It is often caused by viral or bacterial (germ) infections. It can also be caused BY ASPIRIN, BC/GOODY POWDER'S, (IBUPROFEN) MOTRIN, OR ALEVE (NAPROXEN), chemicals (including alcohol), SPICY  FOODS, and medications. This illness may be associated with generalized malaise (feeling tired, not well), UPPER ABDOMINAL STOMACH cramps, and fever. One common bacterial cause of gastritis is an organism known as H. Pylori. This can be treated with antibiotics.    Hiatal Hernia A hiatal hernia occurs when a part of the stomach slides above the diaphragm. The diaphragm is the thin muscle separating the belly (abdomen) from the chest. A hiatal hernia can be something you are born with or develop over time. Hiatal hernias may allow stomach  acid to flow back into your esophagus, the tube which carries food from your mouth to your stomach. If this acid causes problems it is called GERD (gastro-esophageal reflux disease).   SYMPTOMS Common symptoms of GERD are heartburn (burning in your chest). This is worse when lying down or bending over. It may also cause belching and indigestion. Some of the things which make GERD worse are:  Increased weight pushes on stomach making acid rise more easily.   Smoking markedly increases acid production.   Alcohol decreases lower esophageal sphincter pressure (valve between stomach and esophagus), allowing acid from stomach into esophagus.   Late evening meals and going to bed with a full stomach increases pressure.   Anything that causes an increase in acid production.    HOME CARE INSTRUCTIONS  Try to achieve and maintain an ideal body weight.   Avoid drinking alcoholic beverages.   DO NOT smokE.   Do not wear tight clothing around your chest or stomach.   Eat smaller meals and eat more frequently. This keeps your stomach from getting too full. Eat slowly.   Do not lie down for 2 or 3 hours after eating. Do not eat or drink anything 1 to 2 hours before going to bed.   Avoid caffeine beverages (colas, coffee, cocoa, tea), fatty foods, citrus fruits and all other foods and drinks that contain acid and that seem to increase the problems.   Avoid bending over, especially after eating OR STRAINING. Anything that increases the pressure in your belly increases the amount of acid that may be pushed up into your esophagus.    High-Fiber Diet A high-fiber diet changes your normal diet to include more whole grains, legumes, fruits, and vegetables. Changes in the diet involve replacing refined carbohydrates with unrefined foods. The calorie level of the diet is essentially unchanged. The Dietary Reference Intake (recommended amount) for adult males is 38 grams per day. For adult females, it is  25 grams per day. Pregnant and lactating women should consume 28 grams of fiber per day. Fiber is the intact part of a plant that is not broken down during digestion. Functional fiber is fiber that has been isolated from the plant to provide a beneficial effect in the body. PURPOSE  Increase stool bulk.   Ease and regulate bowel movements.   Lower cholesterol.  INDICATIONS THAT YOU NEED MORE FIBER  Constipation and hemorrhoids.   Uncomplicated diverticulosis (intestine condition) and irritable bowel syndrome.   Weight management.   As a protective measure against hardening of the arteries (atherosclerosis), diabetes, and cancer.   GUIDELINES FOR INCREASING FIBER IN THE DIET  Start adding fiber to the diet slowly. A gradual increase of about 5 more grams (2 slices of whole-wheat bread, 2 servings of most fruits or vegetables, or 1 bowl of high-fiber cereal) per day is best. Too rapid an increase in fiber may result in constipation, flatulence, and bloating.   Drink enough water and  fluids to keep your urine clear or pale yellow. Water, juice, or caffeine-free drinks are recommended. Not drinking enough fluid may cause constipation.   Eat a variety of high-fiber foods rather than one type of fiber.   Try to increase your intake of fiber through using high-fiber foods rather than fiber pills or supplements that contain small amounts of fiber.   The goal is to change the types of food eaten. Do not supplement your present diet with high-fiber foods, but replace foods in your present diet.  INCLUDE A VARIETY OF FIBER SOURCES  Replace refined and processed grains with whole grains, canned fruits with fresh fruits, and incorporate other fiber sources. White rice, white breads, and most bakery goods contain little or no fiber.   Brown whole-grain rice, buckwheat oats, and many fruits and vegetables are all good sources of fiber. These include: broccoli, Brussels sprouts, cabbage,  cauliflower, beets, sweet potatoes, white potatoes (skin on), carrots, tomatoes, eggplant, squash, berries, fresh fruits, and dried fruits.   Cereals appear to be the richest source of fiber. Cereal fiber is found in whole grains and bran. Bran is the fiber-rich outer coat of cereal grain, which is largely removed in refining. In whole-grain cereals, the bran remains. In breakfast cereals, the largest amount of fiber is found in those with "bran" in their names. The fiber content is sometimes indicated on the label.   You may need to include additional fruits and vegetables each day.   In baking, for 1 cup white flour, you may use the following substitutions:   1 cup whole-wheat flour minus 2 tablespoons.   1/2 cup white flour plus 1/2 cup whole-wheat flour.   Low-Fat Diet BREADS, CEREALS, PASTA, RICE, DRIED PEAS, AND BEANS These products are high in carbohydrates and most are low in fat. Therefore, they can be increased in the diet as substitutes for fatty foods. They too, however, contain calories and should not be eaten in excess. Cereals can be eaten for snacks as well as for breakfast.  Include foods that contain fiber (fruits, vegetables, whole grains, and legumes). Research shows that fiber may lower blood cholesterol levels, especially the water-soluble fiber found in fruits, vegetables, oat products, and legumes. FRUITS AND VEGETABLES It is good to eat fruits and vegetables. Besides being sources of fiber, both are rich in vitamins and some minerals. They help you get the daily allowances of these nutrients. Fruits and vegetables can be used for snacks and desserts. MEATS Limit lean meat, chicken, [place], and fish to no more than 6 ounces per day. Beef, Pork, and Lamb Use lean cuts of beef, pork, and lamb. Lean cuts include:  Extra-lean ground beef.  Arm roast.  Sirloin tip.  Center-cut ham.  Round steak.  Loin chops.  Rump roast.  Tenderloin.  Trim all fat off the outside of  meats before cooking. It is not necessary to severely decrease the intake of red meat, but lean choices should be made. Lean meat is rich in protein and contains a highly absorbable form of iron. Premenopausal women, in particular, should avoid reducing lean red meat because this could increase the risk for low red blood cells (iron-deficiency anemia). The organ meats, such as liver, sweetbreads, kidneys, and brain are very rich in cholesterol. They should be limited. Chicken and [place] These are good sources of protein. The fat of poultry can be reduced by removing the skin and underlying fat layers before cooking. Chicken and [place] can be substituted for lean red  meat in the diet. Poultry should not be fried or covered with high-fat sauces. Fish and Shellfish Fish is a good source of protein. Shellfish contain cholesterol, but they usually are low in saturated fatty acids. The preparation of fish is important. Like chicken and [place], they should not be fried or covered with high-fat sauces. EGGS Egg whites contain no fat or cholesterol. They can be eaten often. Try 1 to 2 egg whites instead of whole eggs in recipes or use egg substitutes that do not contain yolk. MILK AND DAIRY PRODUCTS Use skim or 1% milk instead of 2% or whole milk. Decrease whole milk, natural, and processed cheeses. Use nonfat or low-fat (2%) cottage cheese or low-fat cheeses made from vegetable oils. Choose nonfat or low-fat (1 to 2%) yogurt. Experiment with evaporated skim milk in recipes that call for heavy cream. Substitute low-fat yogurt or low-fat cottage cheese for sour cream in dips and salad dressings. Have at least 2 servings of low-fat dairy products, such as 2 glasses of skim (or 1%) milk each day to help get your daily calcium intake.  FATS AND OILS Reduce the total intake of fats, especially saturated fat. Butterfat, lard, and beef fats are high in saturated fat and cholesterol. These should be avoided as much as  possible. Vegetable fats do not contain cholesterol, but certain vegetable fats, such as coconut oil, palm oil, and palm kernel oil are very high in saturated fats. These should be limited. These fats are often used in bakery goods, processed foods, popcorn, oils, and nondairy creamers. Vegetable shortenings and some peanut butters contain hydrogenated oils, which are also saturated fats. Read the labels on these foods and check for saturated vegetable oils. Unsaturated vegetable oils and fats do not raise blood cholesterol. However, they should be limited because they are fats and are high in calories. Total fat should still be limited to 30% of your daily caloric intake. Desirable liquid vegetable oils are corn oil, cottonseed oil, olive oil, canola oil, safflower oil, soybean oil, and sunflower oil. Peanut oil is not as good, but small amounts are acceptable. Buy a heart-healthy tub margarine that has no partially hydrogenated oils in the ingredients. Mayonnaise and salad dressings often are made from unsaturated fats, but they should also be limited because of their high calorie and fat content. Seeds, nuts, peanut butter, olives, and avocados are high in fat, but the fat is mainly the unsaturated type. These foods should be limited mainly to avoid excess calories and fat. OTHER EATING TIPS Snacks  Most sweets should be limited as snacks. They tend to be rich in calories and fats, and their caloric content outweighs their nutritional value. Some good choices in snacks are graham crackers, melba toast, soda crackers, bagels (no egg), English muffins, fruits, and vegetables. These snacks are preferable to snack crackers, [place] fries, and chips. Popcorn should be air-popped or cooked in small amounts of liquid vegetable oil. Desserts Eat fruit, low-fat yogurt, and fruit ices. AVOID pastries, cake, and cookies. Sherbet, angel food cake, gelatin dessert, frozen low-fat yogurt, or other frozen products that do  not contain saturated fat (pure fruit juice bars, frozen ice pops) are also acceptable.  COOKING METHODS Choose those methods that use little or no fat. They include: Poaching.  Braising.  Steaming.  Grilling.  Baking.  Stir-frying.  Broiling.  Microwaving.  Foods can be cooked in a nonstick pan without added fat, or use a nonfat cooking spray in regular cookware. Limit fried foods and  avoid frying in saturated fat. Add moisture to lean meats by using water, broth, cooking wines, and other nonfat or low-fat sauces along with the cooking methods mentioned above. Soups and stews should be chilled after cooking. The fat that forms on top after a few hours in the refrigerator should be skimmed off. When preparing meals, avoid using excess salt. Salt can contribute to raising blood pressure in some people. EATING AWAY FROM HOME Order entres, potatoes, and vegetables without sauces or butter. When meat exceeds the size of a deck of cards (3 to 4 ounces), the rest can be taken home for another meal. Choose vegetable or fruit salads and ask for low-calorie salad dressings to be served on the side. Use dressings sparingly. Limit high-fat toppings, such as bacon, crumbled eggs, cheese, sunflower seeds, and olives. Ask for heart-healthy tub margarine instead of butter.  Hemorrhoids Hemorrhoids are dilated (enlarged) veins around the rectum. Sometimes clots will form in the veins. This makes them swollen and painful. These are called thrombosed hemorrhoids. Causes of hemorrhoids include:  Constipation.   Straining to have a bowel movement.   HEAVY LIFTING HOME CARE INSTRUCTIONS  Eat a well balanced diet and drink 6 to 8 glasses of water every day to avoid constipation. You may also use a bulk laxative.   Avoid straining to have bowel movements.   Keep anal area dry and clean.   Do not use a donut shaped pillow or sit on the toilet for long periods. This increases blood pooling and pain.    Move your bowels when your body has the urge; this will require less straining and will decrease pain and pressure.

## 2014-09-09 NOTE — Progress Notes (Signed)
REVIEWED. AGREE. NO ADDITIONAL RECOMMENDATIONS. 

## 2014-09-09 NOTE — Op Note (Signed)
[place], [id number]   COLONOSCOPY PROCEDURE REPORT  PATIENT: Kristina Huang, Kristina Huang  MR#: [id number] BIRTHDATE: 1946-03-20 , 68  yrs. old GENDER: female ENDOSCOPIST: Danie Binder, MD REFERRED MM:NOTR Claggett, PA-C PROCEDURE DATE:  September 14, 2014 PROCEDURE:   Colonoscopy with cold biopsy polypectomy and Colonoscopy with snare polypectomy INDICATIONS:high risk personal history of colonic polyps and AND LOOSE STOOLS.Marland Kitchen MEDICATIONS: Demerol 75 mg IV and Versed 5 mg IV  DESCRIPTION OF PROCEDURE:    Physical exam was performed.  Informed consent was obtained from the patient after explaining the benefits, risks, and alternatives to procedure.  The patient was connected to monitor and placed in left lateral position. Continuous oxygen was provided by nasal cannula and IV medicine administered through an indwelling cannula.  After administration of sedation and rectal exam, the patients rectum was intubated and the EC-[id number] ([id number])  colonoscope was advanced under direct visualization to the cecum.  The scope was removed slowly by carefully examining the color, texture, anatomy, and integrity mucosa on the way out.  The patient was recovered in endoscopy and discharged home in satisfactory condition.    COLON FINDINGS: Two sessile polyps ranging from 6 to 70mm in size were found in the sigmoid colon(1) and at the cecum.  A polypectomy was performed using snare cautery.  , Six sessile polyps ranging from 2 to 62mm in size were found in the transverse colon(3) and sigmoid colon(3).  A polypectomy was performed with cold forceps. , There was mild diverticulosis noted in the sigmoid colon with associated angulation and muscular hypertrophy.  , and Moderate sized internal hemorrhoids were found.  PREP QUALITY: good.CECAL W/D TIME: 23       minutes   COMPLICATIONS: None  ENDOSCOPIC IMPRESSION: 1.   8 COLON POLYPS REMOVED. REDUNDANT LEFT COLON. 2.    Mild diverticulosis noted in the sigmoid colon 3.   Moderate sized internal hemorrhoids  RECOMMENDATIONS: Take BENTYL 1 OR 2 AT BEDTIME OR BEFORE BREAKFAST TO SLOW DOWN YOUR STOOLS. CONTINUE OMEPRAZOLE.  TAKE 30 MINUTES PRIOR TO YOUR FIRST MEAL. AVOID ITEMS THAT TRIGGER GASTRITIS. FOLLOW A HIGH FIBER/LOW FAT DIET.  AVOID ITEMS THAT CAUSE BLOATING.  AWAIT BIOPSY. NO MRI FOR 30 DAYS. Next colonoscopy in 3-5 years WITH AN OVERTUBE.  ______________________________ eSignedDanie Binder, MD 09-14-14 3:44 PM    CPT CODES: ICD CODES:  The ICD and CPT codes recommended by this software are interpretations from the data that the clinical staff has captured with the software.  The verification of the translation of this report to the ICD and CPT codes and modifiers is the sole responsibility of the health care institution and practicing physician where this report was generated.  [employer]. will not be held responsible for the validity of the ICD and CPT codes included on this report.  AMA assumes no liability for data contained or not contained herein. CPT is a Designer, television/film set of the [employer].

## 2014-09-09 NOTE — Op Note (Addendum)
[place], [address]   ENDOSCOPY PROCEDURE REPORT  PATIENT: Kristina, Huang  MR#: [id number] BIRTHDATE: 02-22-1946 , 68  yrs. old GENDER: female  ENDOSCOPIST: Danie Binder, MD REFERRED JQ:ZESP Claggett, PA-C PROCEDURE DATE: Sep 10, 2014 PROCEDURE:   EGD w/ biopsy  INDICATIONS:dyspepsia.   loose stools since 2009.  WEIGHT UNCHANGED: 170-176 LBS. MEDICATIONS: TCS+ Demerol 25 mg IV and Versed 2 mg IV TOPICAL ANESTHETIC:   Viscous Xylocaine ASA CLASS:  DESCRIPTION OF PROCEDURE:     Physical exam was performed.  Informed consent was obtained from the patient after explaining the benefits, risks, and alternatives to the procedure.  The patient was connected to the monitor and placed in the left lateral position.  Continuous oxygen was provided by nasal cannula and IV medicine administered through an indwelling cannula.  After administration of sedation, the patients esophagus was intubated and the [id number] ([id number])  endoscope was advanced under direct visualization to the second portion of the duodenum.  The scope was removed slowly by carefully examining the color, texture, anatomy, and integrity of the mucosa on the way out.  The patient was recovered in endoscopy and discharged home in satisfactory condition.   ESOPHAGUS: The mucosa of the esophagus appeared normal.   STOMACH: Small hiatal hernia. Moderate non-erosive gastritis (inflammation) was found in the gastric antrum.  Multiple biopsies were performed using cold forceps.   COLD FORCEPS BIOPSIES OBTAINED IN BULB/SECOND PORTION OF THE DUODENUM TO EVALUATE FOR CELIAC SPRUE.   DUODENUM: Multiple sessile polyps ranging between 3-69mm in size were found in the bulb and 2nd part of the duodenum.  BIOPSY was performed with a cold forceps.        COMPLICATIONS: There were no immediate complications.  ENDOSCOPIC IMPRESSION: 1.   RIGHT UPPER QUADRANT PAIN IS DUE TO IBS, GASTRITIS, AND  NOT HAVING A GALLBLADDER. 2.   MILD Non-erosive gastritis (inflammation) was found in the gastric antrum; multiple biopsies were performed 3.  Multiple duodenal polyps  RECOMMENDATIONS: Take BENTYL 1 OR 2 AT BEDTIME OR BEFORE BREAKFAST TO SLOW DOWN YOUR STOOLS. CONTINUE OMEPRAZOLE.  TAKE 30 MINUTES PRIOR TO YOUR FIRST MEAL. AVOID ITEMS THAT TRIGGER GASTRITIS. FOLLOW A HIGH FIBER/LOW FAT DIET.  AVOID ITEMS THAT CAUSE BLOATING.  NO MRI FOR 30 DAYS. Next colonoscopy in 3-5 years.  REPEAT EXAM: eSigned:  Danie Binder, MD Sep 10, 2014 3:48 PM Revised: September 10, 2014 3:48 PM   CPT CODES: ICD CODES:  The ICD and CPT codes recommended by this software are interpretations from the data that the clinical staff has captured with the software.  The verification of the translation of this report to the ICD and CPT codes and modifiers is the sole responsibility of the health care institution and practicing physician where this report was generated.  [place]. will not be held responsible for the validity of the ICD and CPT codes included on this report.  AMA assumes no liability for data contained or not contained herein. CPT is a Designer, television/film set of the [employer].

## 2014-09-12 ENCOUNTER — Encounter (HOSPITAL_COMMUNITY): Payer: Self-pay | Admitting: Gastroenterology

## 2014-09-21 ENCOUNTER — Telehealth: Payer: Self-pay | Admitting: Gastroenterology

## 2014-09-21 NOTE — Telephone Encounter (Signed)
Reminder in epic °

## 2014-09-21 NOTE — Telephone Encounter (Signed)
Please call pt. She had EIGHT simple adenomas removed. Please call pt. HER stomach Bx shows  gastritis.  HER SMALL BOWEL BIOPSIES ARE SHOW DUODENITIS.    Take BENTYL 1 OR 2 AT BEDTIME OR BEFORE BREAKFAST TO SLOW DOWN YOUR STOOLS. IT MAY CAUSE DROWSINESS, DRY EYES/MOUTH, BLURRY VISION, OR DIFFICULTY URINATING.   SHE COULD ALSO TRY ONE TUMS WITH MEALS UP TO THREE TIMES A DAY TO PREVENT DIARRHEA AFTER EATING.  CONTINUE OMEPRAZOLE.  TAKE 30 MINUTES PRIOR TO YOUR FIRST MEAL.  AVOID ITEMS THAT TRIGGER GASTRITIS.   FOLLOW A HIGH FIBER/LOW FAT DIET. AVOID ITEMS THAT CAUSE BLOATING.   NO MRI UNTIL AFTER MAR 1  Next colonoscopy in 3 years. YOUR SISTERS, BROTHERS, CHILDREN, AND PARENTS NEED TO HAVE A COLONOSCOPY STARTING AT THE AGE OF 40.

## 2014-09-22 NOTE — Telephone Encounter (Signed)
Pt is aware of results. 

## 2015-03-31 ENCOUNTER — Emergency Department (HOSPITAL_COMMUNITY): Payer: Medicare PPO

## 2015-03-31 ENCOUNTER — Emergency Department (HOSPITAL_COMMUNITY)
Admission: EM | Admit: 2015-03-31 | Discharge: 2015-03-31 | Disposition: A | Discharge status: Home / Self Care | Payer: Medicare PPO | Attending: Emergency Medicine | Admitting: Emergency Medicine

## 2015-03-31 ENCOUNTER — Encounter (HOSPITAL_COMMUNITY): Payer: Self-pay | Admitting: Emergency Medicine

## 2015-03-31 DIAGNOSIS — Z7982 Long term (current) use of aspirin: Secondary | ICD-10-CM | POA: Diagnosis not present

## 2015-03-31 DIAGNOSIS — M25562 Pain in left knee: Secondary | ICD-10-CM | POA: Insufficient documentation

## 2015-03-31 DIAGNOSIS — Z79899 Other long term (current) drug therapy: Secondary | ICD-10-CM | POA: Insufficient documentation

## 2015-03-31 DIAGNOSIS — M2550 Pain in unspecified joint: Secondary | ICD-10-CM

## 2015-03-31 DIAGNOSIS — E119 Type 2 diabetes mellitus without complications: Secondary | ICD-10-CM | POA: Diagnosis not present

## 2015-03-31 DIAGNOSIS — K219 Gastro-esophageal reflux disease without esophagitis: Secondary | ICD-10-CM | POA: Insufficient documentation

## 2015-03-31 DIAGNOSIS — M1990 Unspecified osteoarthritis, unspecified site: Secondary | ICD-10-CM | POA: Insufficient documentation

## 2015-03-31 DIAGNOSIS — M25551 Pain in right hip: Secondary | ICD-10-CM | POA: Diagnosis not present

## 2015-03-31 DIAGNOSIS — M25561 Pain in right knee: Secondary | ICD-10-CM | POA: Diagnosis not present

## 2015-03-31 DIAGNOSIS — E780 Pure hypercholesterolemia: Secondary | ICD-10-CM | POA: Diagnosis not present

## 2015-03-31 DIAGNOSIS — M791 Myalgia: Secondary | ICD-10-CM | POA: Diagnosis present

## 2015-03-31 MED ORDER — HYDROCODONE-ACETAMINOPHEN 5-325 MG PO TABS
1.0000 | ORAL_TABLET | Freq: Once | ORAL | Status: AC
  Administered 2015-03-31: 1 via ORAL
  Filled 2015-03-31: qty 1

## 2015-03-31 MED ORDER — DICLOFENAC SODIUM 75 MG PO TBEC: 75.0000 mg | DELAYED_RELEASE_TABLET | Freq: Two times a day (BID) | ORAL | Status: DC

## 2015-03-31 MED ORDER — IBUPROFEN 800 MG PO TABS
800.0000 mg | ORAL_TABLET | Freq: Once | ORAL | Status: AC
  Administered 2015-03-31: 800 mg via ORAL
  Filled 2015-03-31: qty 1

## 2015-03-31 MED ORDER — HYDROCODONE-ACETAMINOPHEN 5-325 MG PO TABS: ORAL_TABLET | ORAL | Status: DC

## 2015-03-31 NOTE — ED Notes (Addendum)
Pt reports generalized body aches x1 month. Pt denies any known injury. Pt denies any gi/gu symptoms.

## 2015-03-31 NOTE — Discharge Instructions (Signed)
Arthritis, Nonspecific °Arthritis is inflammation of a joint. This usually means pain, redness, warmth or swelling are present. One or more joints may be involved. There are a number of types of arthritis. Your caregiver may not be able to tell what type of arthritis you have right away. °CAUSES  °The most common cause of arthritis is the wear and tear on the joint (osteoarthritis). This causes damage to the cartilage, which can break down over time. The knees, hips, back and neck are most often affected by this type of arthritis. °Other types of arthritis and common causes of joint pain include: °· Sprains and other injuries near the joint. Sometimes minor sprains and injuries cause pain and swelling that develop hours later. °· Rheumatoid arthritis. This affects hands, feet and knees. It usually affects both sides of your body at the same time. It is often associated with chronic ailments, fever, weight loss and general weakness. °· Crystal arthritis. Gout and pseudo gout can cause occasional acute severe pain, redness and swelling in the foot, ankle, or knee. °· Infectious arthritis. Bacteria can get into a joint through a break in overlying skin. This can cause infection of the joint. Bacteria and viruses can also spread through the blood and affect your joints. °· Drug, infectious and allergy reactions. Sometimes joints can become mildly painful and slightly swollen with these types of illnesses. °SYMPTOMS  °· Pain is the main symptom. °· Your joint or joints can also be red, swollen and warm or hot to the touch. °· You may have a fever with certain types of arthritis, or even feel overall ill. °· The joint with arthritis will hurt with movement. Stiffness is present with some types of arthritis. °DIAGNOSIS  °Your caregiver will suspect arthritis based on your description of your symptoms and on your exam. Testing may be needed to find the type of arthritis: °· Blood and sometimes urine tests. °· X-ray tests  and sometimes CT or MRI scans. °· Removal of fluid from the joint (arthrocentesis) is done to check for bacteria, crystals or other causes. Your caregiver (or a specialist) will numb the area over the joint with a local anesthetic, and use a needle to remove joint fluid for examination. This procedure is only minimally uncomfortable. °· Even with these tests, your caregiver may not be able to tell what kind of arthritis you have. Consultation with a specialist (rheumatologist) may be helpful. °TREATMENT  °Your caregiver will discuss with you treatment specific to your type of arthritis. If the specific type cannot be determined, then the following general recommendations may apply. °Treatment of severe joint pain includes: °· Rest. °· Elevation. °· Anti-inflammatory medication (for example, ibuprofen) may be prescribed. Avoiding activities that cause increased pain. °· Only take over-the-counter or prescription medicines for pain and discomfort as recommended by your caregiver. °· Cold packs over an inflamed joint may be used for 10 to 15 minutes every hour. Hot packs sometimes feel better, but do not use overnight. Do not use hot packs if you are diabetic without your caregiver's permission. °· A cortisone shot into arthritic joints may help reduce pain and swelling. °· Any acute arthritis that gets worse over the next 1 to 2 days needs to be looked at to be sure there is no joint infection. °Long-term arthritis treatment involves modifying activities and lifestyle to reduce joint stress jarring. This can include weight loss. Also, exercise is needed to nourish the joint cartilage and remove waste. This helps keep the muscles   around the joint strong. °HOME CARE INSTRUCTIONS  °· Do not take aspirin to relieve pain if gout is suspected. This elevates uric acid levels. °· Only take over-the-counter or prescription medicines for pain, discomfort or fever as directed by your caregiver. °· Rest the joint as much as  possible. °· If your joint is swollen, keep it elevated. °· Use crutches if the painful joint is in your leg. °· Drinking plenty of fluids may help for certain types of arthritis. °· Follow your caregiver's dietary instructions. °· Try low-impact exercise such as: °¨ Swimming. °¨ Water aerobics. °¨ Biking. °¨ Walking. °· Morning stiffness is often relieved by a warm shower. °· Put your joints through regular range-of-motion. °SEEK MEDICAL CARE IF:  °· You do not feel better in 24 hours or are getting worse. °· You have side effects to medications, or are not getting better with treatment. °SEEK IMMEDIATE MEDICAL CARE IF:  °· You have a fever. °· You develop severe joint pain, swelling or redness. °· Many joints are involved and become painful and swollen. °· There is severe back pain and/or leg weakness. °· You have loss of bowel or bladder control. °Document Released: 09/05/2004 Document Revised: 10/21/2011 Document Reviewed: 09/21/2008 °[employer]® Patient Information ©2015 [employer]. This information is not intended to replace advice given to you by your health care provider. Make sure you discuss any questions you have with your health care provider. ° °

## 2015-04-02 NOTE — ED Provider Notes (Signed)
CSN: [id number]     Arrival date & time 03/31/15  1144 History   First MD Initiated Contact with Patient 03/31/15 1215     Chief Complaint  Patient presents with  . Generalized Body Aches     (Consider location/radiation/quality/duration/timing/severity/associated sxs/prior Treatment) HPI  Kristina Huang is a 69 y.o. female who presents to the Emergency Department complaining of generalized joint pains for one month.  She complains of bilateral knee pain and right hip pain that is worse with weight bearing. She reports taking tylenol and ASA with minimal relief.  She denies injury, fever, chills, rash, abd pain, urine or bowel changes, joint swelling or redness.  She states the pain is similar to previous episodes.     Past Medical History  Diagnosis Date  . Hypercholesterolemia   . Acid reflux   . Diabetes mellitus without complication     fasting 100-130  . Arthritis   . 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 Back surgery      spinal fusion  . Cholecystectomy      Dr. Irving Shows  . Tubal ligation    . Bunionectomy Bilateral   . Colonoscopy  2009    Dr. Oneida Alar: moderate internal hemorrhoids, frequent sigmoid colon diverticula, polyps X 3, one large adenoma otherwise hyperplastic  . Colonoscopy N/A 09/09/2014    Procedure: COLONOSCOPY;  Surgeon: Danie Binder, MD;  Location: [facility];  Service: Endoscopy;  Laterality: N/A;  200 - moved to 1/29 @ 12:45  . Esophagogastroduodenoscopy N/A 09/09/2014    Procedure: ESOPHAGOGASTRODUODENOSCOPY (EGD);  Surgeon: Danie Binder, MD;  Location: [facility];  Service: Endoscopy;  Laterality: N/A;   Family History  Problem Relation Age of Onset  . Colon cancer Neg Hx    Social History  Substance Use Topics  . Smoking status: Never Smoker   . Smokeless tobacco: None  . Alcohol Use: No   OB History    No data available     Review of Systems   Constitutional: Negative for fever and chills.  Gastrointestinal: Negative for nausea, vomiting and abdominal pain.  Genitourinary: Negative for dysuria, urgency, decreased urine volume and difficulty urinating.  Musculoskeletal: Positive for arthralgias. Negative for back pain, joint swelling, neck pain and neck stiffness.  Skin: Negative for color change, rash and wound.  Neurological: Negative for dizziness, weakness and numbness.  All other systems reviewed and are negative.     Allergies  Review of patient's allergies indicates no known allergies.  Home Medications   Prior to Admission medications   Medication Sig Start Date End Date Taking? Authorizing Provider  acetaminophen (TYLENOL) 650 MG CR tablet Take 1,300 mg by mouth daily as needed for pain.   Yes Historical Provider, MD  aspirin EC 81 MG tablet Take 81 mg by mouth at bedtime.    Yes Historical Provider, MD  lovastatin (MEVACOR) 20 MG tablet Take 20 mg by mouth at bedtime.   Yes Historical Provider, MD  metFORMIN (GLUCOPHAGE) 500 MG tablet Take 500 mg by mouth daily with breakfast.   Yes Historical Provider, MD  omeprazole (PRILOSEC) 20 MG capsule Take 20 mg by mouth daily.   Yes Historical Provider, MD  diclofenac (VOLTAREN) 75 MG EC tablet Take 1 tablet (75 mg total) by mouth 2 (two) times daily. Take with food 03/31/15   Ashur Glatfelter, PA-C  dicyclomine (BENTYL) 10 MG capsule  Take 1 capsule (10 mg total) by mouth 4 (four) times daily -  before meals and at bedtime. Patient not taking: Reported on 03/31/2015 08/23/14   Orvil Feil, NP  fluticasone [facility]) 50 MCG/ACT nasal spray Place 1 spray into both nostrils daily as needed for allergies.     Historical Provider, MD  HYDROcodone-acetaminophen (NORCO/VICODIN) 5-325 MG per tablet Take one tab po q 4-6 hrs prn pain 03/31/15   Canaan Holzer, PA-C   BP 158/86 mmHg  Pulse 98  Temp(Src) 98.6 F (37 C) (Oral)  Resp 18  Ht 5\' 3"  (1.6 m)  Wt 166 lb (75.297 kg)  BMI  29.41 kg/m2  SpO2 98% Physical Exam  Constitutional: She is oriented to person, place, and time. She appears well-developed and well-nourished. No distress.  HENT:  Head: Normocephalic and atraumatic.  Cardiovascular: Normal rate, regular rhythm, normal heart sounds and intact distal pulses.   Pulmonary/Chest: Effort normal and breath sounds normal. No respiratory distress.  Abdominal: Soft. She exhibits no distension. There is no tenderness. There is no rebound.  Musculoskeletal: She exhibits tenderness. She exhibits no edema.  ttp of the posterior right hip.  Pain reproduced with external rotation and SLR on right.  No erythema, effusion, or step-off deformity. No spinal tenderness.  DP pulse brisk, distal sensation intact. Calf is soft and NT, no edema. Tenderness on ROM of bilateral knees.  No erythema or edema.    Neurological: She is alert and oriented to person, place, and time. She exhibits normal muscle tone. Coordination normal.  Skin: Skin is warm and dry. No erythema.  Nursing note and vitals reviewed.   ED Course  Procedures (including critical care time) Labs Review Labs Reviewed - No data to display  Imaging Review Dg Hip Unilat With Pelvis 2-3 Views Right  03/31/2015   CLINICAL DATA:  Right hip pain after walking on a treadmill yesterday.  EXAM: DG HIP (WITH OR WITHOUT PELVIS) 2-3V RIGHT  COMPARISON:  None.  FINDINGS: Both hips are normally located. There are moderate bilateral hip joint degenerative changes, left greater than right with joint space narrowing and osteophytic spurring. No acute hip fracture or plain film evidence of avascular necrosis. The pubic symphysis and SI joints are intact. No definite pelvic fractures. Lumbar fusion hardware is noted.  IMPRESSION: Bilateral hip joint degenerative changes, left slightly greater than right. No acute bony findings.   Electronically Signed   By: Marijo Sanes M.D.   On: 03/31/2015 13:18     I have personally reviewed and  evaluated these images and lab results as part of my medical decision-making.   EKG Interpretation None      MDM   Final diagnoses:  Hip pain, right  Arthralgia   Pt with deg changes on imaging.  Ambulates with a steady gait.  No focal neuro deficits.  No concerning sx's for septic joint.  Pt agrees to symptomatic tx and close PMD or ortho f/u.  NV intact.       Bufford Lope 04/02/15 2047  Ezequiel Essex, MD 04/03/15 [phone]

## 2015-06-27 ENCOUNTER — Other Ambulatory Visit (HOSPITAL_COMMUNITY): Payer: Self-pay | Admitting: Physician Assistant

## 2015-06-27 ENCOUNTER — Other Ambulatory Visit (HOSPITAL_COMMUNITY): Payer: Self-pay | Admitting: Orthopedic Surgery

## 2015-06-27 DIAGNOSIS — Z1231 Encounter for screening mammogram for malignant neoplasm of breast: Secondary | ICD-10-CM

## 2015-07-05 ENCOUNTER — Ambulatory Visit (HOSPITAL_COMMUNITY)
Admission: RE | Admit: 2015-07-05 | Discharge: 2015-07-05 | Disposition: A | Discharge status: Home / Self Care | Payer: Medicare PPO | Source: Ambulatory Visit | Attending: Orthopedic Surgery | Admitting: Orthopedic Surgery

## 2015-07-05 DIAGNOSIS — Z1231 Encounter for screening mammogram for malignant neoplasm of breast: Secondary | ICD-10-CM | POA: Diagnosis present

## 2015-10-24 IMAGING — CT CT L SPINE W/O CM
4 of 11 series · 13 of 33 positions shown, 15 images · non-contrast
Comparison: Office radiographs 09/23/2013.

CLINICAL DATA: Chronic low back pain with bilateral leg pain and
difficulty walking. History of lumbar fusion in 9990.

EXAM:
CT LUMBAR SPINE WITHOUT CONTRAST
TECHNIQUE: Multidetector CT imaging of the lumbar spine was performed without
intravenous contrast administration. Multiplanar CT image
reconstructions were also generated.

[Series 2: l spine bone · axial · 0.27mm/px · z∈[-242,+8]mm · 3 of 101 slices shown, 4 images]
[im 1/101  soft-tissue]
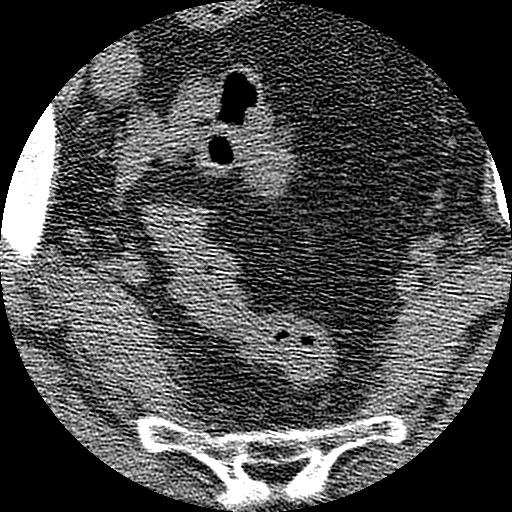
[im 1/101  bone]
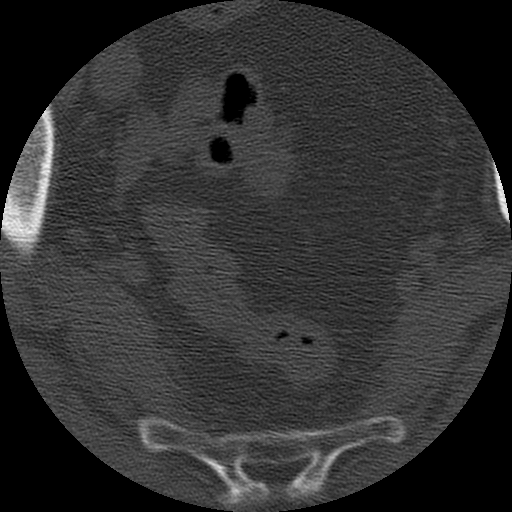
[im 51/101  bone]
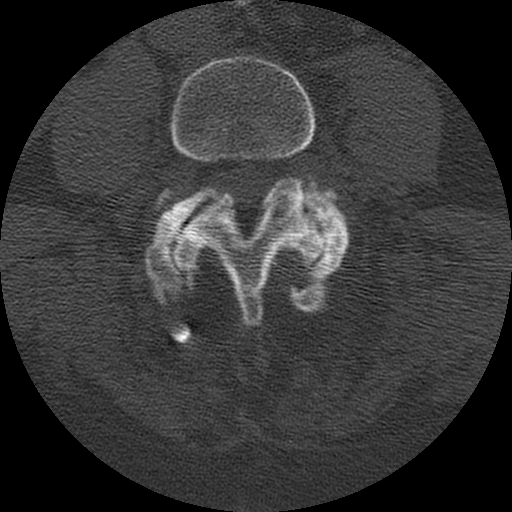
[im 101/101  bone]
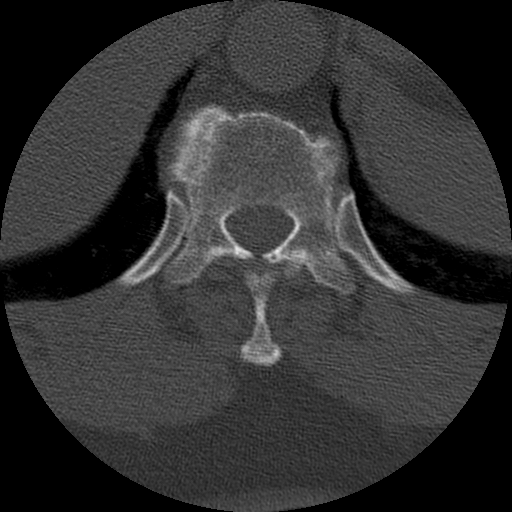

[Series 3: l spine soft · axial · 0.27mm/px · z∈[-159,-77]mm · 2 of 101 slices shown]
[im 34/101  soft-tissue]
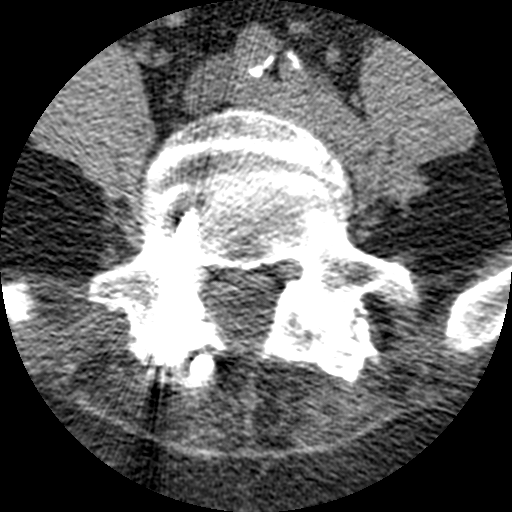
[im 67/101  soft-tissue]
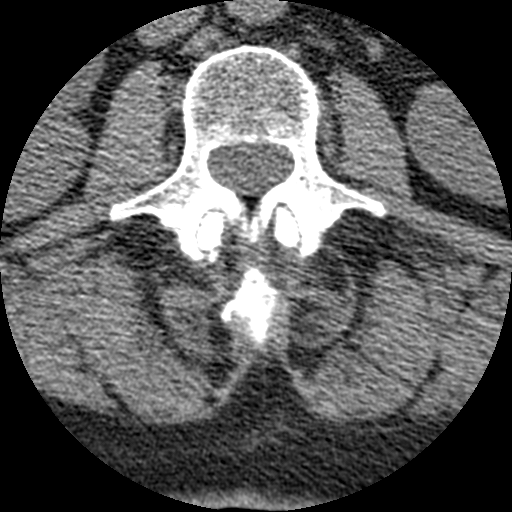

[Series 401: cor lower · coronal · 0.50mm/px · 3 of 58 slices shown]
[im 8/58  bone]
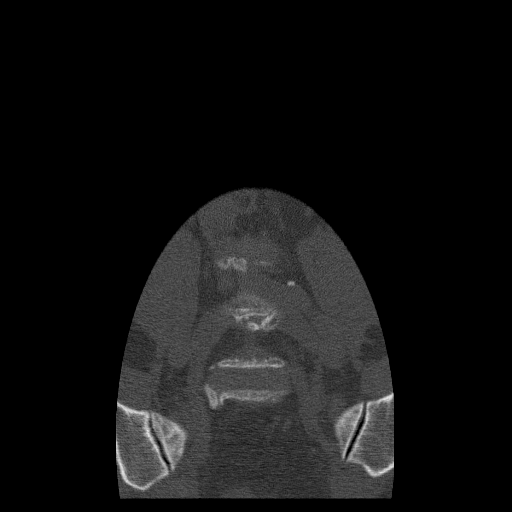
[im 29/58  bone]
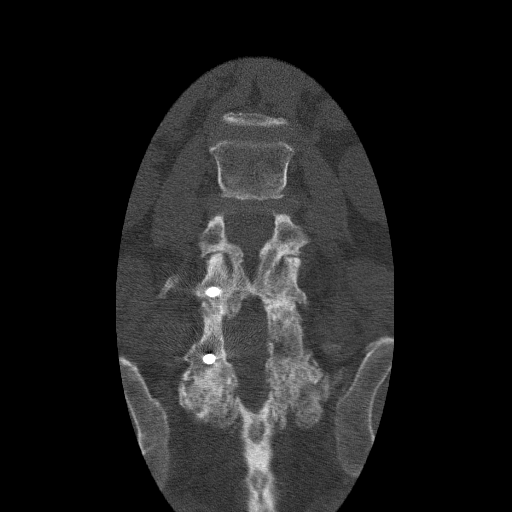
[im 51/58  bone]
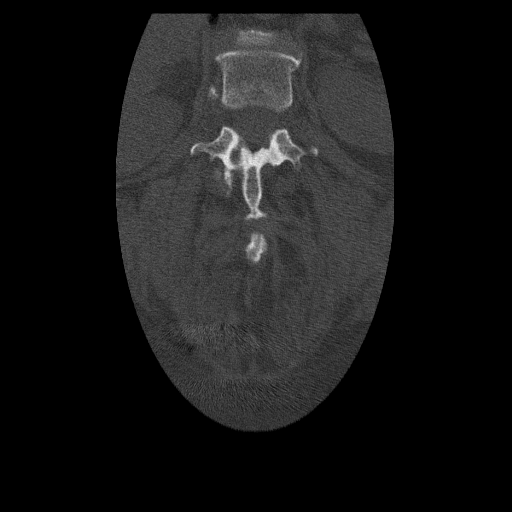

[Series 402: sag · sagittal · 0.50mm/px · 5 of 59 slices shown, 6 images]
[im 20/59  bone]
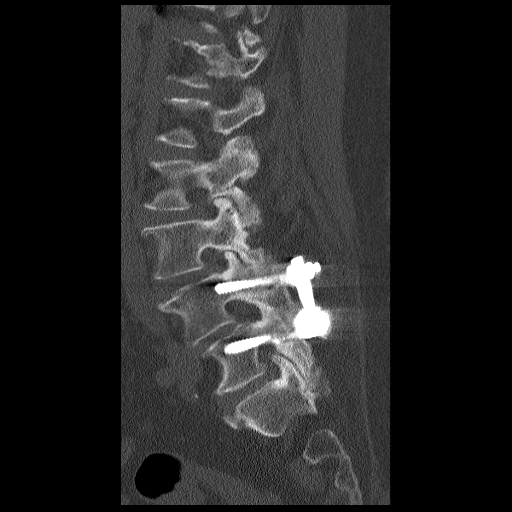
[im 25/59  bone]
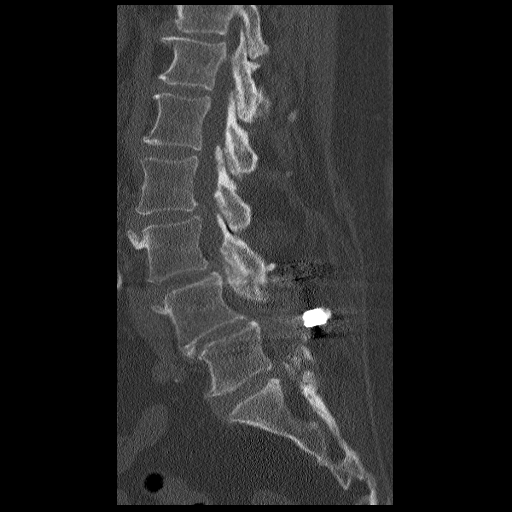
[im 30/59  soft-tissue]
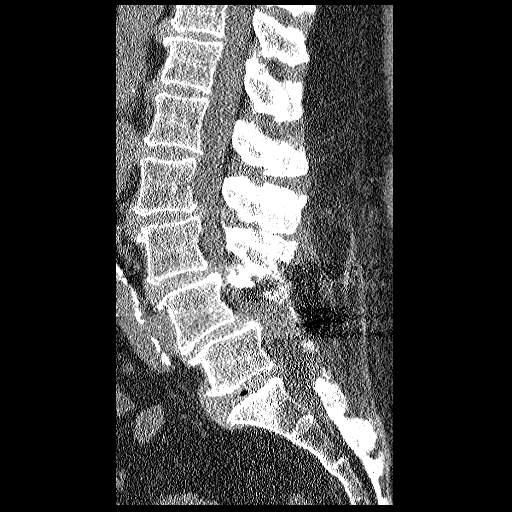
[im 30/59  bone]
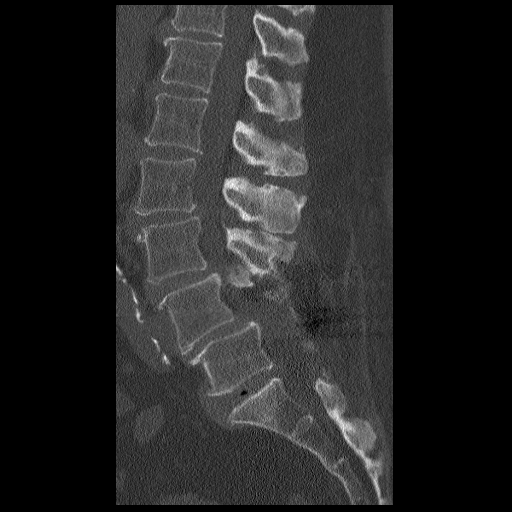
[im 34/59  bone]
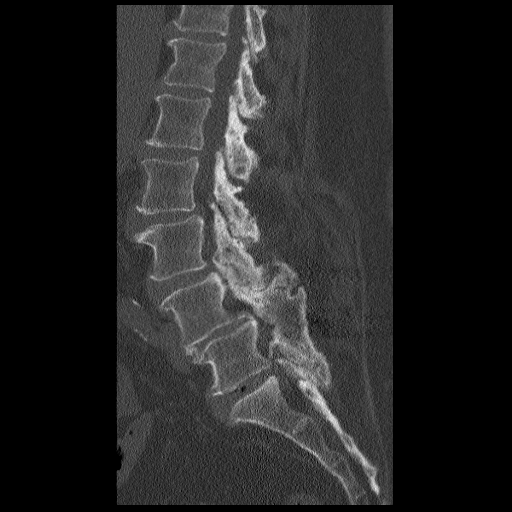
[im 39/59  bone]
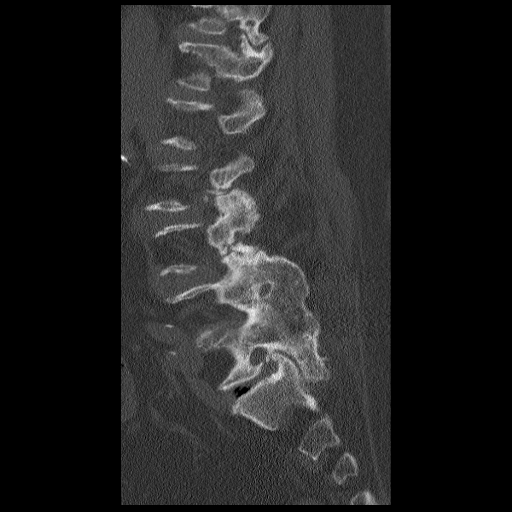

[13 of 33 positions shown; findings below may reference images not displayed]

FINDINGS: Patient is status post bilateral laminectomy at L4-5 and L5-S1.
Unilateral pedicle screw fusion has been performed on the right at
L4-5. The right L5 pedicle screw is fractured and mildly displaced.
However, there is no hardware loosening, and the posterolateral
fusion appears solid bilaterally. There is no evidence of acute
fracture or pars defect. Sacroiliac degenerative changes are present
bilaterally.

There are no paraspinal abnormalities. Aortoiliac atherosclerosis
and mild atrophy of the erector spinae musculature are noted.

There is mild disc bulging with anterior osteophyte formation at
T11-12 and T12-L1. No significant spinal stenosis results.

L1-2: Mild disc bulging and bilateral facet hypertrophy. No
significant spinal stenosis or nerve root encroachment.

L2-3: There is annular disc bulging with moderate facet and
ligamentous hypertrophy. These factors contribute to moderate to
severe spinal stenosis with narrowing of both lateral recesses. The
foramina are mildly narrowed inferiorly without definite L2 nerve
root encroachment.

L3-4: There is annular disc bulging with advanced bilateral facet
hypertrophy accounting for the grade 1 anterolisthesis. These
factors contribute to severe spinal stenosis with narrowing of both
lateral recesses. In addition, there is mild right and moderate left
foraminal stenosis.

L4-5: The spinal canal is adequately decompressed by the prior
laminectomies. The facet joints are fused. Asymmetric osteophytes
within the left foramen contribute to mild left foraminal stenosis.
There is no definite nerve root encroachment.

L5-S1: There is advanced bilateral facet hypertrophy with annular
disc bulging and paraspinal osteophytes. The spinal canal is
adequately decompressed by the prior laminectomies. However, there
is moderate foraminal stenosis bilaterally.
IMPRESSION: 1. Prior laminectomies and right-sided pedicle screw fusion at L4-5.
Although the L5 pedicle screw is fractured, the posterolateral
fusion appears solid. At L4-5, the spinal canal is adequately
decompressed and there is only mild left foraminal stenosis.
2. Advanced facet disease at L5-S1 contributes to moderate
biforaminal stenosis. The spinal canal is adequately decompressed by
the prior laminectomies.
3. Moderate to severe spinal stenosis at L2-3 and severe spinal
stenosis at L3-4 due to annular disc bulging and facet hypertrophy.
There is an associated grade 1 anterolisthesis at L3-4.

## 2016-07-16 ENCOUNTER — Other Ambulatory Visit (HOSPITAL_COMMUNITY): Payer: Self-pay | Admitting: Physician Assistant

## 2016-07-16 DIAGNOSIS — Z1231 Encounter for screening mammogram for malignant neoplasm of breast: Secondary | ICD-10-CM

## 2016-08-02 ENCOUNTER — Ambulatory Visit (HOSPITAL_COMMUNITY): Payer: Medicare PPO

## 2016-08-02 ENCOUNTER — Ambulatory Visit (HOSPITAL_COMMUNITY)
Admission: RE | Admit: 2016-08-02 | Discharge: 2016-08-02 | Disposition: A | Discharge status: Home / Self Care | Payer: Medicare PPO | Source: Ambulatory Visit | Attending: Physician Assistant | Admitting: Physician Assistant

## 2016-08-02 DIAGNOSIS — Z1231 Encounter for screening mammogram for malignant neoplasm of breast: Secondary | ICD-10-CM | POA: Diagnosis present

## 2016-09-11 ENCOUNTER — Other Ambulatory Visit (HOSPITAL_COMMUNITY): Payer: Self-pay | Admitting: Physician Assistant

## 2016-09-11 DIAGNOSIS — E049 Nontoxic goiter, unspecified: Secondary | ICD-10-CM

## 2016-09-24 ENCOUNTER — Ambulatory Visit (HOSPITAL_COMMUNITY)
Admission: RE | Admit: 2016-09-24 | Discharge: 2016-09-24 | Disposition: A | Discharge status: Home / Self Care | Payer: Medicare PPO | Source: Ambulatory Visit | Attending: Physician Assistant | Admitting: Physician Assistant

## 2016-09-24 DIAGNOSIS — E049 Nontoxic goiter, unspecified: Secondary | ICD-10-CM

## 2016-09-27 ENCOUNTER — Other Ambulatory Visit (HOSPITAL_COMMUNITY): Payer: Self-pay | Admitting: Physician Assistant

## 2016-09-30 ENCOUNTER — Other Ambulatory Visit (HOSPITAL_COMMUNITY): Payer: Self-pay | Admitting: Physician Assistant

## 2016-09-30 DIAGNOSIS — E041 Nontoxic single thyroid nodule: Secondary | ICD-10-CM

## 2016-10-08 ENCOUNTER — Ambulatory Visit: Payer: Self-pay | Admitting: "Endocrinology

## 2016-10-23 ENCOUNTER — Ambulatory Visit (HOSPITAL_COMMUNITY)
Admission: RE | Admit: 2016-10-23 | Discharge: 2016-10-23 | Disposition: A | Discharge status: Home / Self Care | Payer: Medicare PPO | Source: Ambulatory Visit | Attending: Physician Assistant | Admitting: Physician Assistant

## 2016-10-23 ENCOUNTER — Encounter (HOSPITAL_COMMUNITY): Payer: Self-pay

## 2016-10-23 DIAGNOSIS — E041 Nontoxic single thyroid nodule: Secondary | ICD-10-CM | POA: Insufficient documentation

## 2016-10-23 MED ORDER — LIDOCAINE HCL (PF) 2 % IJ SOLN
INTRAMUSCULAR | Status: AC
  Filled 2016-10-23: qty 10

## 2016-10-23 NOTE — Procedures (Signed)
PreOperative Dx: LEFT thyroid nodule Postoperative Dx: LEFT thyroid nodule Procedure:   US guided FNA of LEFT thyroid nodule Radiologist:  Thornton Papas Anesthesia:  2 ml of 2% lidocaine Specimen:  FNA x 3  EBL:   < 1 ml Complications: None

## 2016-10-23 NOTE — Discharge Instructions (Signed)
Thyroid Biopsy °The thyroid gland is a butterfly-shaped gland located in the front of the neck. It produces hormones that affect metabolism, growth and development, and body temperature. Thyroid biopsy is a procedure in which small samples of tissue or fluid are removed from the thyroid gland. The samples are then looked at under a microscope to check for abnormalities. This procedure is done to determine the cause of thyroid problems. It may be done to check for infection, cancer, or other thyroid problems. °Two methods may be used for a thyroid biopsy. In one method, a thin needle is inserted through the skin and into the thyroid gland. In the other method, an open incision is made through the skin. °Tell a health care provider about: °· Any allergies you have. °· All medicines you are taking, including vitamins, herbs, eye drops, creams, and over-the-counter medicines. °· Any problems you or family members have had with anesthetic medicines. °· Any blood disorders you have. °· Any surgeries you have had. °· Any medical conditions you have. °What are the risks? °Generally, this is a safe procedure. However, problems can occur and include: °· Bleeding from the procedure site. °· Infection. °· Injury to structures near the thyroid gland. °What happens before the procedure? °· Ask your health care provider about: °¨ Changing or stopping your regular medicines. This is especially important if you are taking diabetes medicines or blood thinners. °¨ Taking medicines such as aspirin and ibuprofen. These medicines can thin your blood. Do not take these medicines before your procedure if your health care provider asks you not to. °· Do not eat or drink anything after midnight on the night before the procedure or as directed by your health care provider. °· You may have a blood sample taken. °What happens during the procedure? °Either of these methods may be used to perform a thyroid biopsy: °· Fine needle biopsy. You may  be given medicine to help you relax (sedative). You will be asked to lie on your back with your head tipped backward to extend your neck. An area on your neck will be cleaned. A needle will then be inserted through the skin of your neck. You may be asked to avoid coughing, talking, swallowing, or making sounds during some portions of the procedure. The needle will be withdrawn once the tissue or fluid samples have been removed. Pressure may be applied to your neck to reduce swelling and ensure that bleeding has stopped. The samples will be sent to a lab for examination. °· Open biopsy. You will be given medicine to make you sleep (general anesthetic). An incision will be made in your neck. A sample of thyroid tissue will be removed using surgical tools. The tissue sample will be sent for examination. In some cases, the sample may be examined during the biopsy. If that is done and cancer cells are found, some or all of the thyroid gland may be removed. The incision will be closed with stitches. °What happens after the procedure? °· Your recovery will be assessed and monitored. °· You may have soreness and tenderness at the site of the biopsy. This should go away after a few days. °· If you had an open biopsy, you may have a hoarse voice or sore throat for a couple days. °· It is your responsibility to get your test results. °This information is not intended to replace advice given to you by your health care provider. Make sure you discuss any questions you have with your health   care provider. °Document Released: 05/26/2007 Document Revised: 03/31/2016 Document Reviewed: 10/21/2013 °[employer] Interactive Patient Education © 2017 [employer] ° °

## 2016-12-10 ENCOUNTER — Encounter: Payer: Self-pay | Admitting: "Endocrinology

## 2016-12-10 ENCOUNTER — Ambulatory Visit (INDEPENDENT_AMBULATORY_CARE_PROVIDER_SITE_OTHER): Payer: Medicare PPO | Admitting: "Endocrinology

## 2016-12-10 VITALS — BP 149/92 | HR 103 | Ht 63.0 in | Wt 157.0 lb

## 2016-12-10 DIAGNOSIS — E042 Nontoxic multinodular goiter: Secondary | ICD-10-CM

## 2016-12-10 LAB — T3, FREE: T3 FREE: 3.6 pg/mL (ref 2.3–4.2)

## 2016-12-10 LAB — T4, FREE: FREE T4: 1.1 ng/dL (ref 0.8–1.8)

## 2016-12-10 LAB — TSH: TSH: 0.16 mIU/L — ABNORMAL LOW

## 2016-12-10 NOTE — Progress Notes (Signed)
Subjective:    Patient ID: Kristina Huang, female    DOB: [birth year], PCP Antionette Fairy, PA-C   Past Medical History:  Diagnosis Date  . Acid reflux   . Arthritis   . Diabetes mellitus without complication (HCC)    fasting 100-130  . Hypercholesterolemia   . 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 BACK SURGERY     spinal fusion  . BUNIONECTOMY Bilateral   . CHOLECYSTECTOMY     Dr. Irving Shows  . COLONOSCOPY  2009   Dr. Oneida Alar: moderate internal hemorrhoids, frequent sigmoid colon diverticula, polyps X 3, one large adenoma otherwise hyperplastic  . COLONOSCOPY N/A 09/09/2014   Procedure: COLONOSCOPY;  Surgeon: Danie Binder, MD;  Location: [facility];  Service: Endoscopy;  Laterality: N/A;  200 - moved to 1/29 @ 12:45  . ESOPHAGOGASTRODUODENOSCOPY N/A 09/09/2014   Procedure: ESOPHAGOGASTRODUODENOSCOPY (EGD);  Surgeon: Danie Binder, MD;  Location: [facility];  Service: Endoscopy;  Laterality: N/A;  . TUBAL LIGATION     Social History   Social History  . Marital status: Divorced    Spouse name: N/A  . Number of children: N/A  . Years of education: N/A   Social History Main Topics  . Smoking status: Never Smoker  . Smokeless tobacco: Never Used  . Alcohol use No  . Drug use: No  . Sexual activity: Not Asked   Other Topics Concern  . None   Social History Narrative  . None   Outpatient Encounter Prescriptions as of 12/10/2016  Medication Sig  . acetaminophen (TYLENOL) 650 MG CR tablet Take 1,300 mg by mouth daily as needed for pain.  Marland Kitchen aspirin EC 81 MG tablet Take 81 mg by mouth at bedtime.   . diclofenac (VOLTAREN) 75 MG EC tablet Take 1 tablet (75 mg total) by mouth 2 (two) times daily. Take with food  . dicyclomine (BENTYL) 10 MG capsule Take 1 capsule (10 mg total) by mouth 4 (four) times daily -  before meals and at bedtime. (Patient not taking: Reported on 03/31/2015)  .  fluticasone (FLONASE) 50 MCG/ACT nasal spray Place 1 spray into both nostrils daily as needed for allergies.   Marland Kitchen HYDROcodone-acetaminophen (NORCO/VICODIN) 5-325 MG per tablet Take one tab po q 4-6 hrs prn pain  . lovastatin (MEVACOR) 20 MG tablet Take 20 mg by mouth at bedtime.  . metFORMIN (GLUCOPHAGE) 500 MG tablet Take 500 mg by mouth daily with breakfast.  . omeprazole (PRILOSEC) 20 MG capsule Take 20 mg by mouth daily.   No facility-administered encounter medications on file as of 12/10/2016.    ALLERGIES: No Known Allergies  VACCINATION STATUS:  There is no immunization history on file for this patient.  HPI 70 year old female patient with medical history as above. She is being seen in consultation for multinodular goiter requested by Neysa Hotter B, PA-C. - Based on review of her records, she was known to have multinodular goiter for at least 7 years. Serious of thyroid/neck ultrasound results showed that the thyroid continues to grow over time as well as increasing nodule size in bilateral lobes. - On further interview, she admits that she is feeling choking sensations on and off and shortness of breath intermittently. - She denies any family history of thyroid cancer, however one of her sisters have hyperthyroidism status post treatment with what appears to be radioactive iodine therapy. -  She does not have recent thyroid function tests. Thyroid uptake and scan in April 2011 was normal at 13% and suggested multinodular gland. - She has lost approximately 15 pounds progressively over the last 2 years. She denies heat/cold intolerance.  Review of Systems  Constitutional: + Progressive weight oss, no fatigue, no subjective hyperthermia, no subjective hypothermia Eyes: no blurry vision, no xerophthalmia ENT: no sore throat, + documented goiter and nodules seem that thyroid, + dysphagia/odynophagia, no hoarseness Cardiovascular: no Chest Pain, no Shortness of Breath, no palpitations,  no leg swelling Respiratory: no cough, no SOB Gastrointestinal: no Nausea/Vomiting/Diarhhea Musculoskeletal: no muscle/joint aches Skin: no rashes Neurological: no tremors, no numbness, no tingling, no dizziness Psychiatric: no depression, no anxiety  Objective:    BP (!) 149/92   Pulse (!) 103   Ht 5\' 3"  (1.6 m)   Wt 157 lb (71.2 kg)   BMI 27.81 kg/m   Wt Readings from Last 3 Encounters:  12/10/16 157 lb (71.2 kg)  03/31/15 166 lb (75.3 kg)  08/23/14 170 lb (77.1 kg)    Physical Exam  Constitutional: Slightly over weight for hight, not in acute distress, normal state of mind Eyes: PERRLA, EOMI, no exophthalmos ENT: moist mucous membranes, significant thyromegaly, thyroid up to 2 times normal, no cervical lymphadenopathy Cardiovascular: normal precordial activity, Regular Rate and Rhythm, no Murmur/Rubs/Gallops Respiratory:  adequate breathing efforts, no gross chest deformity, Clear to auscultation bilaterally Gastrointestinal: abdomen soft, Non -tender, No distension, Bowel Sounds present Musculoskeletal: no gross deformities, strength intact in all four extremities Skin: moist, warm, no rashes Neurological: no tremor with outstretched hands, Deep tendon reflexes normal in all four extremities.  CMP ( most recent) CMP     Component Value Date/Time   NA 141 06/02/2014 0843   K 3.7 06/02/2014 0843   CL 102 06/02/2014 0843   CO2 26 06/02/2014 0843   GLUCOSE 135 (H) 06/02/2014 0843   BUN 9 06/02/2014 0843   CREATININE 0.70 06/02/2014 0843   CALCIUM 9.8 06/02/2014 0843   PROT 7.8 06/02/2014 0843   ALBUMIN 3.8 06/02/2014 0843   AST 25 06/02/2014 0843   ALT 21 06/02/2014 0843   ALKPHOS 104 06/02/2014 0843   BILITOT 0.5 06/02/2014 0843   GFRNONAA 87 (L) 06/02/2014 0843   GFRAA >90 06/02/2014 0843   Review of her most recent thyroid ultrasound from February 2018 showed: Right lobe enlarged at 6.2 x 2.0X2.4 cm and has to nodules 1.7 cm and 1.5 cm.  Left lobe also  progressively enlarging to 8.8 X5.7X 520 cm with 1 increasing nodule at 5 cm- there is increased risk category, increased volume and punctate echogenic foci.  She has no recent thyroid function test to review.    Fine-needle aspiration cytology of the thyroid on 10/23/2016 showed: THYROID, FINE NEEDLE ASPIRATION, LEFT (SPECIMEN 1 OF 1, COLLECTED 10/23/16): CONSISTENT WITH BENIGN FOLLICULAR NODULE (BETHESDA CATEGORY II).  Assessment & Plan:   1. Multinodular goiter - Patient is being seen at the kind request of BAUCOM, JENNY B, PA-C. - I have Review her records and clinically evaluated patient. - The largest nodule in her thyroid is a 5 cm nodule in the left lobe which was recently biopsied and showed benign follicular nodule. - The shear size  of the thyroid already causing some local compressive symptoms and unfavorable radiologic findings of the largest nodule makes it necessary for her to undergo total thyroidectomy with subsequent replacement of thyroid hormone. - This will afford her relief of compressive symptoms as  well as a surgical sample to study for any occult malignancy. - I approached her with this plan. However patient is hesitant and would like to think about it. - However, she agrees to do thyroid function tests Today. I will order thyroid function tests including TSH/free T4/free T3 and she will return in 2 weeks to discuss. - 45 minutes of time was spent on the care of this patient , 50% of which was applied for counseling on multinodular goiter and the need for complete study of them.  - I advised patient to maintain close follow up with BAUCOM, JENNY B, PA-C for primary care needs. Follow up plan: Return in about 2 weeks (around 12/24/2016) for labs today.  Glade Lloyd, MD Phone: [phone]  Fax: [phone]   12/10/2016, 10:12 AM

## 2016-12-27 ENCOUNTER — Encounter: Payer: Self-pay | Admitting: "Endocrinology

## 2016-12-27 ENCOUNTER — Ambulatory Visit (INDEPENDENT_AMBULATORY_CARE_PROVIDER_SITE_OTHER): Payer: Medicare PPO | Admitting: "Endocrinology

## 2016-12-27 VITALS — BP 139/85 | HR 96 | Ht 63.0 in | Wt 157.0 lb

## 2016-12-27 DIAGNOSIS — E042 Nontoxic multinodular goiter: Secondary | ICD-10-CM

## 2016-12-27 NOTE — Progress Notes (Signed)
Subjective:    Patient ID: Kristina Huang, female    DOB: 09-Mar-1946, PCP Sharee Pimple Lennon Alstrom, PA-C   Past Medical History:  Diagnosis Date  . Acid reflux   . Arthritis   . Diabetes mellitus without complication (HCC)    fasting 100-130  . Hypercholesterolemia   . 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 BACK SURGERY     spinal fusion  . BUNIONECTOMY Bilateral   . CHOLECYSTECTOMY     Dr. Irving Shows  . COLONOSCOPY  2009   Dr. Oneida Alar: moderate internal hemorrhoids, frequent sigmoid colon diverticula, polyps X 3, one large adenoma otherwise hyperplastic  . COLONOSCOPY N/A 09/09/2014   Procedure: COLONOSCOPY;  Surgeon: Danie Binder, MD;  Location: [facility];  Service: Endoscopy;  Laterality: N/A;  200 - moved to 1/29 @ 12:45  . ESOPHAGOGASTRODUODENOSCOPY N/A 09/09/2014   Procedure: ESOPHAGOGASTRODUODENOSCOPY (EGD);  Surgeon: Danie Binder, MD;  Location: [facility];  Service: Endoscopy;  Laterality: N/A;  . TUBAL LIGATION     Social History   Social History  . Marital status: Divorced    Spouse name: N/A  . Number of children: N/A  . Years of education: N/A   Social History Main Topics  . Smoking status: Never Smoker  . Smokeless tobacco: Never Used  . Alcohol use No  . Drug use: No  . Sexual activity: Not Asked   Other Topics Concern  . None   Social History Narrative  . None   Outpatient Encounter Prescriptions as of 12/27/2016  Medication Sig  . acetaminophen (TYLENOL) 650 MG CR tablet Take 1,300 mg by mouth daily as needed for pain.  Marland Kitchen aspirin EC 81 MG tablet Take 81 mg by mouth at bedtime.   . diclofenac (VOLTAREN) 75 MG EC tablet Take 1 tablet (75 mg total) by mouth 2 (two) times daily. Take with food  . dicyclomine (BENTYL) 10 MG capsule Take 1 capsule (10 mg total) by mouth 4 (four) times daily -  before meals and at bedtime. (Patient not taking: Reported on 03/31/2015)  .  fluticasone (FLONASE) 50 MCG/ACT nasal spray Place 1 spray into both nostrils daily as needed for allergies.   Marland Kitchen HYDROcodone-acetaminophen (NORCO/VICODIN) 5-325 MG per tablet Take one tab po q 4-6 hrs prn pain  . lovastatin (MEVACOR) 20 MG tablet Take 20 mg by mouth at bedtime.  . metFORMIN (GLUCOPHAGE) 500 MG tablet Take 500 mg by mouth daily with breakfast.  . omeprazole (PRILOSEC) 20 MG capsule Take 20 mg by mouth daily.   No facility-administered encounter medications on file as of 12/27/2016.    ALLERGIES: No Known Allergies  VACCINATION STATUS:  There is no immunization history on file for this patient.  HPI 71 year old female patient with medical history as above. She is being seen in f/u for multinodular goiter requested by Neysa Hotter B, PA-C. - Based on review of her records, she was known to have multinodular goiter for at least 7 years. Serious of thyroid/neck ultrasound results showed that the thyroid continues to grow over time as well as increasing nodule size in bilateral lobes. - On further interview, she admits that she is feeling choking sensations on and off and shortness of breath intermittently. - She denies any family history of thyroid cancer, however one of her sisters have hyperthyroidism status post treatment with what appears to be radioactive iodine therapy. -  her recent thyroid function tests are consistent with subclinical hyperthyroidism which would not need any intervention at this time.  Thyroid uptake and scan in April 2011 was normal at 13% and suggested multinodular gland. - She has lost approximately 15 pounds progressively over the last 2 years. She denies heat/cold intolerance.  Review of Systems  Constitutional: + Progressive weight oss, no fatigue, no subjective hyperthermia, no subjective hypothermia Eyes: no blurry vision, no xerophthalmia ENT: no sore throat, + documented goiter and nodules seem that thyroid, + dysphagia/odynophagia, no  hoarseness Cardiovascular: no Chest Pain, no Shortness of Breath, no palpitations, no leg swelling Respiratory: no cough, no SOB Gastrointestinal: no Nausea/Vomiting/Diarhhea Musculoskeletal: no muscle/joint aches Skin: no rashes Neurological: no tremors, no numbness, no tingling, no dizziness Psychiatric: no depression, no anxiety  Objective:    BP 139/85   Pulse 96   Ht 5\' 3"  (1.6 m)   Wt 157 lb (71.2 kg)   BMI 27.81 kg/m   Wt Readings from Last 3 Encounters:  12/27/16 157 lb (71.2 kg)  12/10/16 157 lb (71.2 kg)  03/31/15 166 lb (75.3 kg)    Physical Exam  Constitutional: Slightly over weight for hight, not in acute distress, normal state of mind Eyes: PERRLA, EOMI, no exophthalmos ENT: moist mucous membranes, significant thyromegaly, thyroid up to 2 times normal, no cervical lymphadenopathy Cardiovascular: normal precordial activity, Regular Rate and Rhythm, no Murmur/Rubs/Gallops Respiratory:  adequate breathing efforts, no gross chest deformity, Clear to auscultation bilaterally Gastrointestinal: abdomen soft, Non -tender, No distension, Bowel Sounds present Musculoskeletal: no gross deformities, strength intact in all four extremities Skin: moist, warm, no rashes Neurological: no tremor with outstretched hands, Deep tendon reflexes normal in all four extremities.  CMP ( most recent) CMP     Component Value Date/Time   NA 141 06/02/2014 0843   K 3.7 06/02/2014 0843   CL 102 06/02/2014 0843   CO2 26 06/02/2014 0843   GLUCOSE 135 (H) 06/02/2014 0843   BUN 9 06/02/2014 0843   CREATININE 0.70 06/02/2014 0843   CALCIUM 9.8 06/02/2014 0843   PROT 7.8 06/02/2014 0843   ALBUMIN 3.8 06/02/2014 0843   AST 25 06/02/2014 0843   ALT 21 06/02/2014 0843   ALKPHOS 104 06/02/2014 0843   BILITOT 0.5 06/02/2014 0843   GFRNONAA 87 (L) 06/02/2014 0843   GFRAA >90 06/02/2014 0843   Review of her most recent thyroid ultrasound from February 2018 showed: Right lobe enlarged at 6.2  x 2.0X2.4 cm and has to nodules 1.7 cm and 1.5 cm.  Left lobe also progressively enlarging to 8.8 X5.7X 520 cm with 1 increasing nodule at 5 cm- there is increased risk category, increased volume and punctate echogenic foci.  She has no recent thyroid function test to review.    Fine-needle aspiration cytology of the thyroid on 10/23/2016 showed: THYROID, FINE NEEDLE ASPIRATION, LEFT (SPECIMEN 1 OF 1, COLLECTED 10/23/16): CONSISTENT WITH BENIGN FOLLICULAR NODULE (BETHESDA CATEGORY II).  Assessment & Plan:   1. Multinodular goiter - Patient is being seen at the kind request of BAUCOM, JENNY B, PA-C. - I have Review her records and clinically evaluated patient. Her repeat thyroid function tests are consistent with subclinical hyperthyroidism which would not need intervention at this time.   - The largest nodule in her thyroid is a 5 cm nodule in the left lobe which was recently biopsied and showed benign follicular nodule. - The shear size  of the thyroid already causing some local compressive symptoms and unfavorable radiologic  findings of the largest nodule makes it necessary for her to undergo total thyroidectomy with subsequent replacement of thyroid hormone. - This will afford her relief of compressive symptoms as well as a surgical sample to study for any occult malignancy. - I approached her with this plan.   - She is open for this option and will be referred to Dr. Arnoldo Morale for total thyroidectomy. She is made aware of the fact that she will likely need thyroid hormone replacement subsequent to her surgery.  - I advised patient to maintain close follow up with Antionette Fairy, PA-C for primary care needs. Follow up plan: Return in about 3 months (around 03/29/2017) for follow up with labs after surgery.  Glade Lloyd, MD Phone: [phone]  Fax: [phone]   12/27/2016, 11:43 AM

## 2017-01-21 ENCOUNTER — Encounter: Payer: Self-pay | Admitting: General Surgery

## 2017-01-21 ENCOUNTER — Ambulatory Visit (INDEPENDENT_AMBULATORY_CARE_PROVIDER_SITE_OTHER): Payer: Medicare PPO | Admitting: General Surgery

## 2017-01-21 VITALS — BP 169/99 | HR 88 | Temp 97.5°F | Resp 18 | Ht 63.0 in | Wt 158.0 lb

## 2017-01-21 DIAGNOSIS — E042 Nontoxic multinodular goiter: Secondary | ICD-10-CM

## 2017-01-21 NOTE — Progress Notes (Signed)
Kristina Huang; [id number]; Jul 16, 1946   HPI Patient is a 71 year old white female who was referred here by Dr. Dorris Fetch for evaluation and treatment of multinodular goiter.  This has been followed for many years.  She has had biopsies in the past which have been negative for malignancy.  She has had increased growth of the thyroid gland, especially the left lobe.  She has been euthyroid.  She does have some dysphagia due to the growth.  She denies any voice changes, weight changes, heat intolerance, or family history of thyroid cancer.  She currently has no pain. Past Medical History:  Diagnosis Date  . Acid reflux   . Arthritis   . Diabetes mellitus without complication (HCC)    fasting 100-130  . Hypercholesterolemia   . 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 BACK SURGERY     spinal fusion  . BUNIONECTOMY Bilateral   . CHOLECYSTECTOMY     Dr. Irving Shows  . COLONOSCOPY  2009   Dr. Oneida Alar: moderate internal hemorrhoids, frequent sigmoid colon diverticula, polyps X 3, one large adenoma otherwise hyperplastic  . COLONOSCOPY N/A 09/09/2014   Procedure: COLONOSCOPY;  Surgeon: Danie Binder, MD;  Location: [facility];  Service: Endoscopy;  Laterality: N/A;  200 - moved to 1/29 @ 12:45  . ESOPHAGOGASTRODUODENOSCOPY N/A 09/09/2014   Procedure: ESOPHAGOGASTRODUODENOSCOPY (EGD);  Surgeon: Danie Binder, MD;  Location: [facility];  Service: Endoscopy;  Laterality: N/A;  . TUBAL LIGATION      Family History  Problem Relation Age of Onset  . Colon cancer Neg Hx     Current Outpatient Prescriptions on File Prior to Visit  Medication Sig Dispense Refill  . acetaminophen (TYLENOL) 650 MG CR tablet Take 1,300 mg by mouth daily as needed for pain.    Marland Kitchen aspirin EC 81 MG tablet Take 81 mg by mouth at bedtime.     . diclofenac (VOLTAREN) 75 MG EC tablet Take 1 tablet (75 mg total) by mouth 2 (two) times daily. Take  with food 14 tablet 0  . dicyclomine (BENTYL) 10 MG capsule Take 1 capsule (10 mg total) by mouth 4 (four) times daily -  before meals and at bedtime. (Patient not taking: Reported on 03/31/2015) 120 capsule 3  . fluticasone (FLONASE) 50 MCG/ACT nasal spray Place 1 spray into both nostrils daily as needed for allergies.     Marland Kitchen HYDROcodone-acetaminophen (NORCO/VICODIN) 5-325 MG per tablet Take one tab po q 4-6 hrs prn pain 8 tablet 0  . lovastatin (MEVACOR) 20 MG tablet Take 20 mg by mouth at bedtime.    . metFORMIN (GLUCOPHAGE) 500 MG tablet Take 500 mg by mouth daily with breakfast.    . omeprazole (PRILOSEC) 20 MG capsule Take 20 mg by mouth daily.     No current facility-administered medications on file prior to visit.     No Known Allergies  History  Alcohol Use No    History  Smoking Status  . Never Smoker  Smokeless Tobacco  . Never Used    Review of Systems  Constitutional: Negative.   HENT: Positive for sinus pain.   Eyes: Positive for blurred vision and pain.  Respiratory: Positive for cough.   Cardiovascular: Negative.   Gastrointestinal: Positive for heartburn.  Genitourinary: Positive for frequency.  Musculoskeletal: Positive for back pain, joint pain and neck pain.  Skin: Negative.   Neurological: Negative.  Endo/Heme/Allergies: Negative.   Psychiatric/Behavioral: Negative.     Objective   Vitals:   01/21/17 1103  BP: (!) 169/99  Pulse: 88  Resp: 18  Temp: 97.5 F (36.4 C)    Physical Exam  Constitutional: She is oriented to person, place, and time and well-developed, well-nourished, and in no distress.  HENT:  Head: Normocephalic and atraumatic.  Neck: Normal range of motion. Neck supple. Thyromegaly present.  Thyroid gland is diffusely enlarged with nodularity, left greater than right.  Trachea has some deviation to the right.  Cardiovascular: Normal rate, regular rhythm and normal heart sounds.   Pulmonary/Chest: Effort normal and breath sounds  normal. No stridor. She has no wheezes. She has no rales.  Lymphadenopathy:    She has no cervical adenopathy.  Neurological: She is alert and oriented to person, place, and time.  Skin: Skin is warm and dry.  Vitals reviewed.  Dr. Liliane Channel notes reviewed.  Ultrasound report reviewed. Assessment    Multinodular goiter Plan    patient is scheduled for a total thyroidectomy on 02/05/2017.  The risks and benefits of the procedure including bleeding, infection, nerve injury, voice changes were fully explained to the patient, who gave informed consent.  She does realize that she will be on lifelong Synthroid supplementation therapy after surgery.

## 2017-01-21 NOTE — Patient Instructions (Signed)
Thyroidectomy A thyroidectomy is a surgery that is done to remove all (total thyroidectomy) or part (subtotal thyroidectomy) of your thyroid gland. The thyroid is a butterfly-shaped gland that is located at the lower front of your neck. It produces a substance that helps to control certain body processes (thyroid hormone). The amount of thyroid gland tissue that is removed during your thyroidectomy depends on the reason you need the procedure. You may have a thyroidectomy to treat conditions including:  Thyroid nodules.  Thyroid cancer.  Benign thyroid tumors.  Goiter.  Overactive thyroid gland (hyperthyroidism).  There are different ways to do a thyroidectomy:  Conventional thyroidectomy (open thyroidectomy). This procedure is the most common. In this procedure, the thyroid gland is removed through one surgical cut (incision) in the neck.  Endoscopic thyroidectomy. This procedure is less invasive. In this procedure, there may be several smaller incisions in the neck, chest, or armpit. The surgeon uses a tiny camera and other assistive tools to remove the thyroid gland.  Tell a health care provider about:  Any allergies you have.  All medicines you are taking, including vitamins, herbs, eye drops, creams, and over-the-counter medicines.  Any problems you or family members have had with anesthetic medicines.  Any blood disorders you have.  Any surgeries you have had.  Any medical conditions you have. What are the risks? Generally, this is a safe procedure. However, problems can occur and include:  A decrease in parathyroid hormone levels (hypoparathyroidism). Your parathyroid glands are located behind your thyroid gland, and they maintain the calcium level in your body. If these glands are damaged during surgery, your calcium level will drop. This will make your nerves irritable and cause muscle spasms.  An increase in thyroid hormone.  Damage to the nerves of your voice box  (larynx).  Bleeding.  Infection at the site of the incision or incisions.  Temporary breathing difficulties. This is a very rare complication. It usually goes away within weeks.  What happens before the procedure?  Your health care provider will perform a physical exam and assess your voice for vocal changes.  Ask your health care provider about: ? Changing or stopping your regular medicines. This is especially important if you are taking diabetes medicines or blood thinners. ? Taking medicines such as aspirin and ibuprofen. These medicines can thin your blood. Do not take these medicines before your procedure if your health care provider instructs you not to.  Follow instructions from your health care provider about eating or drinking restrictions. What happens during the procedure? You will be given a medicine that makes you go to sleep (general anesthetic). Depending on which type of thyroidectomy you have, this is what may happen during the procedure: Conventional Thyroidectomy  The surgeon will make an incision in the center of your lower neck.  The muscles in your neck will be separated to reveal your thyroid gland.  Part or all of your thyroid gland will be removed.  You may need a tube (catheter) at the incision site to drain blood and fluids that accumulate under the skin after the procedure.The catheter may have to stay in place for a day or two after the procedure.  The incision will be closed with stitches (sutures). Endoscopic Thyroidectomy  The surgeon will make several small incisions in your neck, chest, or armpit.  The surgeon will use a narrow tube with a light and camera at the end (endoscope). The surgeon will insert the endoscope into an incision.  Part or   all of your thyroid gland will be removed.  You may need a catheter at the incision site to drain blood and fluids that accumulate under the skin after the procedure.The catheter may have to stay in  place for a day or two after the procedure.  The incision will be closed with sutures. What happens after the procedure?  Your blood pressure, heart rate, breathing rate, and blood oxygen level will be monitored often until the medicines you were given have worn off.  Depending on the type of thyroidectomy you had, you may have: ? A swollen neck. ? Some mild neck pain. ? A slightly sore throat. ? A weak voice.  You will not be able to eat or drink until your health care provider says it is okay.  You may have a blood test to check the level of calcium in your body.  If you had a catheter put in during the procedure, it will usually be removed the next day. This information is not intended to replace advice given to you by your health care provider. Make sure you discuss any questions you have with your health care provider. Document Released: 01/22/2001 Document Revised: 03/31/2016 Document Reviewed: 12/29/2013 Elsevier Interactive Patient Education  2018 [place] A goiter is an enlarged thyroid gland. The thyroid gland is located in the lower front of the neck. The gland produces hormones that regulate mood, body temperature, pulse rate, and digestion. Most goiters are painless and are not a cause for serious concern. Goiters and conditions that cause goiters can be treated, if necessary. What are the causes? Causes of this condition include:  Diseases that attack healthy cells in your body (autoimmune diseases) and affect your thyroid function, such as: ? Graves disease. This causes too much thyroid hormone to be produced and it makes your thyroid overly active (hyperthyroidism). ? Hashimoto disease. This type of inflammation of the thyroid (thyroiditis) causes too little thyroid hormone to be produced and it makes your thyroid not active enough (hypothyroidism).  Other conditions that cause thyroiditis.  Nodular goiter. This means that there are one or more small  growths on your thyroid. These can create too much thyroid hormone.  Pregnancy.  Thyroid cancer. This is rare.  Certain medicines.  Radiation exposure.  Iodine deficiency.  In some cases, the cause may not be known (idiopathic). What increases the risk? This condition is more likely to develop in:  People who have a family history of goiter.  Women.  People who do not get enough iodine in their diet.  People who are older than 24.  People who smoke tobacco.  What are the signs or symptoms? Common symptoms of this condition include:  Swelling in the lower part of the neck. This swelling can range from a very small bump to a large lump.  A tight feeling in the throat.  A hoarse voice.  Other symptoms include:  Coughing.  Wheezing.  Difficulty swallowing.  Difficulty breathing.  Bulging neck veins.  Dizziness.  In some cases, there are no symptoms and thyroid hormone levels may be normal. When a goiter is the result of hyperthyroidism, symptoms may also include:  Nervousness or restlessness.  Inability to tolerate heat.  Unexplained weight loss.  Diarrhea.  Change in the texture of hair or skin.  Changes in heart beat, such as skipped beats, extra beats, or a rapid heart rate.  Loss of menstruation.  Shaky hands.  Increased appetite.  Sleep problems.  When a goiter  is the result of hypothyroidism, symptoms may also include:  Feeling like you have no energy (lethargy).  Inability to tolerate cold.  Weight gain that is not explained by a change in diet or exercise habits.  Dry skin.  Coarse hair.  Menstrual irregularity.  Constipation.  Sadness or depression.  How is this diagnosed? This condition may be diagnosed with a medical history and physical exam. You may also have other tests, including:  Blood tests to check thyroid function.  Imaging tests, such as: ? Ultrasonography. ? CT scan. ? MRI. ? Thyroid scan. You will be  given a safe radioactive injection, then images will be taken of your thyroid.  Tissue sample (biopsy) of the goiter or any nodules. This checks to see if the goiter or nodules are cancerous.  How is this treated? Treatment for this condition depends on the cause. Treatment may include:  Medicines to control your thyroid.  Anti-inflammatory or steroid medicines, if inflammation is the cause.  Iodine supplements or changes in diet, if the goiter is caused by iodine deficiency.  Radiation therapy.  Surgery to remove your thyroid.  In some cases, no treatment is necessary, and your health care provider will monitor your condition at regular checkups. Follow these instructions at home:  Follow recommendations from your health care provider for any changes to your diet.  Take over-the-counter and prescription medicines only as told by your health care provider.  Do not use any tobacco products, including cigarettes, chewing tobacco, or e-cigarettes. If you need help quitting, ask your health care provider.  Keep all follow-up appointments as told by your health care provider. This is important. Contact a health care provider if:  Your symptoms do not get better with treatment. Get help right away if:  You develop sudden, unexplained confusion or other mental changes.  You have nausea, vomiting, or diarrhea.  You develop a fever.  Your skin or the whites of your eyes appear yellow (jaundice).  You develop chest pain.  You have trouble breathing or swallowing.  You suddenly become very weak.  You experience extreme restlessness. This information is not intended to replace advice given to you by your health care provider. Make sure you discuss any questions you have with your health care provider. Document Released: 01/16/2010 Document Revised: 02/16/2016 Document Reviewed: 07/25/2014 Elsevier Interactive Patient Education  [employer].

## 2017-01-21 NOTE — H&P (Signed)
Kristina Huang; [id number]; 1945/11/29   HPI Patient is a 71 year old white female who was referred here by Dr. Dorris Fetch for evaluation and treatment of multinodular goiter.  This has been followed for many years.  She has had biopsies in the past which have been negative for malignancy.  She has had increased growth of the thyroid gland, especially the left lobe.  She has been euthyroid.  She does have some dysphagia due to the growth.  She denies any voice changes, weight changes, heat intolerance, or family history of thyroid cancer.  She currently has no pain.     Past Medical History:  Diagnosis Date  . Acid reflux   . Arthritis   . Diabetes mellitus without complication (HCC)    fasting 100-130  . Hypercholesterolemia   . 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 BACK SURGERY     spinal fusion  . BUNIONECTOMY Bilateral   . CHOLECYSTECTOMY     Dr. Irving Shows  . COLONOSCOPY  2009   Dr. Oneida Alar: moderate internal hemorrhoids, frequent sigmoid colon diverticula, polyps X 3, one large adenoma otherwise hyperplastic  . COLONOSCOPY N/A 09/09/2014   Procedure: COLONOSCOPY;  Surgeon: Danie Binder, MD;  Location: [facility];  Service: Endoscopy;  Laterality: N/A;  200 - moved to 1/29 @ 12:45  . ESOPHAGOGASTRODUODENOSCOPY N/A 09/09/2014   Procedure: ESOPHAGOGASTRODUODENOSCOPY (EGD);  Surgeon: Danie Binder, MD;  Location: [facility];  Service: Endoscopy;  Laterality: N/A;  . TUBAL LIGATION           Family History  Problem Relation Age of Onset  . Colon cancer Neg Hx           Current Outpatient Prescriptions on File Prior to Visit  Medication Sig Dispense Refill  . acetaminophen (TYLENOL) 650 MG CR tablet Take 1,300 mg by mouth daily as needed for pain.    Marland Kitchen aspirin EC 81 MG tablet Take 81 mg by mouth at bedtime.     . diclofenac (VOLTAREN) 75 MG EC tablet Take 1  tablet (75 mg total) by mouth 2 (two) times daily. Take with food 14 tablet 0  . dicyclomine (BENTYL) 10 MG capsule Take 1 capsule (10 mg total) by mouth 4 (four) times daily -  before meals and at bedtime. (Patient not taking: Reported on 03/31/2015) 120 capsule 3  . fluticasone (FLONASE) 50 MCG/ACT nasal spray Place 1 spray into both nostrils daily as needed for allergies.     Marland Kitchen HYDROcodone-acetaminophen (NORCO/VICODIN) 5-325 MG per tablet Take one tab po q 4-6 hrs prn pain 8 tablet 0  . lovastatin (MEVACOR) 20 MG tablet Take 20 mg by mouth at bedtime.    . metFORMIN (GLUCOPHAGE) 500 MG tablet Take 500 mg by mouth daily with breakfast.    . omeprazole (PRILOSEC) 20 MG capsule Take 20 mg by mouth daily.     No current facility-administered medications on file prior to visit.     No Known Allergies     History  Alcohol Use No       History  Smoking Status  . Never Smoker  Smokeless Tobacco  . Never Used    Review of Systems  Constitutional: Negative.   HENT: Positive for sinus pain.   Eyes: Positive for blurred vision and pain.  Respiratory: Positive for cough.   Cardiovascular: Negative.   Gastrointestinal: Positive  for heartburn.  Genitourinary: Positive for frequency.  Musculoskeletal: Positive for back pain, joint pain and neck pain.  Skin: Negative.   Neurological: Negative.   Endo/Heme/Allergies: Negative.   Psychiatric/Behavioral: Negative.     Objective      Vitals:   01/21/17 1103  BP: (!) 169/99  Pulse: 88  Resp: 18  Temp: 97.5 F (36.4 C)    Physical Exam  Constitutional: She is oriented to person, place, and time and well-developed, well-nourished, and in no distress.  HENT:  Head: Normocephalic and atraumatic.  Neck: Normal range of motion. Neck supple. Thyromegaly present.  Thyroid gland is diffusely enlarged with nodularity, left greater than right.  Trachea has some deviation to the right.  Cardiovascular: Normal rate,  regular rhythm and normal heart sounds.   Pulmonary/Chest: Effort normal and breath sounds normal. No stridor. She has no wheezes. She has no rales.  Lymphadenopathy:    She has no cervical adenopathy.  Neurological: She is alert and oriented to person, place, and time.  Skin: Skin is warm and dry.  Vitals reviewed.  Dr. Liliane Channel notes reviewed.  Ultrasound report reviewed. Assessment    Multinodular goiter Plan    patient is scheduled for a total thyroidectomy on 02/05/2017.  The risks and benefits of the procedure including bleeding, infection, nerve injury, voice changes were fully explained to the patient, who gave informed consent.  She does realize that she will be on lifelong Synthroid supplementation therapy after surgery.

## 2017-01-29 NOTE — Patient Instructions (Signed)
Kristina Huang  01/29/2017     @PREFPERIOPPHARMACY @   Your procedure is scheduled on 02/05/2017.  Report to [facility] at 7:00 A.M.  Call this number if you have problems the morning of surgery:  [phone]   Remember:  Do not eat food or drink liquids after midnight.  Take these medicines the morning of surgery with A SIP OF WATER Voltaren, Bentyl, Flonase, Hydrocodone, Prilosec  DO NOT TAKE MEDICATION (METFORMIN) MORNING OF PROCEDURE   Do not wear jewelry, make-up or nail polish.  Do not wear lotions, powders, or perfumes, or deoderant.  Do not shave 48 hours prior to surgery.  Men may shave face and neck.  Do not bring valuables to the hospital.  [facility] is not responsible for any belongings or valuables.  Contacts, dentures or bridgework may not be worn into surgery.  Leave your suitcase in the car.  After surgery it may be brought to your room.  For patients admitted to the hospital, discharge time will be determined by your treatment team.  Patients discharged the day of surgery will not be allowed to drive home.    Please read over the following fact sheets that you were given. Surgical Site Infection Prevention and Anesthesia Post-op Instructions     PATIENT INSTRUCTIONS POST-ANESTHESIA  IMMEDIATELY FOLLOWING SURGERY:  Do not drive or operate machinery for the first twenty four hours after surgery.  Do not make any important decisions for twenty four hours after surgery or while taking narcotic pain medications or sedatives.  If you develop intractable nausea and vomiting or a severe headache please notify your doctor immediately.  FOLLOW-UP:  Please make an appointment with your surgeon as instructed. You do not need to follow up with anesthesia unless specifically instructed to do so.  WOUND CARE INSTRUCTIONS (if applicable):  Keep a dry clean dressing on the anesthesia/puncture wound site if there is drainage.  Once the wound has quit draining you may  leave it open to air.  Generally you should leave the bandage intact for twenty four hours unless there is drainage.  If the epidural site drains for more than 36-48 hours please call the anesthesia department.  QUESTIONS?:  Please feel free to call your physician or the hospital operator if you have any questions, and they will be happy to assist you.      Thyroidectomy A thyroidectomy is a surgery that is done to remove all (total thyroidectomy) or part (subtotal thyroidectomy) of your thyroid gland. The thyroid is a butterfly-shaped gland that is located at the lower front of your neck. It produces a substance that helps to control certain body processes (thyroid hormone). The amount of thyroid gland tissue that is removed during your thyroidectomy depends on the reason you need the procedure. You may have a thyroidectomy to treat conditions including:  Thyroid nodules.  Thyroid cancer.  Benign thyroid tumors.  Goiter.  Overactive thyroid gland (hyperthyroidism).  There are different ways to do a thyroidectomy:  Conventional thyroidectomy (open thyroidectomy). This procedure is the most common. In this procedure, the thyroid gland is removed through one surgical cut (incision) in the neck.  Endoscopic thyroidectomy. This procedure is less invasive. In this procedure, there may be several smaller incisions in the neck, chest, or armpit. The surgeon uses a tiny camera and other assistive tools to remove the thyroid gland.  Tell a health care provider about:  Any allergies you have.  All medicines you are taking, including vitamins, herbs,  eye drops, creams, and over-the-counter medicines.  Any problems you or family members have had with anesthetic medicines.  Any blood disorders you have.  Any surgeries you have had.  Any medical conditions you have. What are the risks? Generally, this is a safe procedure. However, problems can occur and include:  A decrease in parathyroid  hormone levels (hypoparathyroidism). Your parathyroid glands are located behind your thyroid gland, and they maintain the calcium level in your body. If these glands are damaged during surgery, your calcium level will drop. This will make your nerves irritable and cause muscle spasms.  An increase in thyroid hormone.  Damage to the nerves of your voice box (larynx).  Bleeding.  Infection at the site of the incision or incisions.  Temporary breathing difficulties. This is a very rare complication. It usually goes away within weeks.  What happens before the procedure?  Your health care provider will perform a physical exam and assess your voice for vocal changes.  Ask your health care provider about: ? Changing or stopping your regular medicines. This is especially important if you are taking diabetes medicines or blood thinners. ? Taking medicines such as aspirin and ibuprofen. These medicines can thin your blood. Do not take these medicines before your procedure if your health care provider instructs you not to.  Follow instructions from your health care provider about eating or drinking restrictions. What happens during the procedure? You will be given a medicine that makes you go to sleep (general anesthetic). Depending on which type of thyroidectomy you have, this is what may happen during the procedure: Conventional Thyroidectomy  The surgeon will make an incision in the center of your lower neck.  The muscles in your neck will be separated to reveal your thyroid gland.  Part or all of your thyroid gland will be removed.  You may need a tube (catheter) at the incision site to drain blood and fluids that accumulate under the skin after the procedure.The catheter may have to stay in place for a day or two after the procedure.  The incision will be closed with stitches (sutures). Endoscopic Thyroidectomy  The surgeon will make several small incisions in your neck, chest, or  armpit.  The surgeon will use a narrow tube with a light and camera at the end (endoscope). The surgeon will insert the endoscope into an incision.  Part or all of your thyroid gland will be removed.  You may need a catheter at the incision site to drain blood and fluids that accumulate under the skin after the procedure.The catheter may have to stay in place for a day or two after the procedure.  The incision will be closed with sutures. What happens after the procedure?  Your blood pressure, heart rate, breathing rate, and blood oxygen level will be monitored often until the medicines you were given have worn off.  Depending on the type of thyroidectomy you had, you may have: ? A swollen neck. ? Some mild neck pain. ? A slightly sore throat. ? A weak voice.  You will not be able to eat or drink until your health care provider says it is okay.  You may have a blood test to check the level of calcium in your body.  If you had a catheter put in during the procedure, it will usually be removed the next day. This information is not intended to replace advice given to you by your health care provider. Make sure you discuss any questions you  have with your health care provider. Document Released: 01/22/2001 Document Revised: 03/31/2016 Document Reviewed: 12/29/2013 Elsevier Interactive Patient Education  [employer].

## 2017-01-31 ENCOUNTER — Other Ambulatory Visit: Payer: Self-pay

## 2017-01-31 ENCOUNTER — Encounter (HOSPITAL_COMMUNITY): Payer: Self-pay

## 2017-01-31 ENCOUNTER — Ambulatory Visit (HOSPITAL_COMMUNITY)
Admission: RE | Admit: 2017-01-31 | Discharge: 2017-01-31 | Disposition: A | Discharge status: Home / Self Care | Payer: Medicare PPO | Source: Ambulatory Visit | Attending: General Surgery | Admitting: General Surgery

## 2017-01-31 ENCOUNTER — Encounter (HOSPITAL_COMMUNITY)
Admission: RE | Admit: 2017-01-31 | Discharge: 2017-01-31 | Disposition: A | Discharge status: Home / Self Care | Payer: Medicare PPO | Source: Ambulatory Visit | Attending: General Surgery | Admitting: General Surgery

## 2017-01-31 DIAGNOSIS — Z01810 Encounter for preprocedural cardiovascular examination: Secondary | ICD-10-CM | POA: Insufficient documentation

## 2017-01-31 DIAGNOSIS — E042 Nontoxic multinodular goiter: Secondary | ICD-10-CM | POA: Insufficient documentation

## 2017-01-31 DIAGNOSIS — I700 Atherosclerosis of aorta: Secondary | ICD-10-CM | POA: Diagnosis not present

## 2017-01-31 HISTORY — DX: Essential (primary) hypertension: I10

## 2017-01-31 LAB — CBC WITH DIFFERENTIAL/PLATELET
Basophils Absolute: 0 10*3/uL (ref 0.0–0.1)
Basophils Relative: 0 %
EOS ABS: 0.2 10*3/uL (ref 0.0–0.7)
EOS PCT: 2 %
HCT: 44.5 % (ref 36.0–46.0)
HEMOGLOBIN: 14.9 g/dL (ref 12.0–15.0)
LYMPHS ABS: 3.6 10*3/uL (ref 0.7–4.0)
Lymphocytes Relative: 39 %
MCH: 31.4 pg (ref 26.0–34.0)
MCHC: 33.5 g/dL (ref 30.0–36.0)
MCV: 93.9 fL (ref 78.0–100.0)
MONOS PCT: 7 %
Monocytes Absolute: 0.6 10*3/uL (ref 0.1–1.0)
Neutro Abs: 4.6 10*3/uL (ref 1.7–7.7)
Neutrophils Relative %: 52 %
PLATELETS: 355 10*3/uL (ref 150–400)
RBC: 4.74 MIL/uL (ref 3.87–5.11)
RDW: 12.6 % (ref 11.5–15.5)
WBC: 9.1 10*3/uL (ref 4.0–10.5)

## 2017-01-31 LAB — COMPREHENSIVE METABOLIC PANEL
ALK PHOS: 87 U/L (ref 38–126)
ALT: 16 U/L (ref 14–54)
ANION GAP: 11 (ref 5–15)
AST: 16 U/L (ref 15–41)
Albumin: 3.9 g/dL (ref 3.5–5.0)
BUN: 14 mg/dL (ref 6–20)
CALCIUM: 9.9 mg/dL (ref 8.9–10.3)
CO2: 22 mmol/L (ref 22–32)
Chloride: 106 mmol/L (ref 101–111)
Creatinine, Ser: 0.68 mg/dL (ref 0.44–1.00)
GFR calc non Af Amer: >60 mL/min (ref >60)
Glucose, Bld: 83 mg/dL (ref 65–99)
Potassium: 4 mmol/L (ref 3.5–5.1)
SODIUM: 139 mmol/L (ref 135–145)
TOTAL PROTEIN: 7.6 g/dL (ref 6.5–8.1)
Total Bilirubin: 0.5 mg/dL (ref 0.3–1.2)

## 2017-02-05 ENCOUNTER — Encounter (HOSPITAL_COMMUNITY)
Admission: RE | Disposition: A | Discharge status: Home / Self Care | Payer: Self-pay | Source: Ambulatory Visit | Attending: General Surgery

## 2017-02-05 ENCOUNTER — Observation Stay (HOSPITAL_COMMUNITY)
Admission: RE | Admit: 2017-02-05 | Discharge: 2017-02-06 | Disposition: A | Discharge status: Home / Self Care | Payer: Medicare PPO | Source: Ambulatory Visit | Attending: General Surgery | Admitting: General Surgery

## 2017-02-05 ENCOUNTER — Ambulatory Visit (HOSPITAL_COMMUNITY): Payer: Medicare PPO | Admitting: Anesthesiology

## 2017-02-05 ENCOUNTER — Encounter (HOSPITAL_COMMUNITY): Payer: Self-pay | Admitting: *Deleted

## 2017-02-05 DIAGNOSIS — Z7982 Long term (current) use of aspirin: Secondary | ICD-10-CM | POA: Insufficient documentation

## 2017-02-05 DIAGNOSIS — E7800 Pure hypercholesterolemia, unspecified: Secondary | ICD-10-CM | POA: Diagnosis not present

## 2017-02-05 DIAGNOSIS — Z7984 Long term (current) use of oral hypoglycemic drugs: Secondary | ICD-10-CM | POA: Diagnosis not present

## 2017-02-05 DIAGNOSIS — K219 Gastro-esophageal reflux disease without esophagitis: Secondary | ICD-10-CM | POA: Insufficient documentation

## 2017-02-05 DIAGNOSIS — E119 Type 2 diabetes mellitus without complications: Secondary | ICD-10-CM | POA: Diagnosis not present

## 2017-02-05 DIAGNOSIS — Z79899 Other long term (current) drug therapy: Secondary | ICD-10-CM | POA: Diagnosis not present

## 2017-02-05 DIAGNOSIS — I10 Essential (primary) hypertension: Secondary | ICD-10-CM | POA: Insufficient documentation

## 2017-02-05 DIAGNOSIS — C73 Malignant neoplasm of thyroid gland: Secondary | ICD-10-CM | POA: Diagnosis not present

## 2017-02-05 DIAGNOSIS — D34 Benign neoplasm of thyroid gland: Secondary | ICD-10-CM | POA: Diagnosis not present

## 2017-02-05 DIAGNOSIS — F419 Anxiety disorder, unspecified: Secondary | ICD-10-CM | POA: Insufficient documentation

## 2017-02-05 DIAGNOSIS — Z87891 Personal history of nicotine dependence: Secondary | ICD-10-CM | POA: Insufficient documentation

## 2017-02-05 DIAGNOSIS — Z9049 Acquired absence of other specified parts of digestive tract: Secondary | ICD-10-CM | POA: Insufficient documentation

## 2017-02-05 DIAGNOSIS — E042 Nontoxic multinodular goiter: Secondary | ICD-10-CM | POA: Diagnosis present

## 2017-02-05 DIAGNOSIS — M1990 Unspecified osteoarthritis, unspecified site: Secondary | ICD-10-CM | POA: Diagnosis not present

## 2017-02-05 DIAGNOSIS — R1310 Dysphagia, unspecified: Secondary | ICD-10-CM | POA: Insufficient documentation

## 2017-02-05 HISTORY — PX: THYROIDECTOMY: SHX17

## 2017-02-05 LAB — COMPREHENSIVE METABOLIC PANEL
ALBUMIN: 3.5 g/dL (ref 3.5–5.0)
ALT: 19 U/L (ref 14–54)
AST: 26 U/L (ref 15–41)
Alkaline Phosphatase: 76 U/L (ref 38–126)
Anion gap: 10 (ref 5–15)
BUN: 13 mg/dL (ref 6–20)
CHLORIDE: 100 mmol/L — AB (ref 101–111)
CO2: 26 mmol/L (ref 22–32)
Calcium: 9.2 mg/dL (ref 8.9–10.3)
Creatinine, Ser: 1.07 mg/dL — ABNORMAL HIGH (ref 0.44–1.00)
GFR calc Af Amer: 59 mL/min — ABNORMAL LOW (ref >60)
GFR calc non Af Amer: 51 mL/min — ABNORMAL LOW (ref >60)
GLUCOSE: 175 mg/dL — AB (ref 65–99)
POTASSIUM: 4.1 mmol/L (ref 3.5–5.1)
SODIUM: 136 mmol/L (ref 135–145)
Total Bilirubin: 0.6 mg/dL (ref 0.3–1.2)
Total Protein: 7 g/dL (ref 6.5–8.1)

## 2017-02-05 LAB — GLUCOSE, CAPILLARY
Glucose-Capillary: 149 mg/dL — ABNORMAL HIGH (ref 65–99)
Glucose-Capillary: 117 mg/dL — ABNORMAL HIGH (ref 65–99)

## 2017-02-05 SURGERY — THYROIDECTOMY
Anesthesia: General

## 2017-02-05 MED ORDER — DIPHENHYDRAMINE HCL 50 MG/ML IJ SOLN
12.5000 mg | Freq: Once | INTRAMUSCULAR | Status: AC
  Administered 2017-02-05: 12.5 mg via INTRAVENOUS

## 2017-02-05 MED ORDER — FENTANYL CITRATE (PF) 100 MCG/2ML IJ SOLN
INTRAMUSCULAR | Status: AC
  Filled 2017-02-05: qty 2

## 2017-02-05 MED ORDER — MORPHINE SULFATE (PF) 2 MG/ML IV SOLN: 2.0000 mg | INTRAVENOUS | Status: DC | PRN

## 2017-02-05 MED ORDER — PROPOFOL 10 MG/ML IV BOLUS
INTRAVENOUS | Status: AC
  Filled 2017-02-05: qty 20

## 2017-02-05 MED ORDER — CHLORHEXIDINE GLUCONATE CLOTH 2 % EX PADS
6.0000 | MEDICATED_PAD | Freq: Once | CUTANEOUS | Status: DC
  Administered 2017-02-05: 6 via TOPICAL

## 2017-02-05 MED ORDER — HYDROCODONE-ACETAMINOPHEN 7.5-325 MG PO TABS
1.0000 | ORAL_TABLET | Freq: Once | ORAL | Status: DC | PRN
  Filled 2017-02-05: qty 1

## 2017-02-05 MED ORDER — LIDOCAINE HCL (PF) 1 % IJ SOLN
INTRAMUSCULAR | Status: AC
  Filled 2017-02-05: qty 5

## 2017-02-05 MED ORDER — ACETAMINOPHEN 325 MG PO TABS: 650.0000 mg | ORAL_TABLET | Freq: Four times a day (QID) | ORAL | Status: DC | PRN

## 2017-02-05 MED ORDER — SUCCINYLCHOLINE CHLORIDE 20 MG/ML IJ SOLN
INTRAMUSCULAR | Status: DC | PRN
  Administered 2017-02-05: 160 mg via INTRAVENOUS

## 2017-02-05 MED ORDER — PROPOFOL 10 MG/ML IV BOLUS
INTRAVENOUS | Status: DC | PRN
  Administered 2017-02-05: 150 mg via INTRAVENOUS

## 2017-02-05 MED ORDER — DIPHENHYDRAMINE HCL 12.5 MG/5ML PO ELIX: 12.5000 mg | ORAL_SOLUTION | Freq: Four times a day (QID) | ORAL | Status: DC | PRN

## 2017-02-05 MED ORDER — SODIUM CHLORIDE 0.9 % IR SOLN
Status: DC | PRN
  Administered 2017-02-05: 1000 mL

## 2017-02-05 MED ORDER — METFORMIN HCL 500 MG PO TABS
500.0000 mg | ORAL_TABLET | Freq: Every day | ORAL | Status: DC
  Administered 2017-02-06: 500 mg via ORAL
  Filled 2017-02-05: qty 1

## 2017-02-05 MED ORDER — PROPOFOL 10 MG/ML IV BOLUS
INTRAVENOUS | Status: AC
  Filled 2017-02-05: qty 40

## 2017-02-05 MED ORDER — FENTANYL CITRATE (PF) 100 MCG/2ML IJ SOLN
25.0000 ug | INTRAMUSCULAR | Status: DC | PRN
  Administered 2017-02-05 (×2): 50 ug via INTRAVENOUS
  Filled 2017-02-05: qty 2

## 2017-02-05 MED ORDER — ROCURONIUM BROMIDE 50 MG/5ML IV SOLN
INTRAVENOUS | Status: AC
  Filled 2017-02-05: qty 1

## 2017-02-05 MED ORDER — FENTANYL CITRATE (PF) 100 MCG/2ML IJ SOLN
INTRAMUSCULAR | Status: DC | PRN
  Administered 2017-02-05 (×7): 50 ug via INTRAVENOUS

## 2017-02-05 MED ORDER — SUGAMMADEX SODIUM 500 MG/5ML IV SOLN
INTRAVENOUS | Status: AC
  Filled 2017-02-05: qty 5

## 2017-02-05 MED ORDER — HYDROCODONE-ACETAMINOPHEN 5-325 MG PO TABS
1.0000 | ORAL_TABLET | ORAL | Status: DC | PRN
  Administered 2017-02-05 – 2017-02-06 (×3): 1 via ORAL
  Filled 2017-02-05 (×3): qty 1

## 2017-02-05 MED ORDER — ONDANSETRON HCL 4 MG/2ML IJ SOLN
INTRAMUSCULAR | Status: AC
  Filled 2017-02-05: qty 2

## 2017-02-05 MED ORDER — LIDOCAINE HCL 1 % IJ SOLN
INTRAMUSCULAR | Status: DC | PRN
  Administered 2017-02-05: 30 mg via INTRADERMAL

## 2017-02-05 MED ORDER — MIDAZOLAM HCL 2 MG/2ML IJ SOLN
INTRAMUSCULAR | Status: AC
  Filled 2017-02-05: qty 2

## 2017-02-05 MED ORDER — DEXAMETHASONE SODIUM PHOSPHATE 4 MG/ML IJ SOLN
INTRAMUSCULAR | Status: AC
  Filled 2017-02-05: qty 1

## 2017-02-05 MED ORDER — LACTATED RINGERS IV SOLN
INTRAVENOUS | Status: DC
  Administered 2017-02-05: 20:00:00 via INTRAVENOUS

## 2017-02-05 MED ORDER — MIDAZOLAM HCL 2 MG/2ML IJ SOLN
1.0000 mg | Freq: Once | INTRAMUSCULAR | Status: AC | PRN
  Administered 2017-02-05: 2 mg via INTRAVENOUS

## 2017-02-05 MED ORDER — PANTOPRAZOLE SODIUM 40 MG PO TBEC
40.0000 mg | DELAYED_RELEASE_TABLET | Freq: Every day | ORAL | Status: DC
  Administered 2017-02-05 – 2017-02-06 (×2): 40 mg via ORAL
  Filled 2017-02-05 (×2): qty 1

## 2017-02-05 MED ORDER — SODIUM CHLORIDE 0.9 % IJ SOLN
INTRAMUSCULAR | Status: AC
  Filled 2017-02-05: qty 10

## 2017-02-05 MED ORDER — KETOROLAC TROMETHAMINE 30 MG/ML IJ SOLN
INTRAMUSCULAR | Status: AC
  Filled 2017-02-05: qty 1

## 2017-02-05 MED ORDER — LEVOTHYROXINE SODIUM 100 MCG PO TABS
100.0000 ug | ORAL_TABLET | Freq: Every day | ORAL | Status: DC
  Administered 2017-02-06: 100 ug via ORAL
  Filled 2017-02-05: qty 1

## 2017-02-05 MED ORDER — GLYCOPYRROLATE 0.2 MG/ML IJ SOLN
INTRAMUSCULAR | Status: AC
  Filled 2017-02-05: qty 2

## 2017-02-05 MED ORDER — GLYCOPYRROLATE 0.2 MG/ML IJ SOLN
INTRAMUSCULAR | Status: AC
  Filled 2017-02-05: qty 1

## 2017-02-05 MED ORDER — METOPROLOL TARTRATE 5 MG/5ML IV SOLN
INTRAVENOUS | Status: AC
  Filled 2017-02-05: qty 5

## 2017-02-05 MED ORDER — DIPHENHYDRAMINE HCL 50 MG/ML IJ SOLN: 12.5000 mg | Freq: Four times a day (QID) | INTRAMUSCULAR | Status: DC | PRN

## 2017-02-05 MED ORDER — EPHEDRINE SULFATE 50 MG/ML IJ SOLN
INTRAMUSCULAR | Status: AC
  Filled 2017-02-05: qty 1

## 2017-02-05 MED ORDER — METOPROLOL TARTRATE 5 MG/5ML IV SOLN
INTRAVENOUS | Status: DC | PRN
  Administered 2017-02-05 (×5): 1 mg via INTRAVENOUS

## 2017-02-05 MED ORDER — HEMOSTATIC AGENTS (NO CHARGE) OPTIME
TOPICAL | Status: DC | PRN
  Administered 2017-02-05: 1 via TOPICAL

## 2017-02-05 MED ORDER — MENTHOL 3 MG MT LOZG
1.0000 | LOZENGE | OROMUCOSAL | Status: DC | PRN
  Filled 2017-02-05 (×2): qty 9

## 2017-02-05 MED ORDER — MIDAZOLAM HCL 5 MG/5ML IJ SOLN
INTRAMUSCULAR | Status: DC | PRN
  Administered 2017-02-05: 2 mg via INTRAVENOUS

## 2017-02-05 MED ORDER — GLYCOPYRROLATE 0.2 MG/ML IJ SOLN
0.1000 mg | Freq: Once | INTRAMUSCULAR | Status: AC
  Administered 2017-02-05: 0.1 mg via INTRAVENOUS

## 2017-02-05 MED ORDER — NEOSTIGMINE METHYLSULFATE 10 MG/10ML IV SOLN
INTRAVENOUS | Status: DC | PRN
  Administered 2017-02-05: 2 mg via INTRAVENOUS
  Administered 2017-02-05: 1 mg via INTRAVENOUS

## 2017-02-05 MED ORDER — SUCCINYLCHOLINE CHLORIDE 20 MG/ML IJ SOLN
INTRAMUSCULAR | Status: AC
  Filled 2017-02-05: qty 1

## 2017-02-05 MED ORDER — KETOROLAC TROMETHAMINE 30 MG/ML IJ SOLN
30.0000 mg | Freq: Once | INTRAMUSCULAR | Status: AC
  Administered 2017-02-05: 30 mg via INTRAVENOUS

## 2017-02-05 MED ORDER — LOSARTAN POTASSIUM 50 MG PO TABS
50.0000 mg | ORAL_TABLET | Freq: Every day | ORAL | Status: DC
  Administered 2017-02-05 – 2017-02-06 (×2): 50 mg via ORAL
  Filled 2017-02-05 (×2): qty 1

## 2017-02-05 MED ORDER — ACETAMINOPHEN 650 MG RE SUPP: 650.0000 mg | Freq: Four times a day (QID) | RECTAL | Status: DC | PRN

## 2017-02-05 MED ORDER — BUPIVACAINE HCL (PF) 0.5 % IJ SOLN
INTRAMUSCULAR | Status: AC
  Filled 2017-02-05: qty 30

## 2017-02-05 MED ORDER — BUPIVACAINE HCL (PF) 0.5 % IJ SOLN
INTRAMUSCULAR | Status: DC | PRN
  Administered 2017-02-05: 10 mL

## 2017-02-05 MED ORDER — ROCURONIUM BROMIDE 100 MG/10ML IV SOLN
INTRAVENOUS | Status: DC | PRN
  Administered 2017-02-05: 30 mg via INTRAVENOUS
  Administered 2017-02-05: 5 mg via INTRAVENOUS

## 2017-02-05 MED ORDER — LACTATED RINGERS IV SOLN
INTRAVENOUS | Status: DC
  Administered 2017-02-05 (×2): via INTRAVENOUS

## 2017-02-05 MED ORDER — NEOSTIGMINE METHYLSULFATE 10 MG/10ML IV SOLN
INTRAVENOUS | Status: AC
  Filled 2017-02-05: qty 1

## 2017-02-05 MED ORDER — FENTANYL CITRATE (PF) 250 MCG/5ML IJ SOLN
INTRAMUSCULAR | Status: AC
  Filled 2017-02-05: qty 5

## 2017-02-05 MED ORDER — GLYCOPYRROLATE 0.2 MG/ML IJ SOLN
INTRAMUSCULAR | Status: DC | PRN
  Administered 2017-02-05: 0.6 mg via INTRAVENOUS

## 2017-02-05 MED ORDER — CALCIUM CARBONATE-VITAMIN D 500-200 MG-UNIT PO TABS
1.0000 | ORAL_TABLET | Freq: Two times a day (BID) | ORAL | Status: DC
  Administered 2017-02-05 – 2017-02-06 (×3): 1 via ORAL
  Filled 2017-02-05 (×3): qty 1

## 2017-02-05 MED ORDER — ONDANSETRON 4 MG PO TBDP: 4.0000 mg | ORAL_TABLET | Freq: Four times a day (QID) | ORAL | Status: DC | PRN

## 2017-02-05 MED ORDER — ONDANSETRON HCL 4 MG/2ML IJ SOLN: 4.0000 mg | Freq: Four times a day (QID) | INTRAMUSCULAR | Status: DC | PRN

## 2017-02-05 MED ORDER — SEVOFLURANE IN SOLN
RESPIRATORY_TRACT | Status: AC
  Filled 2017-02-05: qty 250

## 2017-02-05 MED ORDER — DIPHENHYDRAMINE HCL 50 MG/ML IJ SOLN
INTRAMUSCULAR | Status: AC
  Filled 2017-02-05: qty 1

## 2017-02-05 MED ORDER — SIMETHICONE 80 MG PO CHEW: 40.0000 mg | CHEWABLE_TABLET | Freq: Four times a day (QID) | ORAL | Status: DC | PRN

## 2017-02-05 SURGICAL SUPPLY — 63 items
ADH SKN CLS APL DERMABOND .7 (GAUZE/BANDAGES/DRESSINGS) ×1
APPLIER CLIP 11 MED OPEN (CLIP) ×3
APPLIER CLIP 9.375 SM OPEN (CLIP) ×3
APR CLP MED 11 20 MLT OPN (CLIP) ×1
APR CLP SM 9.3 20 MLT OPN (CLIP) ×1
ATTRACTOMAT 16X20 MAGNETIC DRP (DRAPES) ×3 IMPLANT
BAG HAMPER (MISCELLANEOUS) ×3 IMPLANT
BLADE SURG 15 STRL LF DISP TIS (BLADE) ×1 IMPLANT
BLADE SURG 15 STRL SS (BLADE) ×3
BLADE SURG SZ10 CARB STEEL (BLADE) ×3 IMPLANT
CHLORAPREP W/TINT 10.5 ML (MISCELLANEOUS) ×3 IMPLANT
CLIP APPLIE 11 MED OPEN (CLIP) ×1 IMPLANT
CLIP APPLIE 9.375 SM OPEN (CLIP) ×1 IMPLANT
CLOTH BEACON ORANGE TIMEOUT ST (SAFETY) ×3 IMPLANT
COVER LIGHT HANDLE STERIS (MISCELLANEOUS) ×6 IMPLANT
DECANTER SPIKE VIAL GLASS SM (MISCELLANEOUS) ×3 IMPLANT
DERMABOND ADVANCED (GAUZE/BANDAGES/DRESSINGS) ×2
DERMABOND ADVANCED .7 DNX12 (GAUZE/BANDAGES/DRESSINGS) ×1 IMPLANT
DRAPE LAPAROTOMY 77X122 PED (DRAPES) ×3 IMPLANT
DRAPE PROXIMA HALF (DRAPES) ×3 IMPLANT
ELECT NEEDLE TIP 2.8 STRL (NEEDLE) ×3 IMPLANT
ELECT REM PT RETURN 9FT ADLT (ELECTROSURGICAL) ×3
ELECTRODE REM PT RTRN 9FT ADLT (ELECTROSURGICAL) ×1 IMPLANT
FORMALIN 10 PREFIL 120ML (MISCELLANEOUS) ×3 IMPLANT
GAUZE SPONGE 4X4 16PLY XRAY LF (GAUZE/BANDAGES/DRESSINGS) ×6 IMPLANT
GLOVE BIOGEL PI IND STRL 7.0 (GLOVE) ×4 IMPLANT
GLOVE BIOGEL PI INDICATOR 7.0 (GLOVE) ×8
GLOVE SURG SS PI 7.5 STRL IVOR (GLOVE) ×9 IMPLANT
GOWN STRL REUS W/ TWL LRG LVL3 (GOWN DISPOSABLE) ×2 IMPLANT
GOWN STRL REUS W/TWL LRG LVL3 (GOWN DISPOSABLE) ×12 IMPLANT
HEMOSTAT ARISTA ABSORB 1G (MISCELLANEOUS) ×3 IMPLANT
HEMOSTAT SURGICEL 4X8 (HEMOSTASIS) ×3 IMPLANT
KIT BLADEGUARD II DBL (SET/KITS/TRAYS/PACK) ×3 IMPLANT
KIT ROOM TURNOVER APOR (KITS) ×3 IMPLANT
MANIFOLD NEPTUNE II (INSTRUMENTS) ×3 IMPLANT
MARKER SKIN DUAL TIP RULER LAB (MISCELLANEOUS) ×3 IMPLANT
NEEDLE HYPO 25X1 1.5 SAFETY (NEEDLE) ×3 IMPLANT
NS IRRIG 1000ML POUR BTL (IV SOLUTION) ×3 IMPLANT
PACK BASIC III (CUSTOM PROCEDURE TRAY) ×3
PACK SRG BSC III STRL LF ECLPS (CUSTOM PROCEDURE TRAY) ×1 IMPLANT
PAD ARMBOARD 7.5X6 YLW CONV (MISCELLANEOUS) ×3 IMPLANT
PENCIL HANDSWITCHING (ELECTRODE) ×3 IMPLANT
SET BASIN LINEN APH (SET/KITS/TRAYS/PACK) ×3 IMPLANT
SHEARS HARMONIC 9CM CVD (BLADE) ×3 IMPLANT
SPONGE DRAIN TRACH 4X4 STRL 2S (GAUZE/BANDAGES/DRESSINGS) IMPLANT
SPONGE GAUZE 2X2 8PLY STER LF (GAUZE/BANDAGES/DRESSINGS) ×1
SPONGE GAUZE 2X2 8PLY STRL LF (GAUZE/BANDAGES/DRESSINGS) ×2 IMPLANT
SPONGE INTESTINAL PEANUT (DISPOSABLE) ×30 IMPLANT
STAPLER VISISTAT 35W (STAPLE) ×3 IMPLANT
SUT ETHILON 4 0 PS 2 18 (SUTURE) IMPLANT
SUT SILK 2 0 (SUTURE) ×3
SUT SILK 2-0 18XBRD TIE 12 (SUTURE) ×1 IMPLANT
SUT SILK 3 0 (SUTURE)
SUT SILK 3-0 18XBRD TIE 12 (SUTURE) IMPLANT
SUT VIC AB 2-0 CT2 27 (SUTURE) ×3 IMPLANT
SUT VIC AB 3-0 SH 27 (SUTURE) ×3
SUT VIC AB 3-0 SH 27X BRD (SUTURE) ×1 IMPLANT
SUT VIC AB 4-0 PS2 27 (SUTURE) ×3 IMPLANT
SUT VICRYL AB 3 0 TIES (SUTURE) ×3 IMPLANT
SYR CONTROL 10ML LL (SYRINGE) ×3 IMPLANT
SYSTEM CHEST DRAIN TLS 7FR (DRAIN) ×3 IMPLANT
TOWEL OR 17X26 4PK STRL BLUE (TOWEL DISPOSABLE) ×3 IMPLANT
YANKAUER SUCT BULB TIP 10FT TU (MISCELLANEOUS) ×3 IMPLANT

## 2017-02-05 NOTE — Interval H&P Note (Signed)
History and Physical Interval Note:  02/05/2017 7:59 AM  Kristina Huang  has presented today for surgery, with the diagnosis of multinodular goiter  The various methods of treatment have been discussed with the patient and family. After consideration of risks, benefits and other options for treatment, the patient has consented to  Procedure(s): THYROIDECTOMY (N/A) as a surgical intervention .  The patient's history has been reviewed, patient examined, no change in status, stable for surgery.  I have reviewed the patient's chart and labs.  Questions were answered to the patient's satisfaction.     Aviva Signs

## 2017-02-05 NOTE — Op Note (Signed)
Patient:  Kristina Huang  DOB:  June 10, 1946  MRN:  [id number]   Preop Diagnosis:  Multinodular goiter  Postop Diagnosis:  Same  Procedure:  Total thyroidectomy  Surgeon:  Aviva Signs, M.D.  Anes:  Gen. endotracheal  Indications:  Patient is a 71 year old black female who presents with a multinodular goiter. She has developed dysphagia and now presents for total thyroidectomy. The risks and benefits of the procedure including bleeding, infection, nerve injury, and the possibility of voice changes were fully explained to the patient, who gave informed consent.  Procedure note:  The patient was placed the supine position. After induction of general endotracheal anesthesia, the neck was prepped and draped using usual sterile technique with DuraPrep. Surgical site confirmation was performed.  A transverse incision was made just above the jugular notch. The platysma was divided without difficulty. This was taken down to the omohyoid muscle. This was divided longitudinally. The left lobe of the thyroid gland was first exposed. It was significantly enlarged. The inferior thyroidal artery and vein, middle thyroidal artery and vein, and the suspensory ligament of Gwenlyn Found were all identified, ligated with clips, and divided using the Harmonic scalpel. Care was taken to avoid the recurrent laryngeal nerve. The gland was then rolled medially over to the trachea. It was removed from the trachea using the Harmonic scalpel without difficulty. It was sent to pathology for examination. The right lobe of the thyroid gland was enlarged but less so than the left. There was also dissected in a similar manner to the left lobe. It was sent to pathology for examination. All parathyroid glands were identified. Arista and Surgicel were placed in the thyroid beds. A THC drain was placed into the beds and brought through separate stab wound to the right of the incision. It was secured at the skin level using a 3-0 nylon  interrupted suture. The strap muscle was reapproximated longitudinally using a 2-0 Vicryl running suture. The platysma was reapproximated using a 3-0 Vicryl running suture. 0.5% Sensorcaine was instilled into the surrounding wound. The skin was closed using a 4-0 Vicryl subcuticular suture. Dermabond was applied.  All tape and needle counts were correct at the end the procedure. Patient was extubated in the operating room and transferred to PACU in stable condition. Patient was able to phonate the letter E without difficulty.  Complications:  None  EBL:  50 mL  Specimen:  Left lobe of thyroid gland, right lobe of thyroid gland  Drains: Drain to thyroid beds

## 2017-02-05 NOTE — Anesthesia Preprocedure Evaluation (Signed)
Anesthesia Evaluation   Patient awake    Airway Mallampati: I  TM Distance: >3 FB Neck ROM: Full   Comment: Thyroid nodule palpable on left, not obvious tracheal deviation, patient has no trouble swallowing or breathing Dental  (+) Teeth Intact   Pulmonary former smoker,    Pulmonary exam normal        Cardiovascular hypertension, Pt. on medications Normal cardiovascular exam Rhythm:Regular Rate:Normal     Neuro/Psych Anxiety    GI/Hepatic GERD  ,  Endo/Other  diabetes, Well Controlled, Type 2Goiter  Renal/GU      Musculoskeletal  (+) Arthritis ,   Abdominal Normal abdominal exam  (+)   Peds  Hematology   Anesthesia Other Findings   Reproductive/Obstetrics                            Anesthesia Physical Anesthesia Plan  ASA: III  Anesthesia Plan: General   Post-op Pain Management:    Induction:   PONV Risk Score and Plan:   Airway Management Planned: Oral ETT  Additional Equipment:   Intra-op Plan:   Post-operative Plan: Extubation in OR  Informed Consent: I have reviewed the patients History and Physical, chart, labs and discussed the procedure including the risks, benefits and alternatives for the proposed anesthesia with the patient or authorized representative who has indicated his/her understanding and acceptance.     Plan Discussed with: CRNA  Anesthesia Plan Comments:         Anesthesia Quick Evaluation

## 2017-02-05 NOTE — Anesthesia Procedure Notes (Signed)
Procedure Name: Intubation Date/Time: 02/05/2017 8:53 AM Performed by: Charmaine Downs Pre-anesthesia Checklist: Patient identified, Emergency Drugs available, Suction available and Patient being monitored Patient Re-evaluated:Patient Re-evaluated prior to inductionOxygen Delivery Method: Circle system utilized Preoxygenation: Pre-oxygenation with 100% oxygen Intubation Type: IV induction, Cricoid Pressure applied and Rapid sequence Ventilation: Mask ventilation without difficulty Grade View: Grade I Tube type: Oral Tube size: 7.0 mm Number of attempts: 1 Airway Equipment and Method: Stylet Placement Confirmation: ETT inserted through vocal cords under direct vision,  positive ETCO2 and breath sounds checked- equal and bilateral Secured at: 22 cm Tube secured with: Tape Dental Injury: Teeth and Oropharynx as per pre-operative assessment  Difficulty Due To: Difficulty was anticipated Comments: Difficulty expected due to Right deviation due to Thyroid mass.Easy,clear visualization.

## 2017-02-05 NOTE — Anesthesia Procedure Notes (Signed)
Epidural

## 2017-02-05 NOTE — Transfer of Care (Signed)
Immediate Anesthesia Transfer of Care Note  Patient: Kristina Huang  Procedure(s) Performed: Procedure(s): THYROIDECTOMY (N/A)  Patient Location: PACU  Anesthesia Type:General  Level of Consciousness: awake and patient cooperative  Airway & Oxygen Therapy: Patient Spontanous Breathing and Patient connected to tracheostomy mask oxygen  Post-op Assessment: Report given to RN and Post -op Vital signs reviewed and stable  Post vital signs: Reviewed and stable  Last Vitals:  Vitals:   02/05/17 0835 02/05/17 0840  BP: (!) 134/91 135/90  Pulse:    Resp: 16 (!) 31  Temp:      Last Pain:  Vitals:   02/05/17 0727  TempSrc: (P) Oral      Patients Stated Pain Goal: (P) 7 (61/51/83 4373)  Complications: No apparent anesthesia complications

## 2017-02-05 NOTE — Anesthesia Postprocedure Evaluation (Signed)
Anesthesia Post Note  Patient: Kristina Huang  Procedure(s) Performed: Procedure(s) (LRB): THYROIDECTOMY (N/A)  Patient location during evaluation: PACU Anesthesia Type: General Level of consciousness: awake and patient cooperative Pain management: pain level controlled Vital Signs Assessment: post-procedure vital signs reviewed and stable Respiratory status: spontaneous breathing, nonlabored ventilation and respiratory function stable Cardiovascular status: blood pressure returned to baseline Postop Assessment: no signs of nausea or vomiting Anesthetic complications: no     Last Vitals:  Vitals:   02/05/17 1106 02/05/17 1115  BP: (!) 155/91 (!) 154/86  Pulse: 100 88  Resp: 10 (!) 9  Temp: 36.5 C     Last Pain:  Vitals:   02/05/17 0727  TempSrc: (P) Oral                 Winslow Ederer J

## 2017-02-06 ENCOUNTER — Encounter (HOSPITAL_COMMUNITY): Payer: Self-pay

## 2017-02-06 DIAGNOSIS — C73 Malignant neoplasm of thyroid gland: Secondary | ICD-10-CM | POA: Diagnosis not present

## 2017-02-06 LAB — CBC
HCT: 40.5 % (ref 36.0–46.0)
Hemoglobin: 13.2 g/dL (ref 12.0–15.0)
MCH: 30.9 pg (ref 26.0–34.0)
MCHC: 32.6 g/dL (ref 30.0–36.0)
MCV: 94.8 fL (ref 78.0–100.0)
PLATELETS: 284 10*3/uL (ref 150–400)
RBC: 4.27 MIL/uL (ref 3.87–5.11)
RDW: 12.7 % (ref 11.5–15.5)
WBC: 8.6 10*3/uL (ref 4.0–10.5)

## 2017-02-06 LAB — COMPREHENSIVE METABOLIC PANEL
ALK PHOS: 68 U/L (ref 38–126)
ALT: 13 U/L — AB (ref 14–54)
AST: 18 U/L (ref 15–41)
Albumin: 3.2 g/dL — ABNORMAL LOW (ref 3.5–5.0)
Anion gap: 7 (ref 5–15)
BUN: 10 mg/dL (ref 6–20)
CALCIUM: 9.2 mg/dL (ref 8.9–10.3)
CHLORIDE: 103 mmol/L (ref 101–111)
CO2: 26 mmol/L (ref 22–32)
CREATININE: 0.76 mg/dL (ref 0.44–1.00)
Glucose, Bld: 153 mg/dL — ABNORMAL HIGH (ref 65–99)
Potassium: 3.9 mmol/L (ref 3.5–5.1)
Sodium: 136 mmol/L (ref 135–145)
Total Bilirubin: 0.8 mg/dL (ref 0.3–1.2)
Total Protein: 6.6 g/dL (ref 6.5–8.1)

## 2017-02-06 MED ORDER — CALCIUM CARBONATE-VITAMIN D 500-200 MG-UNIT PO TABS: 1.0000 | ORAL_TABLET | Freq: Two times a day (BID) | ORAL | 1 refills | Status: DC

## 2017-02-06 MED ORDER — HYDROCODONE-ACETAMINOPHEN 5-325 MG PO TABS: 1.0000 | ORAL_TABLET | ORAL | 0 refills | Status: DC | PRN

## 2017-02-06 MED ORDER — LEVOTHYROXINE SODIUM 100 MCG PO TABS: 100.0000 ug | ORAL_TABLET | Freq: Every day | ORAL | 1 refills | Status: DC

## 2017-02-06 NOTE — Progress Notes (Signed)
Pt IV removed in preparation for discharge. Tolerated well.

## 2017-02-06 NOTE — Discharge Summary (Signed)
Physician Discharge Summary  Patient ID: Kristina Huang MRN: [id number] DOB/AGE: 1946/08/12 71 y.o.  Admit date: 02/05/2017 Discharge date: 02/06/2017  Admission Diagnoses:Multinodular goiter  Discharge Diagnoses: Same Active Problems:   Multinodular goiter Hypertension, non-insulin-dependent diabetes mellitus  Discharged Condition: good  Hospital Course: Patient is a 71 year old black female who underwent a total thyroidectomy on 02/05/2017. She tolerated the procedure well. Her postoperative course has been unremarkable. No hypocalcemia has been noted. Her voice feels better. She is being discharged home on postoperative day 1 in good and improving condition.  Treatments: surgery: Total thyroidectomy on 02/05/2017  Discharge Exam: Blood pressure (!) 154/75, pulse (!) 106, temperature 98.4 F (36.9 C), temperature source Oral, resp. rate 18, height 5\' 3"  (1.6 m), weight 163 lb 2.3 oz (74 kg), SpO2 94 %. General appearance: alert, cooperative and no distress Neck: Incision healing well. Mild ecchymosis noted. Mild swelling noted, but no tense neck noted. Drain removed. Resp: clear to auscultation bilaterally Cardio: regular rate and rhythm, S1, S2 normal, no murmur, click, rub or gallop  Disposition: 01-Home or Self Care  Discharge Instructions    Diet - low sodium heart healthy    Complete by:  As directed    Increase activity slowly    Complete by:  As directed      Allergies as of 02/06/2017   No Known Allergies     Medication List    TAKE these medications   acetaminophen 650 MG CR tablet Commonly known as:  TYLENOL Take 1,300 mg by mouth daily as needed for pain.   aspirin EC 81 MG tablet Take 81 mg by mouth at bedtime.   calcium-vitamin D 500-200 MG-UNIT tablet Commonly known as:  OSCAL WITH D Take 1 tablet by mouth 2 (two) times daily.   HYDROcodone-acetaminophen 5-325 MG tablet Commonly known as:  NORCO/VICODIN Take 1-2 tablets by mouth every 4 (four)  hours as needed for moderate pain.   levothyroxine 100 MCG tablet Commonly known as:  SYNTHROID, LEVOTHROID Take 1 tablet (100 mcg total) by mouth daily before breakfast.   losartan 50 MG tablet Commonly known as:  COZAAR Take 1 tablet by mouth daily.   metFORMIN 500 MG tablet Commonly known as:  GLUCOPHAGE Take 500 mg by mouth daily with breakfast.   omeprazole 20 MG capsule Commonly known as:  PRILOSEC Take 20 mg by mouth daily.   Potassium 99 MG Tabs Take 1 tablet by mouth daily.      Follow-up Information    Aviva Signs, MD. Schedule an appointment as soon as possible for a visit on 02/18/2017.   Specialty:  General Surgery Contact information: [id number] [address] [phone]           Signed: Aviva Signs 02/06/2017, 7:32 AM

## 2017-02-06 NOTE — Discharge Instructions (Signed)
Thyroidectomy, Care After °Refer to this sheet in the next few weeks. These instructions provide you with information about caring for yourself after your procedure. Your health care provider may also give you more specific instructions. Your treatment has been planned according to current medical practices, but problems sometimes occur. Call your health care provider if you have any problems or questions after your procedure. °What can I expect after the procedure? °After your procedure, it is typical to have: °· Mild pain in the neck or upper body, especially when swallowing. °· A sore throat. °· A weak voice. ° °Follow these instructions at home: °· Take medicines only as directed by your health care provider. °· If your entire thyroid gland was removed, you may need to take thyroid hormone medicine from now on. °· Do not take medicines that contain aspirin and ibuprofen until your health care provider says that you can. These medicines can increase your risk of bleeding. °· Some pain medicines cause constipation. Drink enough fluid to keep your urine clear or pale yellow. This can help to prevent constipation. °· Start slowly with eating. You may need to have only liquids and soft foods for a few days or as directed by your health care provider. °· Do not take baths, swim, or use a hot tub until your health care provider approves. °· There are many different ways to close and cover an incision, including stitches (sutures), skin glue, and adhesive strips. Follow your health care provider's instructions for: °? Incision care. °? Bandage (dressing) changes and removal. °? Incision closure removal. °· Resume your usual activities as directed by your health care provider. °· For the first 10 days after the procedure or as instructed by your health care provider: °? Do not lift anything heavier than 20 lb (9.1 kg). °? Do not jog, swim, or do other strenuous exercises. °? Do not play contact sports. °· Keep all  follow-up visits as directed by your health care provider. This is important. °Contact a health care provider if: °· The soreness in your throat gets worse. °· You have increased pain at your incision or incisions. °· You have increased bleeding from an incision. °· Your incision becomes infected. Watch for: °? Swelling. °? Redness. °? Warmth. °? Pus. °· You notice a bad smell coming from an incision or dressing. °· You have a fever. °· You feel lightheaded or faint. °· You have numbness, tingling, or muscle spasms in your: °? Arms. °? Hands. °? Feet. °? Face. °· You have trouble swallowing. °Get help right away if: °· You develop a rash. °· You have difficulty breathing. °· You hear whistling noises coming from your chest. °· You develop a cough that gets worse. °· Your speech changes, or you have hoarseness that gets worse. °This information is not intended to replace advice given to you by your health care provider. Make sure you discuss any questions you have with your health care provider. °Document Released: 02/15/2005 Document Revised: 03/31/2016 Document Reviewed: 12/29/2013 °Elsevier Interactive Patient Education © 2018 [employer] ° °

## 2017-02-06 NOTE — Progress Notes (Signed)
Reviewed discharge instructions with pt and answered all questions at this time.   

## 2017-02-10 NOTE — Addendum Note (Signed)
Addendum  created 02/10/17 0839 by Vista Deck, CRNA   Anesthesia Intra Flowsheets edited

## 2017-02-25 ENCOUNTER — Ambulatory Visit (INDEPENDENT_AMBULATORY_CARE_PROVIDER_SITE_OTHER): Payer: Self-pay | Admitting: General Surgery

## 2017-02-25 ENCOUNTER — Encounter: Payer: Self-pay | Admitting: General Surgery

## 2017-02-25 VITALS — BP 160/96 | HR 106 | Temp 97.7°F | Resp 18 | Ht 63.0 in | Wt 161.0 lb

## 2017-02-25 DIAGNOSIS — Z09 Encounter for follow-up examination after completed treatment for conditions other than malignant neoplasm: Secondary | ICD-10-CM

## 2017-02-25 NOTE — Patient Instructions (Signed)
Thyroid Cancer °The thyroid is a butterfly-shaped gland in the neck. The thyroid makes hormones that control functions like heart rate, blood pressure, temperature, and weight. Thyroid cancer is an abnormal growth of cells in the thyroid. These cells grow and multiply rapidly and do not die as normal cells do. °There are four main types of thyroid cancer: °· Papillary cancer. This is the least harmful type of thyroid cancer. It typically affects women of child-bearing age. °· Follicular cancer. This type of cancer is the most likely to come back after treatment and spread to other parts of the body. °· Medullary cancer. Medullary cancer can be passed down through families. A person who inherits the gene for this cancer is at high risk for developing the cancer. °· Anaplastic cancer. This type of thyroid cancer spreads quickly to the windpipe (trachea) and causes breathing problems. This type of cancer is most common in people 65 years of age or older. ° °What are the causes? °The cause of thyroid cancer is not known. °What increases the risk? °Risk factors for thyroid cancer include: °· Radiation exposure or radiation treatments to your head and neck during infancy or childhood. °· Having an enlarged thyroid. °· Having a family history of thyroid disease or inheriting family conditions. °· Being female. °· Being Asian. ° °What are the signs or symptoms? °Symptoms of thyroid cancer may include: °· Larger-than-normal thyroid gland. °· Lumps or swelling in your neck. °· Hoarseness or a change in your voice. °· A cough. °· Coughing up blood. °· Difficulty swallowing. °· Shortness of breath. ° °How is this diagnosed? °Your health care provider will examine your thyroid during a physical exam. He or she may order tests such as: °· An imaging study using sound waves and a computer (ultrasound). °· Blood tests. °· A tissue sample test (biopsy). ° °Other tests may be ordered to see if your cancer has spread. These tests may  include CT, MRI, or PET scans. Your health care provider may see if you have any genetic changes that may put you at risk for other types of cancer. °How is this treated? °Most thyroid cancers are treated with a surgery to remove most or all of the thyroid gland (thyroidectomy). In some cases, treatment also involves removing the lymph nodes in the neck that are close to the thyroid. °After surgery, you may have: °· Thyroid hormone therapy. The therapy is done to: °? Replace the hormone in the body that is normally made by the thyroid. °? Suppress a thyroid-stimulating hormone (TSH) that activates the thyroid and makes any remaining cancer cells grow. °· Radioactive iodine treatment. This treatment is often used to destroy any remaining thyroid tissue and cancerous tissue that could not be seen and was not removed during the thyroidectomy. This treatment may also be used to treat thyroid cancer that has spread to other parts of the body or has recurred. °· External radiation. This is typically done to treat thyroid cancer that has spread to the bones. °· Chemotherapy treatment. This is medicine that kills cancer cells and prevents them from growing. °· Alcohol ablation. This kills cancer cells by injecting alcohol into the cells. °· Biologic therapy or immunotherapy. This uses the body’s own immune system to fight cancer cells. ° °Follow these instructions at home: °· Take medicines only as directed by your health care provider. °· Keep all follow-up visits as directed by your health care provider. This is important. °Contact a health care provider if: °· You   feel nauseous or you vomit. °· You have diarrhea. °· You have a rash. °· You have problems with urinating, such as: °? Having a burning sensation. °? Needing to urinate more often than usual. °? Pain or difficulty urinating. °? Having blood in your urine. °· You have a new cough. °· You have symptoms of too much thyroid hormone, such as: °? Nervousness or  anxiety. °? Unintentional weight loss. °? Sweating. °? Difficulty sleeping. °? Hair loss. °? Heart palpitations. °? Frequent bowel movements. °· You have symptoms of too little thyroid hormone, such as: °? Fatigue. °? Puffiness in your face, hands, or feet. °? Unintentional weight gain. °? Feeling cold. °? Constipation. °· You have a fever. °Get help right away if: °· You have chest pain. °· You have shortness of breath. °· You suddenly feel too weak or dizzy to stand or walk. °This information is not intended to replace advice given to you by your health care provider. Make sure you discuss any questions you have with your health care provider. °Document Released: 06/21/2004 Document Revised: 03/31/2016 Document Reviewed: 12/30/2013 °Elsevier Interactive Patient Education © 2017 [employer] ° °

## 2017-02-25 NOTE — Progress Notes (Signed)
Subjective:     Kristina Huang  Status post total thyroidectomy. Doing well. Dysphasia resolved. No voice changes. Objective:    BP (!) 160/96   Pulse (!) 106   Temp 97.7 F (36.5 C)   Resp 18   Ht 5\' 3"  (1.6 m)   Wt 161 lb (73 kg)   BMI 28.52 kg/m   General:  alert, cooperative and no distress  Neck incision healing well. 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     Assessment:    Doing well postoperatively.    Plan:   Patient told of final pathology. She was instructed to follow-up with Dr. Dorris Fetch for radioactive iodine treatment. Petra Kuba given about thyroid cancer. Follow-up here as needed.

## 2017-03-14 ENCOUNTER — Other Ambulatory Visit: Payer: Self-pay | Admitting: "Endocrinology

## 2017-03-15 LAB — TSH: TSH: 2.12 mIU/L

## 2017-03-15 LAB — T4, FREE: Free T4: 1.4 ng/dL (ref 0.8–1.8)

## 2017-03-18 ENCOUNTER — Ambulatory Visit (INDEPENDENT_AMBULATORY_CARE_PROVIDER_SITE_OTHER): Payer: Medicare PPO | Admitting: "Endocrinology

## 2017-03-18 ENCOUNTER — Telehealth: Payer: Self-pay | Admitting: *Deleted

## 2017-03-18 ENCOUNTER — Encounter: Payer: Self-pay | Admitting: "Endocrinology

## 2017-03-18 VITALS — BP 121/79 | HR 87 | Wt 162.0 lb

## 2017-03-18 DIAGNOSIS — C73 Malignant neoplasm of thyroid gland: Secondary | ICD-10-CM | POA: Insufficient documentation

## 2017-03-18 DIAGNOSIS — E890 Postprocedural hypothyroidism: Secondary | ICD-10-CM | POA: Diagnosis not present

## 2017-03-18 MED ORDER — LEVOTHYROXINE SODIUM 112 MCG PO TABS: 112.0000 ug | ORAL_TABLET | Freq: Every day | ORAL | 3 refills | Status: DC

## 2017-03-18 NOTE — Progress Notes (Signed)
Subjective:    Patient ID: Kristina Huang, female    DOB: 1946/03/01, PCP Sharee Pimple Lennon Alstrom, PA-C   Past Medical History:  Diagnosis Date  . Acid reflux   . Arthritis   . Diabetes mellitus without complication (HCC)    fasting 100-130  . Family history of adverse reaction to anesthesia   . Hypercholesterolemia   . Hypertension   . 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 BACK SURGERY     spinal fusion  . BUNIONECTOMY Bilateral   . CHOLECYSTECTOMY     Dr. Irving Shows  . COLONOSCOPY  2009   Dr. Oneida Alar: moderate internal hemorrhoids, frequent sigmoid colon diverticula, polyps X 3, one large adenoma otherwise hyperplastic  . COLONOSCOPY N/A 09/09/2014   Procedure: COLONOSCOPY;  Surgeon: Danie Binder, MD;  Location: [facility];  Service: Endoscopy;  Laterality: N/A;  200 - moved to 1/29 @ 12:45  . ESOPHAGOGASTRODUODENOSCOPY N/A 09/09/2014   Procedure: ESOPHAGOGASTRODUODENOSCOPY (EGD);  Surgeon: Danie Binder, MD;  Location: [facility];  Service: Endoscopy;  Laterality: N/A;  . THYROIDECTOMY N/A 02/05/2017   Procedure: THYROIDECTOMY;  Surgeon: Aviva Signs, MD;  Location: [facility];  Service: General;  Laterality: N/A;  . TUBAL LIGATION     Social History   Social History  . Marital status: Divorced    Spouse name: N/A  . Number of children: N/A  . Years of education: N/A   Social History Main Topics  . Smoking status: Former Smoker    Packs/day: 0.25    Years: 2.00    Types: Cigarettes    Quit date: 01/31/2010  . Smokeless tobacco: Never Used  . Alcohol use No  . Drug use: No  . Sexual activity: Not Currently    Birth control/ protection: Surgical   Other Topics Concern  . None   Social History Narrative  . None   Outpatient Encounter Prescriptions as of 03/18/2017  Medication Sig  . acetaminophen (TYLENOL) 650 MG CR tablet Take 1,300 mg by mouth daily as needed for pain.  Marland Kitchen aspirin  EC 81 MG tablet Take 81 mg by mouth at bedtime.   . calcium-vitamin D (OSCAL WITH D) 500-200 MG-UNIT tablet Take 1 tablet by mouth 2 (two) times daily.  Marland Kitchen HYDROcodone-acetaminophen (NORCO/VICODIN) 5-325 MG tablet Take 1-2 tablets by mouth every 4 (four) hours as needed for moderate pain.  Marland Kitchen levothyroxine (SYNTHROID, LEVOTHROID) 100 MCG tablet Take 1 tablet (100 mcg total) by mouth daily before breakfast.  . losartan (COZAAR) 50 MG tablet Take 1 tablet by mouth daily.  . metFORMIN (GLUCOPHAGE) 500 MG tablet Take 500 mg by mouth daily with breakfast.  . omeprazole (PRILOSEC) 20 MG capsule Take 20 mg by mouth daily.  . Potassium 99 MG TABS Take 1 tablet by mouth daily.   No facility-administered encounter medications on file as of 03/18/2017.    ALLERGIES: No Known Allergies  VACCINATION STATUS:  There is no immunization history on file for this patient.  HPI 71 year old female patient with medical history as above. She is being seen in f/u for multinodular goiter. - During her last visit, she was approached for total thyroidectomy given the sheer size of her thyroid and inconclusive FNA. 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 The left lobe was said to be consistent with hyperplastic nodule.  - She is recovering from her surgery very well. She was initiated on levothyroxine 100 g by mouth every morning.  - She was known to have multinodular goiter for at least 7 years. Series of thyroid/neck ultrasound results showed that the thyroid continued to grow over time as well as increasing nodule size in bilateral lobes. - she also had  Feelings of choking sensations on and off and shortness of breath intermittently. - She denies any family history of thyroid cancer, however one of her sisters have  hyperthyroidism status post treatment with what appears to be radioactive iodine therapy.  Review of Systems  Constitutional: + steady weight , + fatigue, no subjective hyperthermia, no subjective hypothermia Eyes: no blurry vision, no xerophthalmia ENT: no sore throat, + documented goiter and nodules seem that thyroid, + dysphagia/odynophagia, no hoarseness Cardiovascular: no Chest Pain, no Shortness of Breath, no palpitations, no leg swelling Respiratory: no cough, no SOB Gastrointestinal: no Nausea/Vomiting/Diarhhea Musculoskeletal: no muscle/joint aches Skin: no rashes Neurological: no tremors, no numbness, no tingling, no dizziness Psychiatric: no depression, no anxiety  Objective:    BP 121/79   Pulse 87   Wt 162 lb (73.5 kg)   SpO2 98%   BMI 28.70 kg/m   Wt Readings from Last 3 Encounters:  03/18/17 162 lb (73.5 kg)  02/25/17 161 lb (73 kg)  02/05/17 163 lb 2.3 oz (74 kg)    Physical Exam  Constitutional: Slightly over weight for hight, not in acute distress, normal state of mind Eyes: PERRLA, EOMI, no exophthalmos ENT: moist mucous membranes, + Healing post thyroidectomy scar on anterior lower neck , no cervical lymphadenopathy Cardiovascular: normal precordial activity, Regular Rate and Rhythm, no Murmur/Rubs/Gallops Respiratory:  adequate breathing efforts, no gross chest deformity, Clear to auscultation bilaterally Gastrointestinal: abdomen soft, Non -tender, No distension, Bowel Sounds present Musculoskeletal: no gross deformities, strength intact in all four extremities Skin: moist, warm, no rashes Neurological: no tremor with outstretched hands, Deep tendon reflexes normal in all four extremities.  CMP ( most recent) CMP     Component Value Date/Time   NA 136 02/06/2017 0444   K 3.9 02/06/2017 0444   CL 103 02/06/2017 0444   CO2 26 02/06/2017 0444   GLUCOSE 153 (H) 02/06/2017 0444   BUN 10 02/06/2017 0444   CREATININE 0.76 02/06/2017 0444   CALCIUM 9.2  02/06/2017 0444   PROT 6.6 02/06/2017 0444   ALBUMIN 3.2 (L) 02/06/2017 0444   AST 18 02/06/2017 0444   ALT 13 (L) 02/06/2017 0444   ALKPHOS 68 02/06/2017 0444   BILITOT 0.8 02/06/2017 0444   GFRNONAA >60 02/06/2017 0444   GFRAA >60 02/06/2017 0444   Results for ANISSA, ABBS (MRN [id number]) as of 03/18/2017 12:58  Ref. Range 03/14/2017 12:19  TSH Latest Units: Huang/L 2.12  T4,Free(Direct) Latest Ref Range: 0.8 - 1.8 ng/dL 1.4      Fine-needle aspiration cytology of the thyroid on 10/23/2016 showed: THYROID, FINE NEEDLE ASPIRATION, LEFT (SPECIMEN 1 OF 1, COLLECTED 10/23/16): CONSISTENT WITH BENIGN FOLLICULAR NODULE (BETHESDA CATEGORY II).  02/05/2017: She underwent total thyroidectomy: 02/05/2017: INAL DIAGNOSIS Diagnosis 1. Thyroid, lobectomy, left - NODULAR HYPERPLASIA (MULTINODULAR GOITER). 2. Thyroid, lobectomy, right - PAPILLARY THYROID CARCINOMA, TWO TUMORS, 0.5 AND 0.2 CM. - PAPILLARY CARCINOMA FOCALLY EXTENDS INTO THE PERITHYROIDAL CONNECTIVE TISSUE. - HURTHLE CELL ADENOMATOUS NODULE. - NODULAR HYPERPLASIA.  TNM code: pT1a , pNX.  Assessment & Plan:  1. 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 Her surgical sample unexpectedly revealed occult malignancy on the right lobe of the thyroid, 0.5cm  with focal extrathyroidal extension and 0.2 cm foci.  TNM code: pT1a , pNX.  - I discussed with her the various types of thyroid carcinoma's and had a long discussion on completion of therapy  with I-131 remnant ablation , and the need for long-term follow-up. - She is status post parathyroidectomy, however, she would need remnant ablation with I-131 after Thyrogen stimulation. - I will arrange for this therapy to be administered as soon as possible followed by post therapy or body scan. - Thyroglobulin level and thyroglobulin antibodies are also ordered  to be drawn on the day of or body scan.  2. Postsurgical hypothyroidism  Based on her thyroid function test on 03/14/2017,  she would benefit from slight increase in his dose of levothyroxine.  - The goal of therapy in thyroid cancer suppressive therapy is to keep TSH between 0.1-0.5 . - I will prescribe levothyroxine 112 g by mouth every morning.   - We discussed about correct intake of levothyroxine, at fasting, with water, separated by at least 30 minutes from breakfast, and separated by more than 4 hours from calcium, iron, multivitamins, acid reflux medications (PPIs). -Patient is made aware of the fact that thyroid hormone replacement is needed for life, dose to be adjusted by periodic monitoring of thyroid function tests.  40 minutes of care time was dedicated to this patient, 50% of which was dedicated on counseling, discussing completion therapy for thyroid cancer and long-term surveillance plans.  - I advised patient to maintain close follow up with Sharee Pimple Lennon Alstrom, PA-C for primary care needs. Follow up plan: Return in about 6 weeks (around 04/29/2017) for follow up with Whole Body Scan w/Thyrogen, follow up with pre-visit labs.  Glade Lloyd, MD Phone: [phone]  Fax: [phone]   03/18/2017, 12:57 PM

## 2017-03-18 NOTE — Telephone Encounter (Signed)
Nuclear Med Scheduled @ [facility] As Follows: 8/22 , 8/23 (Shots)  &  8/24 (Dosing) @ 8:00 AM  Whole Body Scan on  9/4 @ 8:00 AM ( takes  1 1/2 hr )

## 2017-04-02 ENCOUNTER — Encounter (HOSPITAL_COMMUNITY)
Admission: RE | Admit: 2017-04-02 | Discharge: 2017-04-02 | Disposition: A | Discharge status: Home / Self Care | Payer: Medicare PPO | Source: Ambulatory Visit | Attending: "Endocrinology | Admitting: "Endocrinology

## 2017-04-02 DIAGNOSIS — C73 Malignant neoplasm of thyroid gland: Secondary | ICD-10-CM | POA: Diagnosis not present

## 2017-04-02 MED ORDER — THYROTROPIN ALFA 1.1 MG IM SOLR
INTRAMUSCULAR | Status: AC
  Administered 2017-04-02: 0.9 mg via INTRAMUSCULAR
  Filled 2017-04-02: qty 0.9

## 2017-04-02 MED ORDER — STERILE WATER FOR INJECTION IJ SOLN
INTRAMUSCULAR | Status: AC
  Filled 2017-04-02: qty 10

## 2017-04-02 MED ORDER — THYROTROPIN ALFA 1.1 MG IM SOLR
0.9000 mg | INTRAMUSCULAR | Status: AC
Start: 2017-04-02 — End: 2017-04-02
  Administered 2017-04-02: 0.9 mg via INTRAMUSCULAR

## 2017-04-03 ENCOUNTER — Encounter (HOSPITAL_COMMUNITY)
Admission: RE | Admit: 2017-04-03 | Discharge: 2017-04-03 | Disposition: A | Discharge status: Home / Self Care | Payer: Medicare PPO | Source: Ambulatory Visit | Attending: "Endocrinology | Admitting: "Endocrinology

## 2017-04-03 DIAGNOSIS — C73 Malignant neoplasm of thyroid gland: Secondary | ICD-10-CM

## 2017-04-03 MED ORDER — STERILE WATER FOR INJECTION IJ SOLN
INTRAMUSCULAR | Status: AC
  Filled 2017-04-03: qty 10

## 2017-04-03 MED ORDER — THYROTROPIN ALFA 1.1 MG IM SOLR
0.9000 mg | INTRAMUSCULAR | Status: AC
  Administered 2017-04-03: 0.9 mg via INTRAMUSCULAR

## 2017-04-03 MED ORDER — THYROTROPIN ALFA 1.1 MG IM SOLR
INTRAMUSCULAR | Status: AC
  Filled 2017-04-03: qty 0.9

## 2017-04-04 ENCOUNTER — Encounter (HOSPITAL_COMMUNITY): Payer: Self-pay

## 2017-04-04 ENCOUNTER — Encounter (HOSPITAL_COMMUNITY)
Admission: RE | Admit: 2017-04-04 | Discharge: 2017-04-04 | Disposition: A | Discharge status: Home / Self Care | Payer: Medicare PPO | Source: Ambulatory Visit | Attending: "Endocrinology | Admitting: "Endocrinology

## 2017-04-04 DIAGNOSIS — C73 Malignant neoplasm of thyroid gland: Secondary | ICD-10-CM | POA: Diagnosis not present

## 2017-04-04 HISTORY — DX: Malignant (primary) neoplasm, unspecified: C80.1

## 2017-04-15 ENCOUNTER — Encounter (HOSPITAL_COMMUNITY)
Admission: RE | Admit: 2017-04-15 | Discharge: 2017-04-15 | Disposition: A | Discharge status: Home / Self Care | Payer: Medicare PPO | Source: Ambulatory Visit | Attending: "Endocrinology | Admitting: "Endocrinology

## 2017-04-15 DIAGNOSIS — C73 Malignant neoplasm of thyroid gland: Secondary | ICD-10-CM

## 2017-04-30 LAB — THYROGLOBULIN LEVEL: THYROGLOBULIN: 1.1 ng/mL — AB

## 2017-04-30 LAB — T4, FREE: Free T4: 1.4 ng/dL (ref 0.8–1.8)

## 2017-04-30 LAB — TSH: TSH: 2.96 mIU/L (ref 0.40–4.50)

## 2017-04-30 LAB — THYROGLOBULIN ANTIBODY

## 2017-05-06 ENCOUNTER — Other Ambulatory Visit: Payer: Self-pay

## 2017-05-06 ENCOUNTER — Encounter: Payer: Self-pay | Admitting: "Endocrinology

## 2017-05-06 ENCOUNTER — Ambulatory Visit (INDEPENDENT_AMBULATORY_CARE_PROVIDER_SITE_OTHER): Payer: Medicare PPO | Admitting: "Endocrinology

## 2017-05-06 VITALS — BP 145/92 | HR 99 | Ht 63.0 in | Wt 166.0 lb

## 2017-05-06 DIAGNOSIS — C73 Malignant neoplasm of thyroid gland: Secondary | ICD-10-CM

## 2017-05-06 DIAGNOSIS — E890 Postprocedural hypothyroidism: Secondary | ICD-10-CM

## 2017-05-06 MED ORDER — LEVOTHYROXINE SODIUM 125 MCG PO TABS: 125.0000 ug | ORAL_TABLET | Freq: Every day | ORAL | 6 refills | Status: DC

## 2017-05-06 NOTE — Progress Notes (Signed)
Subjective:    Patient ID: Kristina Huang, female    DOB: 03-01-1946, PCP Abran Richard, MD   Past Medical History:  Diagnosis Date  . Acid reflux   . Arthritis   . Cancer ([place])    Thyroid  . Diabetes mellitus without complication (HCC)    fasting 100-130  . Family history of adverse reaction to anesthesia   . Hypercholesterolemia   . Hypertension   . 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 BACK SURGERY     spinal fusion  . BUNIONECTOMY Bilateral   . CHOLECYSTECTOMY     Dr. Irving Shows  . COLONOSCOPY  2009   Dr. Oneida Alar: moderate internal hemorrhoids, frequent sigmoid colon diverticula, polyps X 3, one large adenoma otherwise hyperplastic  . COLONOSCOPY N/A 09/09/2014   Procedure: COLONOSCOPY;  Surgeon: Danie Binder, MD;  Location: [facility];  Service: Endoscopy;  Laterality: N/A;  200 - moved to 1/29 @ 12:45  . ESOPHAGOGASTRODUODENOSCOPY N/A 09/09/2014   Procedure: ESOPHAGOGASTRODUODENOSCOPY (EGD);  Surgeon: Danie Binder, MD;  Location: [facility];  Service: Endoscopy;  Laterality: N/A;  . THYROIDECTOMY N/A 02/05/2017   Procedure: THYROIDECTOMY;  Surgeon: Aviva Signs, MD;  Location: [facility];  Service: General;  Laterality: N/A;  . TUBAL LIGATION     Social History   Social History  . Marital status: Divorced    Spouse name: N/A  . Number of children: N/A  . Years of education: N/A   Social History Main Topics  . Smoking status: Former Smoker    Packs/day: 0.25    Years: 2.00    Types: Cigarettes    Quit date: 01/31/2010  . Smokeless tobacco: Never Used  . Alcohol use No  . Drug use: No  . Sexual activity: Not Currently    Birth control/ protection: Surgical   Other Topics Concern  . None   Social History Narrative  . None   Outpatient Encounter Prescriptions as of 05/06/2017  Medication Sig  . acetaminophen (TYLENOL) 650 MG CR tablet Take 1,300 mg by mouth daily as  needed for pain.  Marland Kitchen aspirin EC 81 MG tablet Take 81 mg by mouth at bedtime.   . calcium-vitamin D (OSCAL WITH D) 500-200 MG-UNIT tablet Take 1 tablet by mouth 2 (two) times daily.  Marland Kitchen HYDROcodone-acetaminophen (NORCO/VICODIN) 5-325 MG tablet Take 1-2 tablets by mouth every 4 (four) hours as needed for moderate pain.  Marland Kitchen losartan (COZAAR) 50 MG tablet Take 1 tablet by mouth daily.  . metFORMIN (GLUCOPHAGE) 500 MG tablet Take 500 mg by mouth daily with breakfast.  . omeprazole (PRILOSEC) 20 MG capsule Take 20 mg by mouth daily.  . Potassium 99 MG TABS Take 1 tablet by mouth daily.  . [DISCONTINUED] levothyroxine (SYNTHROID, LEVOTHROID) 112 MCG tablet Take 1 tablet (112 mcg total) by mouth daily before breakfast.  . [DISCONTINUED] levothyroxine (SYNTHROID, LEVOTHROID) 125 MCG tablet Take 1 tablet (125 mcg total) by mouth daily before breakfast.   No facility-administered encounter medications on file as of 05/06/2017.    ALLERGIES: No Known Allergies  VACCINATION STATUS:  There is no immunization history on file for this patient.  HPI 71 year old female patient with medical history as above. She is being seen in f/u for locally invasive multifocal papillary thyroid cancer, status post thyroidectomy and post thyroidectomy hypothyroidism. - She underwent total thyroidectomy on 02/05/2017 which revealed a  papillary thyroid  carcinoma on the right lower thyroid-0.5 cm foci with local extrathyroidal extension and another 0.2 cm separate tumor in the right lobe. The left lobe was said to be consistent with hyperplastic nodule. - She underwent I-131 thyroid lamina ablation on 04/15/2017 followed by post therapy whole body scan. 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 - She is recovering from her surgery very well. She was initiated on levothyroxine 112 g by mouth every  morning. She reports compliance to this medication.  - She was known to have multinodular goiter for at least 7 years. - Series of thyroid/neck ultrasound results showed that the thyroid continued to grow over time as well as increasing nodule size in bilateral lobes. - she also had  Feelings of choking sensations on and off and shortness of breath intermittently. - She denies any family history of thyroid cancer, however one of her sisters have hyperthyroidism status post treatment with what appears to be radioactive iodine therapy.  Review of Systems  Constitutional: + steady weight gain , + fatigue, no subjective hyperthermia, no subjective hypothermia Eyes: no blurry vision, no xerophthalmia ENT: no sore throat, + thyroidectomy, - dysphagia/odynophagia, no hoarseness Cardiovascular: no Chest Pain, no Shortness of Breath, no palpitations, no leg swelling Respiratory: no cough, no SOB Gastrointestinal: no Nausea/Vomiting/Diarhhea Musculoskeletal: no muscle/joint aches Skin: no rashes Neurological: no tremors, no numbness, no tingling, no dizziness Psychiatric: no depression, no anxiety  Objective:    BP (!) 145/92   Pulse 99   Ht 5\' 3"  (1.6 m)   Wt 166 lb (75.3 kg)   BMI 29.41 kg/m   Wt Readings from Last 3 Encounters:  05/06/17 166 lb (75.3 kg)  03/18/17 162 lb (73.5 kg)  02/25/17 161 lb (73 kg)    Physical Exam  Constitutional: Slightly over weight for hight, not in acute distress, normal state of mind Eyes: PERRLA, EOMI, no exophthalmos ENT: moist mucous membranes, + Healing post thyroidectomy scar on anterior lower neck , no cervical lymphadenopathy Cardiovascular: normal precordial activity, Regular Rate and Rhythm, no Murmur/Rubs/Gallops Respiratory:  adequate breathing efforts, no gross chest deformity, Clear to auscultation bilaterally Gastrointestinal: abdomen soft, Non -tender, No distension, Bowel Sounds present Musculoskeletal: no gross deformities, strength  intact in all four extremities Skin: moist, warm, no rashes Neurological: no tremor with outstretched hands, Deep tendon reflexes normal in all four extremities.  CMP ( most recent) CMP     Component Value Date/Time   NA 136 02/06/2017 0444   K 3.9 02/06/2017 0444   CL 103 02/06/2017 0444   CO2 26 02/06/2017 0444   GLUCOSE 153 (H) 02/06/2017 0444   BUN 10 02/06/2017 0444   CREATININE 0.76 02/06/2017 0444   CALCIUM 9.2 02/06/2017 0444   PROT 6.6 02/06/2017 0444   ALBUMIN 3.2 (L) 02/06/2017 0444   AST 18 02/06/2017 0444   ALT 13 (L) 02/06/2017 0444   ALKPHOS 68 02/06/2017 0444   BILITOT 0.8 02/06/2017 0444   GFRNONAA >60 02/06/2017 0444   GFRAA >60 02/06/2017 0444   Results for NATAHLIA, HOGGARD (MRN [id number]) as of 05/06/2017 12:06  Ref. Range 04/29/2017 11:36  TSH Latest Ref Range: 0.40 - 4.50 Huang/L 2.96  T4,Free(Direct) Latest Ref Range: 0.8 - 1.8 ng/dL 1.4  Thyroglobulin Latest Units: ng/mL 1.1 (L)  Thyroglobulin Ab Latest Ref Range: < or = 1 IU/mL <1  Fine-needle aspiration cytology of the thyroid on 10/23/2016 showed: THYROID, FINE NEEDLE ASPIRATION, LEFT (SPECIMEN 1 OF 1, COLLECTED 10/23/16): CONSISTENT WITH BENIGN FOLLICULAR NODULE (BETHESDA CATEGORY II).  02/05/2017: She underwent total thyroidectomy: 02/05/2017: INAL DIAGNOSIS Diagnosis 1. Thyroid, lobectomy, left - NODULAR HYPERPLASIA (MULTINODULAR GOITER). 2. Thyroid, lobectomy, right - PAPILLARY THYROID CARCINOMA, TWO TUMORS, 0.5 AND 0.2 CM. - PAPILLARY CARCINOMA FOCALLY EXTENDS INTO THE PERITHYROIDAL CONNECTIVE TISSUE. - HURTHLE CELL ADENOMATOUS NODULE. - NODULAR HYPERPLASIA.  TNM code: pT1a , pNX.   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  No distant sites of iodine-avid metastatic thyroid cancer identified.  Assessment & Plan:   1. 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 Her surgical sample unexpectedly revealed occult malignancy on the right lobe of the thyroid, 0.5 cm  with focal extrathyroidal extension and 0.2 cm foci.  TNM code: pT1a , pNX. - She is status post total thyroidectomy and I-131 thyroid remnant ablation on 04/15/2017.  - Thyroglobulin level and thyroglobulin antibodies are  low  on the day of or body scan. 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  No distant sites of iodine-avid metastatic thyroid cancer Identified.  *  She is approached for dedicated thyroid/neck ultrasound to be done as soon as possible. Expects will depend on finding.  2. Postsurgical hypothyroidism  Based on her thyroid function test on 03/14/2017,  she would benefit from slight increase in his dose of levothyroxine.  - The goal of therapy in thyroid cancer suppressive therapy is to keep TSH between 0.1-0.5 . - I will prescribe levothyroxine 125 g by mouth every morning.   - We discussed about correct intake of levothyroxine, at fasting, with water, separated by at least 30 minutes from breakfast, and separated by more than 4 hours from calcium, iron, multivitamins, acid reflux medications (PPIs). -Patient is made aware of the fact that thyroid hormone replacement is needed for life, dose to be adjusted by periodic monitoring of thyroid function tests.  40 minutes of care time was dedicated to this patient, 50% of which was dedicated on counseling, discussing completion therapy for thyroid cancer and long-term surveillance plans.  - I advised patient to maintain close follow up with Abran Richard, MD for primary care needs. Follow up plan: Return in about 2 weeks (around 05/20/2017) for Thyroid / Neck Ultrasound.  Glade Lloyd,  MD Phone: [phone]  Fax: [phone]  This note was partially dictated with voice recognition software. Similar sounding words can be transcribed inadequately or may not  be corrected upon review.  05/06/2017, 12:04 PM

## 2017-05-19 ENCOUNTER — Ambulatory Visit (HOSPITAL_COMMUNITY)
Admission: RE | Admit: 2017-05-19 | Discharge: 2017-05-19 | Disposition: A | Discharge status: Home / Self Care | Payer: Medicare PPO | Source: Ambulatory Visit | Attending: "Endocrinology | Admitting: "Endocrinology

## 2017-05-19 DIAGNOSIS — C73 Malignant neoplasm of thyroid gland: Secondary | ICD-10-CM | POA: Insufficient documentation

## 2017-05-19 DIAGNOSIS — E890 Postprocedural hypothyroidism: Secondary | ICD-10-CM | POA: Diagnosis not present

## 2017-05-26 ENCOUNTER — Ambulatory Visit (INDEPENDENT_AMBULATORY_CARE_PROVIDER_SITE_OTHER): Payer: Medicare PPO | Admitting: "Endocrinology

## 2017-05-26 ENCOUNTER — Encounter: Payer: Self-pay | Admitting: "Endocrinology

## 2017-05-26 VITALS — BP 134/81 | HR 81 | Ht 63.0 in | Wt 162.0 lb

## 2017-05-26 DIAGNOSIS — C73 Malignant neoplasm of thyroid gland: Secondary | ICD-10-CM

## 2017-05-26 DIAGNOSIS — E890 Postprocedural hypothyroidism: Secondary | ICD-10-CM | POA: Diagnosis not present

## 2017-05-26 MED ORDER — LEVOTHYROXINE SODIUM 125 MCG PO TABS: 125.0000 ug | ORAL_TABLET | Freq: Every day | ORAL | 6 refills | Status: DC

## 2017-05-26 NOTE — Progress Notes (Signed)
Subjective:    Patient ID: Kristina Huang, female    DOB: [birth year], PCP Abran Richard, MD   Past Medical History:  Diagnosis Date  . Acid reflux   . Arthritis   . Cancer ([place])    Thyroid  . Diabetes mellitus without complication (HCC)    fasting 100-130  . Family history of adverse reaction to anesthesia   . Hypercholesterolemia   . Hypertension   . 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 BACK SURGERY     spinal fusion  . BUNIONECTOMY Bilateral   . CHOLECYSTECTOMY     Dr. Irving Shows  . COLONOSCOPY  2009   Dr. Oneida Alar: moderate internal hemorrhoids, frequent sigmoid colon diverticula, polyps X 3, one large adenoma otherwise hyperplastic  . COLONOSCOPY N/A 09/09/2014   Procedure: COLONOSCOPY;  Surgeon: Danie Binder, MD;  [facility];  Service: Endoscopy;  Laterality: N/A;  200 - moved to 1/29 @ 12:45  . ESOPHAGOGASTRODUODENOSCOPY N/A 09/09/2014   Procedure: ESOPHAGOGASTRODUODENOSCOPY (EGD);  Surgeon: Danie Binder, MD;  Location: [facility];  Service: Endoscopy;  Laterality: N/A;  . THYROIDECTOMY N/A 02/05/2017   Procedure: THYROIDECTOMY;  Surgeon: Aviva Signs, MD;  Location: [facility];  Service: General;  Laterality: N/A;  . TUBAL LIGATION     Social History   Social History  . Marital status: Divorced    Spouse name: N/A  . Number of children: N/A  . Years of education: N/A   Social History Main Topics  . Smoking status: Former Smoker    Packs/day: 0.25    Years: 2.00    Types: Cigarettes    Quit date: 01/31/2010  . Smokeless tobacco: Never Used  . Alcohol use No  . Drug use: No  . Sexual activity: Not Currently    Birth control/ protection: Surgical   Other Topics Concern  . None   Social History Narrative  . None   Outpatient Encounter Prescriptions as of 05/26/2017  Medication Sig  . acetaminophen (TYLENOL) 650 MG CR tablet Take 1,300 mg by mouth daily as  needed for pain.  Marland Kitchen aspirin EC 81 MG tablet Take 81 mg by mouth at bedtime.   . calcium-vitamin D (OSCAL WITH D) 500-200 MG-UNIT tablet Take 1 tablet by mouth 2 (two) times daily.  Marland Kitchen HYDROcodone-acetaminophen (NORCO/VICODIN) 5-325 MG tablet Take 1-2 tablets by mouth every 4 (four) hours as needed for moderate pain.  Marland Kitchen levothyroxine (SYNTHROID, LEVOTHROID) 125 MCG tablet Take 1 tablet (125 mcg total) by mouth daily before breakfast.  . losartan (COZAAR) 50 MG tablet Take 1 tablet by mouth daily.  . metFORMIN (GLUCOPHAGE) 500 MG tablet Take 500 mg by mouth daily with breakfast.  . omeprazole (PRILOSEC) 20 MG capsule Take 20 mg by mouth daily.  . Potassium 99 MG TABS Take 1 tablet by mouth daily.  . [DISCONTINUED] levothyroxine (SYNTHROID, LEVOTHROID) 125 MCG tablet Take 1 tablet (125 mcg total) by mouth daily before breakfast.   No facility-administered encounter medications on file as of 05/26/2017.    ALLERGIES: No Known Allergies  VACCINATION STATUS:  There is no immunization history on file for this patient.  HPI 71 year old female patient with medical history as above. She is being seen in f/u for locally invasive multifocal papillary thyroid cancer, status post thyroidectomy and post thyroidectomy hypothyroidism. - She underwent total thyroidectomy on 02/05/2017 which revealed a  papillary thyroid carcinoma  on the right lower thyroid-0.5 cm foci with local extrathyroidal extension and another 0.2 cm separate tumor in the right lobe. The left lobe was said to be consistent with hyperplastic nodule. - She underwent I-131 thyroid lamina ablation on 04/15/2017 followed by post therapy whole body scan. 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 - She is recovering from her surgery very well. She was initiated on levothyroxine 112 g by mouth every morning. She  reports compliance to this medication.  - She was known to have multinodular goiter for at least 7 years. - Series of thyroid/neck ultrasound results showed that the thyroid continued to grow over time as well as increasing nodule size in bilateral lobes. - she also had  Feelings of choking sensations on and off and shortness of breath intermittently. - She denies any family history of thyroid cancer, however one of her sisters have hyperthyroidism status post treatment with what appears to be radioactive iodine therapy.  Review of Systems  Constitutional: + steady weight gain , + fatigue, no subjective hyperthermia, no subjective hypothermia Eyes: no blurry vision, no xerophthalmia ENT: no sore throat, + thyroidectomy, - dysphagia/odynophagia, no hoarseness Cardiovascular: no Chest Pain, no Shortness of Breath, no palpitations, no leg swelling Respiratory: no cough, no SOB Gastrointestinal: no Nausea/Vomiting/Diarhhea Musculoskeletal: no muscle/joint aches Skin: no rashes Neurological: no tremors, no numbness, no tingling, no dizziness Psychiatric: no depression, no anxiety  Objective:    BP 134/81   Pulse 81   Ht 5\' 3"  (1.6 m)   Wt 162 lb (73.5 kg)   BMI 28.70 kg/m   Wt Readings from Last 3 Encounters:  05/26/17 162 lb (73.5 kg)  05/06/17 166 lb (75.3 kg)  03/18/17 162 lb (73.5 kg)    Physical Exam  Constitutional: Slightly over weight for hight, not in acute distress, normal state of mind Eyes: PERRLA, EOMI, no exophthalmos ENT: moist mucous membranes, + Healing post thyroidectomy scar on anterior lower neck , no cervical lymphadenopathy Cardiovascular: normal precordial activity, Regular Rate and Rhythm, no Murmur/Rubs/Gallops Respiratory:  adequate breathing efforts, no gross chest deformity, Clear to auscultation bilaterally Gastrointestinal: abdomen soft, Non -tender, No distension, Bowel Sounds present Musculoskeletal: no gross deformities, strength intact in all four  extremities Skin: moist, warm, no rashes Neurological: no tremor with outstretched hands, Deep tendon reflexes normal in all four extremities.  CMP ( most recent) CMP     Component Value Date/Time   NA 136 02/06/2017 0444   K 3.9 02/06/2017 0444   CL 103 02/06/2017 0444   CO2 26 02/06/2017 0444   GLUCOSE 153 (H) 02/06/2017 0444   BUN 10 02/06/2017 0444   CREATININE 0.76 02/06/2017 0444   CALCIUM 9.2 02/06/2017 0444   PROT 6.6 02/06/2017 0444   ALBUMIN 3.2 (L) 02/06/2017 0444   AST 18 02/06/2017 0444   ALT 13 (L) 02/06/2017 0444   ALKPHOS 68 02/06/2017 0444   BILITOT 0.8 02/06/2017 0444   GFRNONAA >60 02/06/2017 0444   GFRAA >60 02/06/2017 0444   Results for KENEDI, CILIA (MRN [id number]) as of 05/06/2017 12:06  Ref. Range 04/29/2017 11:36  TSH Latest Ref Range: 0.40 - 4.50 Huang/L 2.96  T4,Free(Direct) Latest Ref Range: 0.8 - 1.8 ng/dL 1.4  Thyroglobulin Latest Units: ng/mL 1.1 (L)  Thyroglobulin Ab Latest Ref Range: < or = 1 IU/mL <1      Fine-needle aspiration  cytology of the thyroid on 10/23/2016 showed: THYROID, FINE NEEDLE ASPIRATION, LEFT (SPECIMEN 1 OF 1, COLLECTED 10/23/16): CONSISTENT WITH BENIGN FOLLICULAR NODULE (BETHESDA CATEGORY II).  02/05/2017: She underwent total thyroidectomy: 02/05/2017: INAL DIAGNOSIS Diagnosis 1. Thyroid, lobectomy, left - NODULAR HYPERPLASIA (MULTINODULAR GOITER). 2. Thyroid, lobectomy, right - PAPILLARY THYROID CARCINOMA, TWO TUMORS, 0.5 AND 0.2 CM. - PAPILLARY CARCINOMA FOCALLY EXTENDS INTO THE PERITHYROIDAL CONNECTIVE TISSUE. - HURTHLE CELL ADENOMATOUS NODULE. - NODULAR HYPERPLASIA.  TNM code: pT1a , pNX.   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  No distant sites of iodine-avid metastatic thyroid cancer Identified.  Surveillance thyroid/neck ultrasound from 05/19/2017 showed:  Surgically absent  thyroid lobes. A single morphologically unremarkable 0.8 cm left cervical lymph node is noted. No pathologic adenopathy.  IMPRESSION: No evidence of residual/ recurrent tissue post thyroidectomy  Assessment & Plan:   1. 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 Her surgical sample unexpectedly revealed occult malignancy on the right lobe of the thyroid, 0.5 cm  with focal extrathyroidal extension and 0.2 cm foci.  TNM code: pT1a , pNX. - She is status post total thyroidectomy and I-131 thyroid remnant ablation on 04/15/2017.  - Thyroglobulin level and thyroglobulin antibodies are  low  on the day of or body scan. 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  No distant sites of iodine-avid metastatic thyroid cancer Identified.  - Surveillance  thyroid/neck ultrasound - is unremarkable except for nonspecific 0.8 cm lymph node on the left cervical region.  - She will not require any intervention at this time. She will have repeat thyroid/neck ultrasound in 6 month.    2. Postsurgical hypothyroidism  Based on her thyroid function test on 03/14/2017,  she would benefit from slight increase in his dose of levothyroxine.  - The goal of therapy in thyroid cancer suppressive therapy is to keep TSH between 0.1-0.5 . - I will prescribe levothyroxine 125 g by mouth every morning.   - We discussed about correct intake of levothyroxine, at fasting, with water, separated by at least 30 minutes from breakfast, and separated by more than 4 hours from calcium, iron, multivitamins, acid reflux medications (PPIs). -Patient is made aware of the fact that thyroid hormone replacement is needed for life, dose to be adjusted by periodic monitoring of thyroid function tests.  - I  advised patient to maintain close follow up with Abran Richard, MD for primary care needs. Follow up plan: Return in about 6 months (around 11/24/2017) for follow up with pre-visit labs, Thyroid / Neck Ultrasound.  Glade Lloyd, MD Phone: [phone]  Fax: [phone]  This note was partially dictated with voice recognition software. Similar sounding words can be transcribed inadequately or may not  be corrected upon review.  05/26/2017, 1:05 PM

## 2017-08-13 ENCOUNTER — Encounter: Payer: Self-pay | Admitting: Gastroenterology

## 2017-08-19 ENCOUNTER — Other Ambulatory Visit (HOSPITAL_COMMUNITY): Payer: Self-pay | Admitting: Internal Medicine

## 2017-08-19 DIAGNOSIS — Z1231 Encounter for screening mammogram for malignant neoplasm of breast: Secondary | ICD-10-CM

## 2017-08-21 ENCOUNTER — Ambulatory Visit (HOSPITAL_COMMUNITY)
Admission: RE | Admit: 2017-08-21 | Discharge: 2017-08-21 | Disposition: A | Discharge status: Home / Self Care | Payer: Medicare PPO | Source: Ambulatory Visit | Attending: Internal Medicine | Admitting: Internal Medicine

## 2017-08-21 DIAGNOSIS — Z1231 Encounter for screening mammogram for malignant neoplasm of breast: Secondary | ICD-10-CM | POA: Diagnosis present

## 2017-09-17 ENCOUNTER — Ambulatory Visit (INDEPENDENT_AMBULATORY_CARE_PROVIDER_SITE_OTHER): Payer: Self-pay

## 2017-09-17 DIAGNOSIS — Z86010 Personal history of colonic polyps: Secondary | ICD-10-CM

## 2017-09-17 MED ORDER — PEG 3350-KCL-NA BICARB-NACL 420 G PO SOLR: 4000.0000 mL | ORAL | 0 refills | Status: DC

## 2017-09-17 NOTE — Progress Notes (Signed)
Gastroenterology Pre-Procedure Review  Request Date:09/17/17 Requesting Physician: Dr.Hunter and 3 year recall (last tcs 09/09/2014- tubular adenoma)   PATIENT REVIEW QUESTIONS: The patient responded to the following health history questions as indicated:    1. Diabetes Melitis: yes metformin 2. Joint replacements in the past 12 months: no 3. Major health problems in the past 3 months: no 4. Has an artificial valve or MVP: no 5. Has a defibrillator: no 6. Has been advised in past to take antibiotics in advance of a procedure like teeth cleaning: no 7. Family history of colon cancer: no  8. Alcohol Use: no 9. History of sleep apnea: no  10. History of coronary artery or other vascular stents placed within the last 12 months: no 11. History of any prior anesthesia complications: no    MEDICATIONS & ALLERGIES:    Patient reports the following regarding taking any blood thinners:   Plavix? no Aspirin? Yes 81mg  Coumadin?no  Brilinta? no Xarelto?no  Eliquis? no Pradaxa? no Savaysa? no Effient? no  Patient confirms/reports the following medications:  Current Outpatient Medications  Medication Sig Dispense Refill  . acetaminophen (TYLENOL) 650 MG CR tablet Take 1,300 mg by mouth daily as needed for pain.    Marland Kitchen aspirin EC 81 MG tablet Take 81 mg by mouth at bedtime.     . calcium-vitamin D (OSCAL WITH D) 500-200 MG-UNIT tablet Take 1 tablet by mouth 2 (two) times daily. 60 tablet 1  . hydrochlorothiazide (HYDRODIURIL) 25 MG tablet Take 25 mg by mouth daily.    Marland Kitchen levothyroxine (SYNTHROID, LEVOTHROID) 125 MCG tablet Take 1 tablet (125 mcg total) by mouth daily before breakfast. 30 tablet 6  . losartan (COZAAR) 50 MG tablet Take 1 tablet by mouth daily.    . metFORMIN (GLUCOPHAGE) 500 MG tablet Take 500 mg by mouth daily with breakfast.    . omeprazole (PRILOSEC) 20 MG capsule Take 20 mg by mouth daily.    . Potassium 99 MG TABS Take 1 tablet by mouth daily.     No current  facility-administered medications for this visit.     Patient confirms/reports the following allergies:  No Known Allergies  No orders of the defined types were placed in this encounter.   AUTHORIZATION INFORMATION Primary Insurance: [place],   [place] #: [id number] Pre-Cert / Josem Kaufmann required: no    SCHEDULE INFORMATION: Procedure has been scheduled as follows:  Date:10/24/17, Time: 9:30 Location: [facility] Dr.Fields  This Gastroenterology Pre-Precedure Review Form is being routed to the following provider(s): Neil Crouch PA-C

## 2017-09-17 NOTE — Patient Instructions (Signed)
Kristina Huang   [birth year] MRN: [id number]    Procedure Date: 10/24/17 Time to register: 8:30 Place to register: [facility] Procedure Time: 9:30 Scheduled provider: Dr.Fields  PREPARATION FOR COLONOSCOPY WITH TRI-LYTE SPLIT PREP  Please notify us immediately if you are diabetic, take iron supplements, or if you are on Coumadin or any other blood thinners.   Please hold the following medications: I will mail you a letter with this information  You will need to purchase 1 fleet enema and 1 box of Bisacodyl 24m tablets.   2 DAYS BEFORE PROCEDURE:  DATE: 10/22/17   DAY: Wednesday Begin clear liquid diet AFTER your lunch meal. NO SOLID FOODS after this point.  1 DAY BEFORE PROCEDURE:  DATE: 10/23/17   DAY: Thursday  Continue clear liquids the entire day - NO SOLID FOOD.   Diabetic medications adjustments for today: see letter  At 2:00 pm:  Take 2 Bisacodyl tablets.   At 4:00pm:  Start drinking your solution. Make sure you mix well per instructions on the bottle. Try to drink 1 (one) 8 ounce glass every 10-15 minutes until you have consumed HALF the jug. You should complete by 6:00pm.You must keep the left over solution refrigerated until completed next day.  Continue clear liquids. You must drink plenty of clear liquids to prevent dehyration and kidney failure. Nothing to eat or drink after midnight.  EXCEPTION: If you take medications for your heart, blood pressure or breathing, you may take these medications with a small amount of clear liquid.    DAY OF PROCEDURE:   DATE: 10/24/17  DAY: Friday  Diabetic medications adjustments for today: see letter   Five hours before your procedure time @ 4:30am:  Finish remaining amout of bowel prep, drinking 1 (one) 8 ounce glass every 10-15 minutes until complete. You have two hours to consume remaining prep.   Three hours before your procedure time '@6' :30am:  Nothing by mouth.   At least one hour before going to the hospital:   Give yourself one Fleet enema. You may take your morning medications with sip of water unless we have instructed otherwise.      Please see below for Dietary Information.  CLEAR LIQUIDS INCLUDE:  Water Jello (NOT red in color)   Ice Popsicles (NOT red in color)   Tea (sugar ok, no milk/cream) Powdered fruit flavored drinks  Coffee (sugar ok, no milk/cream) Gatorade/ Lemonade/ Kool-Aid  (NOT red in color)   Juice: apple, white grape, white cranberry Soft drinks  Clear bullion, consomme, broth (fat free beef/chicken/vegetable)  Carbonated beverages (any kind)  Strained chicken noodle soup Hard Candy   Remember: Clear liquids are liquids that will allow you to see your fingers on the other side of a clear glass. Be sure liquids are NOT red in color, and not cloudy, but CLEAR.  DO NOT EAT OR DRINK ANY OF THE FOLLOWING:  Dairy products of any kind   Cranberry juice Tomato juice / V8 juice   Grapefruit juice Orange juice     Red grape juice  Do not eat any solid foods, including such foods as: cereal, oatmeal, yogurt, fruits, vegetables, creamed soups, eggs, bread, crackers, pureed foods in a blender, etc.   HELPFUL HINTS FOR DRINKING PREP SOLUTION:   Make sure prep is extremely cold. Mix and refrigerate the the morning of the prep. You may also put in the freezer.   You may try mixing some Crystal Light or Country Time Lemonade if you  prefer. Mix in small amounts; add more if necessary.  Try drinking through a straw  Rinse mouth with water or a mouthwash between glasses, to remove after-taste.  Try sipping on a cold beverage /ice/ popsicles between glasses of prep.  Place a piece of sugar-free hard candy in mouth between glasses.  If you become nauseated, try consuming smaller amounts, or stretch out the time between glasses. Stop for 30-60 minutes, then slowly start back drinking.        OTHER INSTRUCTIONS  You will need a responsible adult at least 72 years of age to  accompany you and drive you home. This person must remain in the waiting room during your procedure. The hospital will cancel your procedure if you do not have a responsible adult with you.   1. Wear loose fitting clothing that is easily removed. 2. Leave jewelry and other valuables at home.  3. Remove all body piercing jewelry and leave at home. 4. Total time from sign-in until discharge is approximately 2-3 hours. 5. You should go home directly after your procedure and rest. You can resume normal activities the day after your procedure. 6. The day of your procedure you should not:  Drive  Make legal decisions  Operate machinery  Drink alcohol  Return to work   You may call the office (Dept: [phone]) before 5:00pm, or page the doctor on call [phone]) after 5:00pm, for further instructions, if necessary.   Insurance Information YOU WILL NEED TO CHECK WITH YOUR INSURANCE COMPANY FOR THE BENEFITS OF COVERAGE YOU HAVE FOR THIS PROCEDURE.  UNFORTUNATELY, NOT ALL INSURANCE COMPANIES HAVE BENEFITS TO COVER ALL OR PART OF THESE TYPES OF PROCEDURES.  IT IS YOUR RESPONSIBILITY TO CHECK YOUR BENEFITS, HOWEVER, WE WILL BE GLAD TO ASSIST YOU WITH ANY CODES YOUR INSURANCE COMPANY MAY NEED.    PLEASE NOTE THAT MOST INSURANCE COMPANIES WILL NOT COVER A SCREENING COLONOSCOPY FOR PEOPLE UNDER THE AGE OF 50  IF YOU HAVE BCBS INSURANCE, YOU MAY HAVE BENEFITS FOR A SCREENING COLONOSCOPY BUT IF POLYPS ARE FOUND THE DIAGNOSIS WILL CHANGE AND THEN YOU MAY HAVE A DEDUCTIBLE THAT WILL NEED TO BE MET. SO PLEASE MAKE SURE YOU CHECK YOUR BENEFITS FOR A SCREENING COLONOSCOPY AS WELL AS A DIAGNOSTIC COLONOSCOPY.

## 2017-09-18 NOTE — Progress Notes (Addendum)
Ok to schedule. Hold metformin day before TCS and AM of TCS. May resume metformin after her procedure.

## 2017-09-22 NOTE — Progress Notes (Signed)
Letter mailed to the pt. 

## 2017-10-09 ENCOUNTER — Other Ambulatory Visit: Payer: Self-pay

## 2017-10-09 MED ORDER — LEVOTHYROXINE SODIUM 125 MCG PO TABS: 125.0000 ug | ORAL_TABLET | Freq: Every day | ORAL | 0 refills | Status: DC

## 2017-10-24 ENCOUNTER — Encounter (HOSPITAL_COMMUNITY)
Admission: RE | Disposition: A | Discharge status: Home / Self Care | Payer: Self-pay | Source: Ambulatory Visit | Attending: Gastroenterology

## 2017-10-24 ENCOUNTER — Ambulatory Visit (HOSPITAL_COMMUNITY)
Admission: RE | Admit: 2017-10-24 | Discharge: 2017-10-24 | Disposition: A | Discharge status: Home / Self Care | Payer: Medicare PPO | Source: Ambulatory Visit | Attending: Gastroenterology | Admitting: Gastroenterology

## 2017-10-24 ENCOUNTER — Other Ambulatory Visit: Payer: Self-pay

## 2017-10-24 ENCOUNTER — Encounter (HOSPITAL_COMMUNITY): Payer: Self-pay | Admitting: *Deleted

## 2017-10-24 DIAGNOSIS — Z860100 Personal history of colon polyps, unspecified: Secondary | ICD-10-CM

## 2017-10-24 DIAGNOSIS — E890 Postprocedural hypothyroidism: Secondary | ICD-10-CM | POA: Insufficient documentation

## 2017-10-24 DIAGNOSIS — K219 Gastro-esophageal reflux disease without esophagitis: Secondary | ICD-10-CM | POA: Diagnosis not present

## 2017-10-24 DIAGNOSIS — E119 Type 2 diabetes mellitus without complications: Secondary | ICD-10-CM | POA: Diagnosis not present

## 2017-10-24 DIAGNOSIS — Z7984 Long term (current) use of oral hypoglycemic drugs: Secondary | ICD-10-CM | POA: Diagnosis not present

## 2017-10-24 DIAGNOSIS — Z79890 Hormone replacement therapy (postmenopausal): Secondary | ICD-10-CM | POA: Diagnosis not present

## 2017-10-24 DIAGNOSIS — Z85850 Personal history of malignant neoplasm of thyroid: Secondary | ICD-10-CM | POA: Insufficient documentation

## 2017-10-24 DIAGNOSIS — K648 Other hemorrhoids: Secondary | ICD-10-CM | POA: Diagnosis not present

## 2017-10-24 DIAGNOSIS — D120 Benign neoplasm of cecum: Secondary | ICD-10-CM | POA: Diagnosis not present

## 2017-10-24 DIAGNOSIS — Z1211 Encounter for screening for malignant neoplasm of colon: Secondary | ICD-10-CM | POA: Diagnosis not present

## 2017-10-24 DIAGNOSIS — I10 Essential (primary) hypertension: Secondary | ICD-10-CM | POA: Diagnosis not present

## 2017-10-24 DIAGNOSIS — E7800 Pure hypercholesterolemia, unspecified: Secondary | ICD-10-CM | POA: Insufficient documentation

## 2017-10-24 DIAGNOSIS — Z87891 Personal history of nicotine dependence: Secondary | ICD-10-CM | POA: Diagnosis not present

## 2017-10-24 DIAGNOSIS — Z7982 Long term (current) use of aspirin: Secondary | ICD-10-CM | POA: Diagnosis not present

## 2017-10-24 DIAGNOSIS — D122 Benign neoplasm of ascending colon: Secondary | ICD-10-CM | POA: Insufficient documentation

## 2017-10-24 DIAGNOSIS — Z79899 Other long term (current) drug therapy: Secondary | ICD-10-CM | POA: Diagnosis not present

## 2017-10-24 DIAGNOSIS — K644 Residual hemorrhoidal skin tags: Secondary | ICD-10-CM | POA: Diagnosis not present

## 2017-10-24 HISTORY — PX: COLONOSCOPY: SHX5424

## 2017-10-24 LAB — GLUCOSE, CAPILLARY: Glucose-Capillary: 108 mg/dL — ABNORMAL HIGH (ref 65–99)

## 2017-10-24 SURGERY — COLONOSCOPY
Anesthesia: Moderate Sedation

## 2017-10-24 MED ORDER — MIDAZOLAM HCL 5 MG/5ML IJ SOLN
INTRAMUSCULAR | Status: DC | PRN
Start: 2017-10-24 — End: 2017-10-24
  Administered 2017-10-24 (×2): 2 mg via INTRAVENOUS

## 2017-10-24 MED ORDER — SODIUM CHLORIDE 0.9 % IV SOLN
INTRAVENOUS | Status: DC
  Administered 2017-10-24: 09:00:00 via INTRAVENOUS

## 2017-10-24 MED ORDER — MIDAZOLAM HCL 5 MG/5ML IJ SOLN
INTRAMUSCULAR | Status: AC
  Filled 2017-10-24: qty 10

## 2017-10-24 MED ORDER — SPOT INK MARKER SYRINGE KIT
PACK | SUBMUCOSAL | Status: DC | PRN
  Administered 2017-10-24: 2 mL via SUBMUCOSAL

## 2017-10-24 MED ORDER — MEPERIDINE HCL 100 MG/ML IJ SOLN
INTRAMUSCULAR | Status: DC | PRN
Start: 2017-10-24 — End: 2017-10-24
  Administered 2017-10-24: 50 mg
  Administered 2017-10-24: 25 mg

## 2017-10-24 MED ORDER — MEPERIDINE HCL 100 MG/ML IJ SOLN
INTRAMUSCULAR | Status: AC
  Filled 2017-10-24: qty 2

## 2017-10-24 MED ORDER — SPOT INK MARKER SYRINGE KIT
PACK | SUBMUCOSAL | Status: AC
  Filled 2017-10-24: qty 5

## 2017-10-24 NOTE — H&P (Addendum)
Primary Care Physician:  Abran Richard, MD Primary Gastroenterologist:  Dr. Oneida Alar  Pre-Procedure History & Physical: HPI:  Kristina Huang is a 72 y.o. female here for  [place].  Past Medical History:  Diagnosis Date  . Acid reflux   . Arthritis   . Cancer ([place])    Thyroid  . Diabetes mellitus without complication (HCC)    fasting 100-130  . Family history of adverse reaction to anesthesia   . Hypercholesterolemia   . Hypertension   . 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 BACK SURGERY     spinal fusion  . BUNIONECTOMY Bilateral   . CHOLECYSTECTOMY     Dr. Irving Shows  . COLONOSCOPY  2009   Dr. Oneida Alar: moderate internal hemorrhoids, frequent sigmoid colon diverticula, polyps X 3, one large adenoma otherwise hyperplastic  . COLONOSCOPY N/A 09/09/2014   Procedure: COLONOSCOPY;  Surgeon: Danie Binder, MD;  [facility];  Service: Endoscopy;  Laterality: N/A;  200 - moved to 1/29 @ 12:45  . ESOPHAGOGASTRODUODENOSCOPY N/A 09/09/2014   Procedure: ESOPHAGOGASTRODUODENOSCOPY (EGD);  Surgeon: Danie Binder, MD;  [facility];  Service: Endoscopy;  Laterality: N/A;  . THYROIDECTOMY N/A 02/05/2017   Procedure: THYROIDECTOMY;  Surgeon: Aviva Signs, MD;  Location: [facility];  Service: General;  Laterality: N/A;  . TUBAL LIGATION      Prior to Admission medications   Medication Sig Start Date End Date Taking? Authorizing Provider  acetaminophen (TYLENOL) 500 MG tablet Take 1,500 mg by mouth every 6 (six) hours as needed for moderate pain or headache.   Yes [provider]  aspirin EC 81 MG tablet Take 81 mg by mouth at bedtime.    Yes [provider]  calcium-vitamin D (OSCAL WITH D) 500-200 MG-UNIT tablet Take 1 tablet by mouth 2 (two) times daily. Patient taking differently: Take 1 tablet by mouth every other day.  02/06/17  Yes Aviva Signs, MD   levothyroxine (SYNTHROID, LEVOTHROID) 125 MCG tablet Take 1 tablet (125 mcg total) by mouth daily before breakfast. 10/09/17  Yes Nida, Marella Chimes, MD  losartan (COZAAR) 50 MG tablet Take 50 mg by mouth daily.  11/25/16  Yes [provider]  omeprazole (PRILOSEC) 20 MG capsule Take 20 mg by mouth daily.   Yes [provider]  polyethylene glycol-electrolytes (TRILYTE) 420 g solution Take 4,000 mLs by mouth as directed. 09/17/17  Yes Mahala Menghini, PA-C  hydrochlorothiazide (HYDRODIURIL) 25 MG tablet Take 25 mg by mouth daily.    [provider]  metFORMIN (GLUCOPHAGE) 500 MG tablet Take 500 mg by mouth daily with breakfast.    [provider]    Allergies as of 09/17/2017  . (No Known Allergies)    Family History  Problem Relation Age of Onset  . Colon cancer Neg Hx     Social History   Socioeconomic History  . Marital status: Divorced    Spouse name: Not on file  . Number of children: Not on file  . Years of education: Not on file  . Highest education level: Not on file  Social Needs  . Financial resource strain: Not on file  . Food insecurity - worry: Not on file  . Food insecurity - inability: Not on file  . Transportation needs - medical: Not on file  . Transportation needs - non-medical: Not on file  Occupational  History  . Not on file  Tobacco Use  . Smoking status: Former Smoker    Packs/day: 0.25    Years: 2.00    Pack years: 0.50    Types: Cigarettes    Last attempt to quit: 01/31/2010    Years since quitting: 7.7  . Smokeless tobacco: Never Used  Substance and Sexual Activity  . Alcohol use: No    Alcohol/week: 0.0 oz  . Drug use: No  . Sexual activity: Not Currently    Birth control/protection: Surgical  Other Topics Concern  . Not on file  Social History Narrative  . Not on file    Review of Systems: See HPI, otherwise negative ROS   Physical Exam: BP (!) 148/95   Pulse 92   Temp 98.5 F (36.9 C) (Oral)    Resp 17   Ht 5\' 3"  (1.6 m)   Wt 162 lb (73.5 kg)   SpO2 100%   BMI 28.70 kg/m  General:   Alert,  pleasant and cooperative in NAD Head:  Normocephalic and atraumatic. Neck:  Supple; Lungs:  Clear throughout to auscultation.    Heart:  Regular rate and rhythm. Abdomen:  Soft, nontender and nondistended. Normal bowel sounds, without guarding, and without rebound.   Neurologic:  Alert and  oriented x4;  grossly normal neurologically.  Impression/Plan:      PERSONAL HISTORY OF POLYPS.  Plan:  1. TCS TODAY DISCUSSED PROCEDURE, BENEFITS, & RISKS: < 1% chance of medication reaction, bleeding, perforation, or rupture of spleen/liver.

## 2017-10-24 NOTE — Discharge Instructions (Signed)
You had 4 polyps removed. ONE WAS LARGER AND I TATTOOED THE BASE. I PLACED ONE CLIP TO PREVENT BLEEDING IN 7-10 DAYS. You have  MODERATE EXTERNAL AND internal hemorrhoids.   DRINK WATER TO KEEP YOUR URINE LIGHT YELLOW.  FOLLOW A HIGH FIBER DIET. AVOID ITEMS THAT CAUSE BLOATING & GAS. SEE INFO BELOW.  YOUR BIOPSY RESULTS WILL BE AVAILABLE IN MY CHART AFTER MAR 19 AND MY OFFICE WILL CONTACT YOU IN 10-14 DAYS WITH YOUR RESULTS.   Next colonoscopy in 1-3 years.    Colonoscopy Care After Read the instructions outlined below and refer to this sheet in the next week. These discharge instructions provide you with general information on caring for yourself after you leave the hospital. While your treatment has been planned according to the most current medical practices available, unavoidable complications occasionally occur. If you have any problems or questions after discharge, call DR. Lawson Mahone, [phone].  ACTIVITY  You may resume your regular activity, but move at a slower pace for the next 24 hours.   Take frequent rest periods for the next 24 hours.   Walking will help get rid of the air and reduce the bloated feeling in your belly (abdomen).   No driving for 24 hours (because of the medicine (anesthesia) used during the test).   You may shower.   Do not sign any important legal documents or operate any machinery for 24 hours (because of the anesthesia used during the test).    NUTRITION  Drink plenty of fluids.   You may resume your normal diet as instructed by your doctor.   Begin with a light meal and progress to your normal diet. Heavy or fried foods are harder to digest and may make you feel sick to your stomach (nauseated).   Avoid alcoholic beverages for 24 hours or as instructed.    MEDICATIONS  You may resume your normal medications.   WHAT YOU CAN EXPECT TODAY  Some feelings of bloating in the abdomen.   Passage of more gas than usual.   Spotting of blood  in your stool or on the toilet paper  .  IF YOU HAD POLYPS REMOVED DURING THE COLONOSCOPY:  Eat a soft diet IF YOU HAVE NAUSEA, BLOATING, ABDOMINAL PAIN, OR VOMITING.    FINDING OUT THE RESULTS OF YOUR TEST Not all test results are available during your visit. DR. Oneida Alar WILL CALL YOU WITHIN 14 DAYS OF YOUR PROCEDUE WITH YOUR RESULTS. Do not assume everything is normal if you have not heard from DR. Caelum Federici, CALL HER OFFICE AT [phone].  SEEK IMMEDIATE MEDICAL ATTENTION AND CALL THE OFFICE: [phone] IF:  You have more than a spotting of blood in your stool.   Your belly is swollen (abdominal distention).   You are nauseated or vomiting.   You have a temperature over 101F.   You have abdominal pain or discomfort that is severe or gets worse throughout the day.   High-Fiber Diet A high-fiber diet changes your normal diet to include more whole grains, legumes, fruits, and vegetables. Changes in the diet involve replacing refined carbohydrates with unrefined foods. The calorie level of the diet is essentially unchanged. The Dietary Reference Intake (recommended amount) for adult males is 38 grams per day. For adult females, it is 25 grams per day. Pregnant and lactating women should consume 28 grams of fiber per day. Fiber is the intact part of a plant that is not broken down during digestion. Functional fiber is fiber that has  been isolated from the plant to provide a beneficial effect in the body. PURPOSE  Increase stool bulk.   Ease and regulate bowel movements.   Lower cholesterol.   REDUCE RISK OF COLON CANCER  INDICATIONS THAT YOU NEED MORE FIBER  Constipation and hemorrhoids.   Uncomplicated diverticulosis (intestine condition) and irritable bowel syndrome.   Weight management.   As a protective measure against hardening of the arteries (atherosclerosis), diabetes, and cancer.   GUIDELINES FOR INCREASING FIBER IN THE DIET  Start adding fiber to the diet  slowly. A gradual increase of about 5 more grams (2 slices of whole-wheat bread, 2 servings of most fruits or vegetables, or 1 bowl of high-fiber cereal) per day is best. Too rapid an increase in fiber may result in constipation, flatulence, and bloating.   Drink enough water and fluids to keep your urine clear or pale yellow. Water, juice, or caffeine-free drinks are recommended. Not drinking enough fluid may cause constipation.   Eat a variety of high-fiber foods rather than one type of fiber.   Try to increase your intake of fiber through using high-fiber foods rather than fiber pills or supplements that contain small amounts of fiber.   The goal is to change the types of food eaten. Do not supplement your present diet with high-fiber foods, but replace foods in your present diet.   INCLUDE A VARIETY OF FIBER SOURCES  Replace refined and processed grains with whole grains, canned fruits with fresh fruits, and incorporate other fiber sources. White rice, white breads, and most bakery goods contain little or no fiber.   Brown whole-grain rice, buckwheat oats, and many fruits and vegetables are all good sources of fiber. These include: broccoli, Brussels sprouts, cabbage, cauliflower, beets, sweet potatoes, white potatoes (skin on), carrots, tomatoes, eggplant, squash, berries, fresh fruits, and dried fruits.   Cereals appear to be the richest source of fiber. Cereal fiber is found in whole grains and bran. Bran is the fiber-rich outer coat of cereal grain, which is largely removed in refining. In whole-grain cereals, the bran remains. In breakfast cereals, the largest amount of fiber is found in those with "bran" in their names. The fiber content is sometimes indicated on the label.   You may need to include additional fruits and vegetables each day.   In baking, for 1 cup white flour, you may use the following substitutions:   1 cup whole-wheat flour minus 2 tablespoons.   1/2 cup white  flour plus 1/2 cup whole-wheat flour.   Polyps, Colon  A polyp is extra tissue that grows inside your body. Colon polyps grow in the large intestine. The large intestine, also called the colon, is part of your digestive system. It is a long, hollow tube at the end of your digestive tract where your body makes and stores stool. Most polyps are not dangerous. They are benign. This means they are not cancerous. But over time, some types of polyps can turn into cancer. Polyps that are smaller than a pea are usually not harmful. But larger polyps could someday become or may already be cancerous. To be safe, doctors remove all polyps and test them.   WHO GETS POLYPS? Anyone can get polyps, but certain people are more likely than others. You may have a greater chance of getting polyps if:  You are over 50.   You have had polyps before.   Someone in your family has had polyps.   Someone in your family has had cancer  of the large intestine.   Find out if someone in your family has had polyps. You may also be more likely to get polyps if you:   Eat a lot of fatty foods   Smoke   Drink alcohol   Do not exercise  Eat too much   PREVENTION There is not one sure way to prevent polyps. You might be able to lower your risk of getting them if you:  Eat more fruits and vegetables and less fatty food.   Do not smoke.   Avoid alcohol.   Exercise every day.   Lose weight if you are overweight.   Eating more calcium and folate can also lower your risk of getting polyps. Some foods that are rich in calcium are milk, cheese, and broccoli. Some foods that are rich in folate are chickpeas, kidney beans, and spinach.   Hemorrhoids Hemorrhoids are dilated (enlarged) veins around the rectum. Sometimes clots will form in the veins. This makes them swollen and painful. These are called thrombosed hemorrhoids. Causes of hemorrhoids include:  Constipation.   Straining to have a bowel movement.   HEAVY  LIFTING  HOME CARE INSTRUCTIONS  Eat a well balanced diet and drink 6 to 8 glasses of water every day to avoid constipation. You may also use a bulk laxative.   Avoid straining to have bowel movements.   Keep anal area dry and clean.   Do not use a donut shaped pillow or sit on the toilet for long periods. This increases blood pooling and pain.   Move your bowels when your body has the urge; this will require less straining and will decrease pain and pressure.

## 2017-10-24 NOTE — Op Note (Signed)
[facility] Patient Name: Kristina Huang Procedure Date: 10/24/2017 10:19 AM MRN: [id number] Date of Birth: Aug 17, 1945 Attending MD: Barney Drain MD, MD CSN: [id number] Age: 72 Admit Type: Outpatient Procedure:                Colonoscopy-COLD SNARE/SNARE CAUTERY POLYPECTOMY,                            EMR, & SQ INJECTION Indications:              Personal history of colonic polyps Providers:                Barney Drain MD, MD, Janeece Riggers, RN, Randa Spike, Technician Referring MD:             Abran Richard Medicines:                Meperidine 75 mg IV, Midazolam 4 mg IV Complications:            No immediate complications. Estimated Blood Loss:     Estimated blood loss was minimal. Procedure:                Pre-Anesthesia Assessment:                           - Prior to the procedure, a History and Physical                            was performed, and patient medications and                            allergies were reviewed. The patient's tolerance of                            previous anesthesia was also reviewed. The risks                            and benefits of the procedure and the sedation                            options and risks were discussed with the patient.                            All questions were answered, and informed consent                            was obtained. Prior Anticoagulants: The patient has                            taken aspirin, last dose was 7 days prior to                            procedure. ASA Grade Assessment: II - A patient  with mild systemic disease. After reviewing the                            risks and benefits, the patient was deemed in                            satisfactory condition to undergo the procedure.                            After obtaining informed consent, the colonoscope                            was passed under direct vision. Throughout the                       procedure, the patient's blood pressure, pulse, and                            oxygen saturations were monitored continuously. The                            EC-[id number] ([id number]) scope was introduced through                            the anus and advanced to the the cecum, identified                            by appendiceal orifice and ileocecal valve. The                            colonoscopy was somewhat difficult due to a                            tortuous colon. Successful completion of the                            procedure was aided by straightening and shortening                            the scope to obtain bowel loop reduction and                            COLOWRAP. The patient tolerated the procedure well.                            The quality of the bowel preparation was excellent.                            The ileocecal valve, appendiceal orifice, and                            rectum were photographed. Scope In: 9:43:45 AM Scope Out: 10:06:50 AM Scope Withdrawal Time: 0 hours 21 minutes 7 seconds  Total Procedure Duration: 0 hours 23 minutes  5 seconds  Findings:      A 15 mm polyp was found in the mid ascending colon. The polyp was       carpet-like, multi-lobulated and sessile. Preparations were made for       mucosal resection. 2 CC ELEVIEW was injected to raise the lesion. Snare       mucosal resection was performed. Resection and retrieval were complete.       To prevent bleeding after the polypectomy, one hemostatic clip was       successfully placed (MR conditional). There was no bleeding at the end       of the procedure. Area was tattooed with an injection of 2 mL of Spot       (carbon black).      A 4 mm polyp was found in the cecum. The polyp was sessile. The polyp       was removed with a cold snare. Resection and retrieval were complete.      Two sessile polyps were found in the proximal ascending colon and mid       ascending colon.  The polyps were 5 to 6 mm in size. These polyps were       removed with a hot snare. Resection and retrieval were complete.      External and internal hemorrhoids were found during retroflexion. The       hemorrhoids were moderate. Impression:               - One 15 mm polyp in the mid ascending colon,                            removed with mucosal resection. Resected and                            retrieved. Clip (MR conditional) was placed.                            Tattooed.                           - One 4 mm polyp in the cecum, removed with a cold                            snare. Resected and retrieved.                           - Two 5 to 6 mm polyps in the proximal ascending                            colon and in the mid ascending colon, removed with                            a hot snare. Resected and retrieved.                           - External and internal hemorrhoids. Moderate Sedation:      Moderate (conscious) sedation was administered by the endoscopy nurse       and supervised by the endoscopist. The following parameters  were       monitored: oxygen saturation, heart rate, blood pressure, and response       to care. Total physician intraservice time was 41 minutes. Recommendation:           - Repeat colonoscopy 1-3 YEARS for surveillance.                           - High fiber diet.                           - Continue present medications.                           - Await pathology results.                           - Patient has a contact number available for                            emergencies. The signs and symptoms of potential                            delayed complications were discussed with the                            patient. Return to normal activities tomorrow.                            Written discharge instructions were provided to the                            patient. Procedure Code(s):        --- Professional ---                            [phone], 68, Colonoscopy, flexible; with endoscopic                            mucosal resection                           [phone], Colonoscopy, flexible; with removal of                            tumor(s), polyp(s), or other lesion(s) by snare                            technique                           99152, Moderate sedation services provided by the                            same physician or other qualified health care                            professional performing the  diagnostic or                            therapeutic service that the sedation supports,                            requiring the presence of an independent trained                            observer to assist in the monitoring of the                            patient's level of consciousness and physiological                            status; initial 15 minutes of intraservice time,                            patient age 90 years or older                           [phone], Moderate sedation services; each additional                            15 minutes intraservice time                           [phone], Moderate sedation services; each additional                            15 minutes intraservice time Diagnosis Code(s):        --- Professional ---                           D12.2, Benign neoplasm of ascending colon                           D12.0, Benign neoplasm of cecum                           K64.8, Other hemorrhoids                           Z86.010, Personal history of colonic polyps CPT copyright 2016 [employer]. All rights reserved. The codes documented in this report are preliminary and upon coder review may  be revised to meet current compliance requirements. Barney Drain, MD Barney Drain MD, MD 10/24/2017 10:31:05 AM This report has been signed electronically. Number of Addenda: 0

## 2017-10-27 ENCOUNTER — Telehealth: Payer: Self-pay | Admitting: Gastroenterology

## 2017-10-27 NOTE — Telephone Encounter (Signed)
LMOM to call.

## 2017-10-27 NOTE — Telephone Encounter (Signed)
ONE SERRATED ADENOMA AND THREE SIMPLE ADENOMAS REMOVED. NEXT COLONOSCOPY IN 3 YEARS.

## 2017-10-28 ENCOUNTER — Telehealth: Payer: Self-pay | Admitting: Gastroenterology

## 2017-10-28 NOTE — Telephone Encounter (Signed)
PT is aware.

## 2017-10-28 NOTE — Telephone Encounter (Signed)
Pt was returning a call from DS. Please call her back at [phone]

## 2017-10-28 NOTE — Telephone Encounter (Signed)
See result note. Pt is aware.  

## 2017-10-30 ENCOUNTER — Encounter (HOSPITAL_COMMUNITY): Payer: Self-pay | Admitting: Gastroenterology

## 2017-11-21 LAB — T4, FREE: FREE T4: 1.6 ng/dL (ref 0.8–1.8)

## 2017-11-21 LAB — TSH: TSH: 0.12 m[IU]/L — AB (ref 0.40–4.50)

## 2017-11-27 ENCOUNTER — Ambulatory Visit (INDEPENDENT_AMBULATORY_CARE_PROVIDER_SITE_OTHER): Payer: Medicare PPO | Admitting: "Endocrinology

## 2017-11-27 ENCOUNTER — Encounter: Payer: Self-pay | Admitting: "Endocrinology

## 2017-11-27 VITALS — BP 135/87 | HR 93 | Ht 63.0 in | Wt 162.0 lb

## 2017-11-27 DIAGNOSIS — C73 Malignant neoplasm of thyroid gland: Secondary | ICD-10-CM

## 2017-11-27 DIAGNOSIS — E890 Postprocedural hypothyroidism: Secondary | ICD-10-CM | POA: Diagnosis not present

## 2017-11-27 NOTE — Progress Notes (Signed)
Subjective:    Patient ID: Kristina Huang, female    DOB: Dec 19, 1945, PCP Abran Richard, MD   Past Medical History:  Diagnosis Date  . Acid reflux   . Arthritis   . Cancer ([place])    Thyroid  . Diabetes mellitus without complication (HCC)    fasting 100-130  . Family history of adverse reaction to anesthesia   . Hypercholesterolemia   . Hypertension   . 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 BACK SURGERY     spinal fusion  . BUNIONECTOMY Bilateral   . CHOLECYSTECTOMY     Dr. Irving Shows  . COLONOSCOPY  2009   Dr. Oneida Alar: moderate internal hemorrhoids, frequent sigmoid colon diverticula, polyps X 3, one large adenoma otherwise hyperplastic  . COLONOSCOPY N/A 09/09/2014   Procedure: COLONOSCOPY;  Surgeon: Danie Binder, MD;  [facility]: [facility];  Service: Endoscopy;  Laterality: N/A;  200 - moved to 1/29 @ 12:45  . COLONOSCOPY N/A 10/24/2017   Procedure: COLONOSCOPY;  Surgeon: Danie Binder, MD;  Location: [facility];  Service: Endoscopy;  Laterality: N/A;  9:30  . ESOPHAGOGASTRODUODENOSCOPY N/A 09/09/2014   Procedure: ESOPHAGOGASTRODUODENOSCOPY (EGD);  Surgeon: Danie Binder, MD;  [facility];  Service: Endoscopy;  Laterality: N/A;  . THYROIDECTOMY N/A 02/05/2017   Procedure: THYROIDECTOMY;  Surgeon: Aviva Signs, MD;  Location: [facility];  Service: General;  Laterality: N/A;  . TUBAL LIGATION     Social History   Socioeconomic History  . Marital status: Divorced    Spouse name: Not on file  . Number of children: Not on file  . Years of education: Not on file  . Highest education level: Not on file  Occupational History  . Not on file  Social Needs  . Financial resource strain: Not on file  . Food insecurity:    Worry: Not on file    Inability: Not on file  . Transportation needs:    Medical: Not on file    Non-medical: Not on file  Tobacco Use  . Smoking status:  Former Smoker    Packs/day: 0.25    Years: 2.00    Pack years: 0.50    Types: Cigarettes    Last attempt to quit: 01/31/2010    Years since quitting: 7.8  . Smokeless tobacco: Never Used  Substance and Sexual Activity  . Alcohol use: No    Alcohol/week: 0.0 oz  . Drug use: No  . Sexual activity: Not Currently    Birth control/protection: Surgical  Lifestyle  . Physical activity:    Days per week: Not on file    Minutes per session: Not on file  . Stress: Not on file  Relationships  . Social connections:    Talks on phone: Not on file    Gets together: Not on file    Attends religious service: Not on file    Active member of club or organization: Not on file    Attends meetings of clubs or organizations: Not on file    Relationship status: Not on file  Other Topics Concern  . Not on file  Social History Narrative  . Not on file   Outpatient Encounter Medications as of 11/27/2017  Medication Sig  . acetaminophen (TYLENOL) 500 MG tablet Take 1,500 mg by mouth every 6 (six) hours as needed for moderate pain or headache.  Marland Kitchen aspirin  EC 81 MG tablet Take 81 mg by mouth at bedtime.   . calcium-vitamin D (OSCAL WITH D) 500-200 MG-UNIT tablet Take 1 tablet by mouth 2 (two) times daily. (Patient taking differently: Take 1 tablet by mouth every other day. )  . hydrochlorothiazide (HYDRODIURIL) 25 MG tablet Take 25 mg by mouth daily.  Marland Kitchen levothyroxine (SYNTHROID, LEVOTHROID) 125 MCG tablet Take 1 tablet (125 mcg total) by mouth daily before breakfast.  . losartan (COZAAR) 50 MG tablet Take 50 mg by mouth daily.   . metFORMIN (GLUCOPHAGE) 500 MG tablet Take 500 mg by mouth daily with breakfast.  . omeprazole (PRILOSEC) 20 MG capsule Take 20 mg by mouth daily.  . polyethylene glycol-electrolytes (TRILYTE) 420 g solution Take 4,000 mLs by mouth as directed.   No facility-administered encounter medications on file as of 11/27/2017.    ALLERGIES: No Known Allergies  VACCINATION  STATUS:  There is no immunization history on file for this patient.  HPI 72 year old female patient with medical history as above. She is being seen in follow up  for locally invasive multifocal papillary thyroid cancer, status post thyroidectomy and post thyroidectomy hypothyroidism. - She underwent total thyroidectomy on 02/05/2017 which revealed a  papillary thyroid carcinoma on the right lower thyroid-0.5 cm foci with local extrathyroidal extension and another 0.2 cm separate tumor in the right lobe. The left lobe was said to be consistent with hyperplastic nodule. - She underwent I-131 thyroid remnant ablation on 04/15/2017 followed by post therapy whole body scan. -  On the scan, she was found to have foci of abnormal radioiodine accumulation in the cervical region, suggestive of thyroid remnant or metastatic cervical lymph node. -She missed her appointment for thyroid/neck ultrasound. -She was initiated on levothyroxine 112 g by mouth every morning. She reports compliance to this medication.  - She was known to have multinodular goiter for at least 7 years. - Series of thyroid/neck ultrasound results showed that the thyroid continued to grow over time as well as increasing nodule size in bilateral lobes. - she also had  Feelings of choking sensations on and off and shortness of breath intermittently. - She denies any family history of thyroid cancer, however one of her sisters have hyperthyroidism status post treatment with what appears to be radioactive iodine therapy.  Review of Systems  Constitutional: + no weight change , - fatigue, no subjective hyperthermia, no subjective hypothermia Eyes: no blurry vision, no xerophthalmia ENT: no sore throat, + thyroidectomy, - dysphagia/odynophagia, no hoarseness Cardiovascular: no Chest Pain, no Shortness of Breath, no palpitations, no leg swelling Respiratory: no cough, no SOB Gastrointestinal: no nausea, no vomiting, no diarrhea.   Musculoskeletal: no muscle/joint aches Skin: no rashes Neurological: no tremors, no numbness, no tingling, no dizziness Psychiatric: no depression, no anxiety  Objective:    BP 135/87   Pulse 93   Ht 5\' 3"  (1.6 m)   Wt 162 lb (73.5 kg)   BMI 28.70 kg/m   Wt Readings from Last 3 Encounters:  11/27/17 162 lb (73.5 kg)  10/24/17 162 lb (73.5 kg)  05/26/17 162 lb (73.5 kg)    Physical Exam  Constitutional: + over weight for height, not in acute distress, normal state of mind Eyes: PERRLA, EOMI, no exophthalmos ENT: moist mucous membranes, + well-healed  post thyroidectomy scar on anterior lower neck , no cervical lymphadenopathy  Musculoskeletal: no gross deformities, strength intact in all four extremities Skin: moist, warm, no rashes Neurological: no tremor with outstretched hands, Deep tendon reflexes  normal in all four extremities.  CMP ( most recent) CMP     Component Value Date/Time   NA 136 02/06/2017 0444   K 3.9 02/06/2017 0444   CL 103 02/06/2017 0444   CO2 26 02/06/2017 0444   GLUCOSE 153 (H) 02/06/2017 0444   BUN 10 02/06/2017 0444   CREATININE 0.76 02/06/2017 0444   CALCIUM 9.2 02/06/2017 0444   PROT 6.6 02/06/2017 0444   ALBUMIN 3.2 (L) 02/06/2017 0444   AST 18 02/06/2017 0444   ALT 13 (L) 02/06/2017 0444   ALKPHOS 68 02/06/2017 0444   BILITOT 0.8 02/06/2017 0444   GFRNONAA >60 02/06/2017 0444   GFRAA >60 02/06/2017 0444   Results for DIYA, GERVASI (MRN [id number]) as of 11/27/2017 11:49  Ref. Range 04/29/2017 11:36 11/21/2017 09:20  TSH Latest Ref Range: 0.40 - 4.50 Huang/L 2.96 0.12 (L)  T4,Free(Direct) Latest Ref Range: 0.8 - 1.8 ng/dL 1.4 1.6  Thyroglobulin Latest Units: ng/mL 1.1 (L)   Thyroglobulin Ab Latest Ref Range: < or = 1 IU/mL <1        Fine-needle aspiration cytology of the thyroid on 10/23/2016 showed: THYROID, FINE NEEDLE ASPIRATION, LEFT (SPECIMEN 1 OF 1, COLLECTED 10/23/16): CONSISTENT WITH BENIGN FOLLICULAR NODULE (BETHESDA  CATEGORY II).  02/05/2017: She underwent total thyroidectomy: 02/05/2017: INAL DIAGNOSIS Diagnosis 1. Thyroid, lobectomy, left - NODULAR HYPERPLASIA (MULTINODULAR GOITER). 2. Thyroid, lobectomy, right - PAPILLARY THYROID CARCINOMA, TWO TUMORS, 0.5 AND 0.2 CM. - PAPILLARY CARCINOMA FOCALLY EXTENDS INTO THE PERITHYROIDAL CONNECTIVE TISSUE. - HURTHLE CELL ADENOMATOUS NODULE. - NODULAR HYPERPLASIA.  TNM code: pT1a , pNX.   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  No distant sites of iodine-avid metastatic thyroid cancer Identified.  Surveillance thyroid/neck ultrasound from 05/19/2017 showed:  Surgically absent thyroid lobes. A single morphologically unremarkable 0.8 cm left cervical lymph node is noted. No pathologic adenopathy.  IMPRESSION: No evidence of residual/ recurrent tissue post thyroidectomy  Assessment & Plan:   1. 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 Her surgical sample unexpectedly revealed occult malignancy on the right lobe of the thyroid, 0.5 cm  with focal extrathyroidal extension and 0.2 cm foci.  TNM code: pT1a , pNX. - She is status post total thyroidectomy and I-131 thyroid remnant ablation on 04/15/2017.  - Thyroglobulin level and thyroglobulin antibodies are  low  on the day of or body scan. 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  No distant sites of iodine-avid metastatic thyroid cancer Identified.  - Surveillance  thyroid/neck ultrasound - is unremarkable except for nonspecific 0.8 cm lymph node on the left cervical region.  - She will not require any intervention at this time.  She missed  her ultrasound appointment before this visit.  She agrees with plan to get surveillance thyroid/neck ultrasound in 3 months with office visit.      2. Postsurgical hypothyroidism  -Her thyroid function tests are consistent with appropriately suppressed TSH at 0.1 2 along with free T4 1.6. - The goal of therapy in thyroid cancer suppressive therapy is to keep TSH between 0.1-0.5 . - I advised her to remain on the same dose of levothyroxine 125 mcg p.o. nightly.  - We discussed  about correct intake of levothyroxine, at fasting, with water, separated by at least 30 minutes from breakfast, and separated by more than 4 hours from calcium, iron, multivitamins, acid reflux medications (PPIs). -Patient is made aware of the fact that thyroid hormone replacement is needed for life, dose to be adjusted by periodic monitoring of thyroid function tests.  - I advised patient to maintain close follow up with Abran Richard, MD for primary care needs.  - Time spent with the patient: 25 min, of which >50% was spent in reviewing her  current and  previous labs, previous treatments, and medications doses and developing a plan for long-term care.  Kristina Huang participated in the discussions, expressed understanding, and voiced agreement with the above plans.  All questions were answered to her satisfaction. she is encouraged to contact clinic should she have any questions or concerns prior to her return visit.   Follow up plan: Return in about 3 months (around 02/26/2018) for follow up with pre-visit labs, Thyroid / Neck Ultrasound.  Glade Lloyd, MD Phone: [phone]  Fax: [phone]  This note was partially dictated with voice recognition software. Similar sounding words can be transcribed inadequately or may not  be corrected upon review.  11/27/2017, 11:45 AM

## 2018-02-23 ENCOUNTER — Ambulatory Visit (HOSPITAL_COMMUNITY)
Admission: RE | Admit: 2018-02-23 | Discharge: 2018-02-23 | Disposition: A | Discharge status: Home / Self Care | Payer: Medicare PPO | Source: Ambulatory Visit | Attending: "Endocrinology | Admitting: "Endocrinology

## 2018-02-23 DIAGNOSIS — C73 Malignant neoplasm of thyroid gland: Secondary | ICD-10-CM

## 2018-02-24 ENCOUNTER — Other Ambulatory Visit: Payer: Self-pay | Admitting: "Endocrinology

## 2018-02-27 ENCOUNTER — Ambulatory Visit: Payer: Medicare PPO | Admitting: "Endocrinology

## 2018-03-10 ENCOUNTER — Ambulatory Visit: Payer: Medicare PPO | Admitting: "Endocrinology

## 2018-03-26 LAB — COMPLETE METABOLIC PANEL WITH GFR
AG Ratio: 1.3 (calc) (ref 1.0–2.5)
ALBUMIN MSPROF: 4.1 g/dL (ref 3.6–5.1)
ALKALINE PHOSPHATASE (APISO): 87 U/L (ref 33–130)
ALT: 15 U/L (ref 6–29)
AST: 16 U/L (ref 10–35)
BUN: 13 mg/dL (ref 7–25)
CALCIUM: 9.2 mg/dL (ref 8.6–10.4)
CO2: 26 mmol/L (ref 20–32)
CREATININE: 0.8 mg/dL (ref 0.60–0.93)
Chloride: 107 mmol/L (ref 98–110)
GFR, EST AFRICAN AMERICAN: 85 mL/min/{1.73_m2} (ref >=60)
GFR, EST NON AFRICAN AMERICAN: 74 mL/min/{1.73_m2} (ref >=60)
GLOBULIN: 3.1 g/dL (ref 1.9–3.7)
Glucose, Bld: 86 mg/dL (ref 65–139)
Potassium: 4 mmol/L (ref 3.5–5.3)
SODIUM: 142 mmol/L (ref 135–146)
TOTAL PROTEIN: 7.2 g/dL (ref 6.1–8.1)
Total Bilirubin: 0.4 mg/dL (ref 0.2–1.2)

## 2018-03-26 LAB — T3, FREE: T3, Free: 2 pg/mL — ABNORMAL LOW (ref 2.3–4.2)

## 2018-03-26 LAB — TSH: TSH: 0.11 m[IU]/L — AB (ref 0.40–4.50)

## 2018-03-26 LAB — T4, FREE: Free T4: 1.7 ng/dL (ref 0.8–1.8)

## 2018-04-02 ENCOUNTER — Encounter: Payer: Self-pay | Admitting: "Endocrinology

## 2018-04-02 ENCOUNTER — Ambulatory Visit (INDEPENDENT_AMBULATORY_CARE_PROVIDER_SITE_OTHER): Payer: Medicare PPO | Admitting: "Endocrinology

## 2018-04-02 VITALS — BP 131/86 | HR 88 | Ht 63.0 in | Wt 161.0 lb

## 2018-04-02 DIAGNOSIS — C73 Malignant neoplasm of thyroid gland: Secondary | ICD-10-CM | POA: Diagnosis not present

## 2018-04-02 DIAGNOSIS — E890 Postprocedural hypothyroidism: Secondary | ICD-10-CM | POA: Diagnosis not present

## 2018-04-02 MED ORDER — LEVOTHYROXINE SODIUM 112 MCG PO TABS: 112.0000 ug | ORAL_TABLET | Freq: Every day | ORAL | 4 refills | Status: DC

## 2018-04-02 NOTE — Progress Notes (Signed)
Endocrinology follow-up note    Subjective:    Patient ID: Kristina Huang, female    DOB: 21-May-1946, PCP Abran Richard, MD   Past Medical History:  Diagnosis Date  . Acid reflux   . Arthritis   . Cancer ([place])    Thyroid  . Diabetes mellitus without complication (HCC)    fasting 100-130  . Family history of adverse reaction to anesthesia   . Hypercholesterolemia   . Hypertension   . 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 BACK SURGERY     spinal fusion  . BUNIONECTOMY Bilateral   . CHOLECYSTECTOMY     Dr. Irving Shows  . COLONOSCOPY  2009   Dr. Oneida Alar: moderate internal hemorrhoids, frequent sigmoid colon diverticula, polyps X 3, one large adenoma otherwise hyperplastic  . COLONOSCOPY N/A 09/09/2014   Procedure: COLONOSCOPY;  Surgeon: Danie Binder, MD;  [facility];  Service: Endoscopy;  Laterality: N/A;  200 - moved to 1/29 @ 12:45  . COLONOSCOPY N/A 10/24/2017   Procedure: COLONOSCOPY;  Surgeon: Danie Binder, MD;  [facility];  Service: Endoscopy;  Laterality: N/A;  9:30  . ESOPHAGOGASTRODUODENOSCOPY N/A 09/09/2014   Procedure: ESOPHAGOGASTRODUODENOSCOPY (EGD);  Surgeon: Danie Binder, MD;  [facility];  Service: Endoscopy;  Laterality: N/A;  . THYROIDECTOMY N/A 02/05/2017   Procedure: THYROIDECTOMY;  Surgeon: Aviva Signs, MD;  Location: [facility];  Service: General;  Laterality: N/A;  . TUBAL LIGATION     Social History   Socioeconomic History  . Marital status: Divorced    Spouse name: Not on file  . Number of children: Not on file  . Years of education: Not on file  . Highest education level: Not on file  Occupational History  . Not on file  Social Needs  . Financial resource strain: Not on file  . Food insecurity:    Worry: Not on file    Inability: Not on file  . Transportation needs:    Medical: Not on file    Non-medical: Not on file   Tobacco Use  . Smoking status: Former Smoker    Packs/day: 0.25    Years: 2.00    Pack years: 0.50    Types: Cigarettes    Last attempt to quit: 01/31/2010    Years since quitting: 8.1  . Smokeless tobacco: Never Used  Substance and Sexual Activity  . Alcohol use: No    Alcohol/week: 0.0 standard drinks  . Drug use: No  . Sexual activity: Not Currently    Birth control/protection: Surgical  Lifestyle  . Physical activity:    Days per week: Not on file    Minutes per session: Not on file  . Stress: Not on file  Relationships  . Social connections:    Talks on phone: Not on file    Gets together: Not on file    Attends religious service: Not on file    Active member of club or organization: Not on file    Attends meetings of clubs or organizations: Not on file    Relationship status: Not on file  Other Topics Concern  . Not on file  Social History Narrative  . Not on file   Outpatient Encounter Medications as of 04/02/2018  Medication Sig  . acetaminophen (TYLENOL) 500 MG tablet Take 1,500 mg by mouth every 6 (six) hours as needed for moderate pain  or headache.  Marland Kitchen aspirin EC 81 MG tablet Take 81 mg by mouth at bedtime.   . calcium-vitamin D (OSCAL WITH D) 500-200 MG-UNIT tablet Take 1 tablet by mouth 2 (two) times daily. (Patient taking differently: Take 1 tablet by mouth every other day. )  . diclofenac (VOLTAREN) 75 MG EC tablet 2 (two) times daily.  . hydrochlorothiazide (HYDRODIURIL) 25 MG tablet Take 25 mg by mouth daily.  Marland Kitchen levothyroxine (SYNTHROID, LEVOTHROID) 112 MCG tablet Take 1 tablet (112 mcg total) by mouth daily before breakfast.  . losartan (COZAAR) 50 MG tablet Take 50 mg by mouth daily.   . metFORMIN (GLUCOPHAGE) 500 MG tablet Take 500 mg by mouth daily with breakfast.  . omeprazole (PRILOSEC) 20 MG capsule Take 20 mg by mouth daily.  . polyethylene glycol-electrolytes (TRILYTE) 420 g solution Take 4,000 mLs by mouth as directed.  . [DISCONTINUED]  levothyroxine (SYNTHROID, LEVOTHROID) 125 MCG tablet Take 1 tablet (125 mcg total) by mouth daily before breakfast.  . [DISCONTINUED] levothyroxine (SYNTHROID, LEVOTHROID) 125 MCG tablet TAKE 1 TABLET BY MOUTH ONCE DAILY BEFORE BREAKFAST   No facility-administered encounter medications on file as of 04/02/2018.    ALLERGIES: No Known Allergies  VACCINATION STATUS:  There is no immunization history on file for this patient.  HPI 72 year old female patient with medical history as above. She is being seen in follow up  for locally invasive multifocal papillary thyroid cancer, status post thyroidectomy and post thyroidectomy hypothyroidism. - She underwent total thyroidectomy on 02/05/2017 which revealed a  papillary thyroid carcinoma on the right lower thyroid-0.5 cm foci with local extrathyroidal extension and another 0.2 cm separate tumor in the right lobe. The left lobe was said to be consistent with hyperplastic nodule. - She underwent I-131 thyroid remnant ablation on 04/15/2017 followed by post therapy whole body scan. -  On the scan, she was found to have foci of abnormal radioiodine accumulation in the cervical region, suggestive of thyroid remnant or metastatic cervical lymph node.  -Her previsit thyroid/neck ultrasound confirms decrease in size of this lymph node to 0.3 cm and no new concerns. -She is currently on levothyroxine 125 mcg p.o. daily before breakfast.  She reports compliance to this medication.  - She was known to have multinodular goiter for at least 7 years. - Series of thyroid/neck ultrasound results showed that the thyroid continued to grow over time as well as increasing nodule size in bilateral lobes. - she also had  Feelings of choking sensations on and off and shortness of breath intermittently-symptoms have significantly improved since her surgery. - She denies any family history of thyroid cancer, however one of her sisters have hyperthyroidism status post  treatment with what appears to be radioactive iodine therapy.  Review of Systems  Constitutional: + Steady weight, - fatigue, no subjective hyperthermia, no subjective hypothermia Eyes: no blurry vision, no xerophthalmia ENT: no sore throat, + thyroidectomy, - dysphagia/odynophagia, no hoarseness Cardiovascular: no Chest Pain, no Shortness of Breath, no palpitations, no leg swelling Respiratory: no cough, no SOB Gastrointestinal: no nausea, no vomiting, no diarrhea.  Musculoskeletal: no muscle/joint aches Skin: no rashes Neurological: no tremors, no numbness, no tingling, no dizziness Psychiatric: no depression, no anxiety  Objective:    BP 131/86   Pulse 88   Ht 5\' 3"  (1.6 m)   Wt 161 lb (73 kg)   BMI 28.52 kg/m   Wt Readings from Last 3 Encounters:  04/02/18 161 lb (73 kg)  11/27/17 162 lb (73.5 kg)  10/24/17 162 lb (73.5 kg)    Physical Exam  Constitutional: + over weight for height, not in acute distress, normal state of mind Eyes: PERRLA, EOMI, no exophthalmos ENT: moist mucous membranes, + well-healed  post thyroidectomy scar on anterior lower neck , no cervical lymphadenopathy  Musculoskeletal: no gross deformities, strength intact in all four extremities Skin: moist, warm, no rashes Neurological: no tremor with outstretched hands   CMP ( most recent) CMP     Component Value Date/Time   NA 142 03/26/2018 1041   K 4.0 03/26/2018 1041   CL 107 03/26/2018 1041   CO2 26 03/26/2018 1041   GLUCOSE 86 03/26/2018 1041   BUN 13 03/26/2018 1041   CREATININE 0.80 03/26/2018 1041   CALCIUM 9.2 03/26/2018 1041   PROT 7.2 03/26/2018 1041   ALBUMIN 3.2 (L) 02/06/2017 0444   AST 16 03/26/2018 1041   ALT 15 03/26/2018 1041   ALKPHOS 68 02/06/2017 0444   BILITOT 0.4 03/26/2018 1041   GFRNONAA 74 03/26/2018 1041   GFRAA 85 03/26/2018 1041    Results for Kristina Huang, Kristina Huang (MRN [id number]) as of 04/02/2018 12:50  Ref. Range 04/29/2017 11:36 11/21/2017 09:20 03/26/2018 10:41   TSH Latest Ref Range: 0.40 - 4.50 Huang/L 2.96 0.12 (L) 0.11 (L)  Triiodothyronine,Free,Serum Latest Ref Range: 2.3 - 4.2 pg/mL   2.0 (L)  T4,Free(Direct) Latest Ref Range: 0.8 - 1.8 ng/dL 1.4 1.6 1.7  Thyroglobulin Latest Units: ng/mL 1.1 (L)    Thyroglobulin Ab Latest Ref Range: < or = 1 IU/mL <1        Fine-needle aspiration cytology of the thyroid on 10/23/2016 showed: THYROID, FINE NEEDLE ASPIRATION, LEFT (SPECIMEN 1 OF 1, COLLECTED 10/23/16): CONSISTENT WITH BENIGN FOLLICULAR NODULE (BETHESDA CATEGORY II).  02/05/2017: She underwent total thyroidectomy: 02/05/2017: INAL DIAGNOSIS Diagnosis 1. Thyroid, lobectomy, left - NODULAR HYPERPLASIA (MULTINODULAR GOITER). 2. Thyroid, lobectomy, right - PAPILLARY THYROID CARCINOMA, TWO TUMORS, 0.5 AND 0.2 CM. - PAPILLARY CARCINOMA FOCALLY EXTENDS INTO THE PERITHYROIDAL CONNECTIVE TISSUE. - HURTHLE CELL ADENOMATOUS NODULE. - NODULAR HYPERPLASIA.  TNM code: pT1a , pNX.   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  No distant sites of iodine-avid metastatic thyroid cancer Identified.  Surveillance thyroid/neck ultrasound from 05/19/2017 showed:  Surgically absent thyroid lobes. A single morphologically unremarkable 0.8 cm left cervical lymph node is noted. No pathologic adenopathy.  IMPRESSION: No evidence of residual/ recurrent tissue post thyroidectomy   Thyroid/neck ultrasound on February 23, 2018 IMPRESSION: 1. Post total thyroidectomy without evidence of residual nodular tissue within the thyroidectomy resection bed. 2. Solitary left-sided cervical lymph node is not enlarged by size criteria measuring 0.3 cm in diameter. No definitive bulky regional cervical lymphadenopathy.    Assessment & Plan:   1. Papillary carcinoma of the thyroid on the right lobe in the surgical sample, nodular hyperplasia of the  left lobe   -  Underwent total  thyroidectomy on 02/05/2017. Her surgical sample unexpectedly revealed occult malignancy on the right lobe of the thyroid, 0.5 cm  with focal extrathyroidal extension and 0.2 cm foci.  TNM code: pT1a , pNX. - She is status post total thyroidectomy and I-131 thyroid remnant ablation on 04/15/2017.  - Thyroglobulin level and thyroglobulin antibodies are  low  on the day of or body scan. - Whole-body scan IMPRESSION: Uptake in the cervical region suspect representing a combination of thyroid remnant and a metastatic cervical  lymph node.  No distant sites of iodine-avid metastatic thyroid cancer Identified.  - Surveillance  thyroid/neck ultrasound - is unremarkable except for nonspecific 0.8 cm lymph node on the left cervical region.   Subsequent thyroid/neck ultrasound on February 23, 2018 showed decreasing size of his nonspecific lymph node to 0.3 cm. - She will not require any intervention at this time.  She will be considered for Thyrogen stimulated whole-body scan in 1 year.  2. Postsurgical hypothyroidism  -Her thyroid function tests are consistent with over replacement with suppressed TSH and elevated free T4.  I discussed and lowered her levothyroxine to 112 mcg p.o. daily before breakfast.    - The goal of therapy in thyroid cancer suppressive therapy is to keep TSH between 0.1-0.5 .   - We discussed about correct intake of levothyroxine, at fasting, with water, separated by at least 30 minutes from breakfast, and separated by more than 4 hours from calcium, iron, multivitamins, acid reflux medications (PPIs). -Patient is made aware of the fact that thyroid hormone replacement is needed for life, dose to be adjusted by periodic monitoring of thyroid function tests.  - I advised patient to maintain close follow up with Abran Richard, MD for primary care needs.  Follow up plan: Return in about 4 months (around 08/02/2018) for Follow up with Pre-visit  Labs.  Glade Lloyd, MD Phone: [phone]  Fax: [phone]  This note was partially dictated with voice recognition software. Similar sounding words can be transcribed inadequately or may not  be corrected upon review.  04/02/2018, 12:46 PM

## 2018-05-23 ENCOUNTER — Other Ambulatory Visit: Payer: Self-pay

## 2018-05-23 ENCOUNTER — Emergency Department (HOSPITAL_COMMUNITY)
Admission: EM | Admit: 2018-05-23 | Discharge: 2018-05-23 | Disposition: A | Discharge status: Home / Self Care | Payer: Medicare PPO | Attending: Emergency Medicine | Admitting: Emergency Medicine

## 2018-05-23 ENCOUNTER — Emergency Department (HOSPITAL_COMMUNITY): Payer: Medicare PPO

## 2018-05-23 ENCOUNTER — Encounter (HOSPITAL_COMMUNITY): Payer: Self-pay | Admitting: Emergency Medicine

## 2018-05-23 DIAGNOSIS — Z87891 Personal history of nicotine dependence: Secondary | ICD-10-CM | POA: Insufficient documentation

## 2018-05-23 DIAGNOSIS — Z9049 Acquired absence of other specified parts of digestive tract: Secondary | ICD-10-CM | POA: Insufficient documentation

## 2018-05-23 DIAGNOSIS — I10 Essential (primary) hypertension: Secondary | ICD-10-CM | POA: Diagnosis not present

## 2018-05-23 DIAGNOSIS — M25562 Pain in left knee: Secondary | ICD-10-CM

## 2018-05-23 DIAGNOSIS — E119 Type 2 diabetes mellitus without complications: Secondary | ICD-10-CM | POA: Insufficient documentation

## 2018-05-23 DIAGNOSIS — Z85850 Personal history of malignant neoplasm of thyroid: Secondary | ICD-10-CM | POA: Diagnosis not present

## 2018-05-23 DIAGNOSIS — Z7984 Long term (current) use of oral hypoglycemic drugs: Secondary | ICD-10-CM | POA: Insufficient documentation

## 2018-05-23 DIAGNOSIS — Z79899 Other long term (current) drug therapy: Secondary | ICD-10-CM | POA: Insufficient documentation

## 2018-05-23 DIAGNOSIS — Z7982 Long term (current) use of aspirin: Secondary | ICD-10-CM | POA: Insufficient documentation

## 2018-05-23 MED ORDER — KETOROLAC TROMETHAMINE 60 MG/2ML IM SOLN
60.0000 mg | Freq: Once | INTRAMUSCULAR | Status: AC
  Administered 2018-05-23: 60 mg via INTRAMUSCULAR
  Filled 2018-05-23: qty 2

## 2018-05-23 MED ORDER — OXYCODONE-ACETAMINOPHEN 5-325 MG PO TABS: 1.0000 | ORAL_TABLET | ORAL | 0 refills | Status: DC | PRN

## 2018-05-23 MED ORDER — PREDNISONE 10 MG PO TABS: 10.0000 mg | ORAL_TABLET | Freq: Every day | ORAL | 0 refills | Status: DC

## 2018-05-23 NOTE — ED Triage Notes (Signed)
Pt reports left knee pain worse for two months. Pt has appt to see Dr. Aline Brochure on Oct 16th. Pt has had xrays. Reports here for pain control.

## 2018-05-23 NOTE — ED Provider Notes (Signed)
[facility] EMERGENCY DEPARTMENT Provider Note   CSN: [id number] Arrival date & time: 05/23/18  0848     History   Chief Complaint Chief Complaint  Patient presents with  . Knee Pain    HPI Kristina Huang is a 72 y.o. female.  Patient reports chronic left knee pain and proximal left femur pain for unknown length of time.  No trauma.  No fever, sweats, chills.  Severity of pain is moderate.  Ambulation makes pain worse.  She has been taking Voltaren 75 mg with minimal success.  She has an orthopedic appointment on 05/27/2018.     Past Medical History:  Diagnosis Date  . Acid reflux   . Arthritis   . Cancer ([place])    Thyroid  . Diabetes mellitus without complication (HCC)    fasting 100-130  . Family history of adverse reaction to anesthesia   . Hypercholesterolemia   . Hypertension   . 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 Malignant neoplasm of thyroid gland ([place]) 03/18/2017  . 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 Multinodular goiter 12/10/2016  . Personal history of colonic polyps 08/23/2014  . GERD (gastroesophageal reflux disease) 08/23/2014  . Chronic diarrhea 08/23/2014  . Spondylolisthesis at L3-L4 level 11/17/2013  . COLONIC POLYPS, HYPERPLASTIC 02/03/2009  . HYPERLIPIDEMIA 02/03/2009  . HYPERTENSION 02/03/2009  . GERD 02/03/2009  . ARTHRITIS 02/03/2009    Past Surgical History:  Procedure Laterality Date  . BACK SURGERY     spinal fusion  . BUNIONECTOMY Bilateral   . CHOLECYSTECTOMY     Dr. Irving Shows  . COLONOSCOPY  2009   Dr. Oneida Alar: moderate internal hemorrhoids, frequent sigmoid colon diverticula, polyps X 3, one large adenoma otherwise hyperplastic  . COLONOSCOPY N/A 09/09/2014   Procedure: COLONOSCOPY;  Surgeon: Danie Binder, MD;  Location: [facility];  Service: Endoscopy;  Laterality: N/A;  200 - moved to 1/29 @ 12:45  . COLONOSCOPY N/A 10/24/2017   Procedure: COLONOSCOPY;  Surgeon: Danie Binder, MD;  Location: [facility];  Service: Endoscopy;  Laterality: N/A;  9:30  . ESOPHAGOGASTRODUODENOSCOPY N/A 09/09/2014   Procedure: ESOPHAGOGASTRODUODENOSCOPY (EGD);  Surgeon: Danie Binder, MD;  Location: [facility];  Service: Endoscopy;  Laterality: N/A;  . THYROIDECTOMY N/A 02/05/2017   Procedure: THYROIDECTOMY;  Surgeon: Aviva Signs, MD;  Location: [facility] ORS;  Service: General;  Laterality: N/A;  . TUBAL LIGATION       OB History   None      Home Medications    Prior to Admission medications   Medication Sig Start Date End Date Taking? Authorizing Provider  acetaminophen (TYLENOL) 500 MG tablet Take 1,500 mg by mouth every 6 (six) hours as needed for moderate pain or headache.    [provider]  aspirin EC 81 MG tablet Take 81 mg by mouth at bedtime.     [provider]  calcium-vitamin D (OSCAL WITH D) 500-200 MG-UNIT tablet Take 1 tablet by mouth 2 (two) times daily. Patient taking differently: Take 1 tablet by mouth every other day.  02/06/17   Aviva Signs, MD  diclofenac (VOLTAREN) 75 MG EC tablet 2 (two) times daily. 01/16/18   [provider]  hydrochlorothiazide (HYDRODIURIL) 25 MG tablet Take 25 mg by mouth daily.    [provider]  levothyroxine (SYNTHROID, LEVOTHROID) 112 MCG tablet Take 1 tablet (112 mcg total) by mouth daily  before breakfast. 04/02/18   Cassandria Anger, MD  losartan (COZAAR) 50 MG tablet Take 50 mg by mouth daily.  11/25/16   [provider]  metFORMIN (GLUCOPHAGE) 500 MG tablet Take 500 mg by mouth daily with breakfast.    [provider]  omeprazole (PRILOSEC) 20 MG capsule Take 20 mg by mouth daily.    [provider]  oxyCODONE-acetaminophen (PERCOCET/ROXICET) 5-325 MG tablet Take 1 tablet by mouth every 4 (four) hours as needed for severe pain. 05/23/18   Nat Christen, MD  polyethylene glycol-electrolytes (TRILYTE) 420 g solution  Take 4,000 mLs by mouth as directed. 09/17/17   Mahala Menghini, PA-C  predniSONE (DELTASONE) 10 MG tablet Take 1 tablet (10 mg total) by mouth daily with breakfast. 3 tablets for 3 days, 2 tablets for 3 days, 1 tablet for 3 days 05/23/18   Nat Christen, MD    Family History Family History  Problem Relation Age of Onset  . Colon cancer Neg Hx     Social History Social History   Tobacco Use  . Smoking status: Former Smoker    Packs/day: 0.25    Years: 2.00    Pack years: 0.50    Types: Cigarettes    Last attempt to quit: 01/31/2010    Years since quitting: 8.3  . Smokeless tobacco: Never Used  Substance Use Topics  . Alcohol use: No    Alcohol/week: 0.0 standard drinks  . Drug use: No     Allergies   Patient has no known allergies.   Review of Systems Review of Systems  All other systems reviewed and are negative.    Physical Exam Updated Vital Signs BP (!) 98/56   Pulse 65   Temp 98 F (36.7 C) (Oral)   Resp 15   Ht 5\' 3"  (1.6 m)   Wt 71.2 kg   SpO2 100%   BMI 27.81 kg/m   Physical Exam  Constitutional: She is oriented to person, place, and time. She appears well-developed and well-nourished.  HENT:  Head: Normocephalic and atraumatic.  Eyes: Conjunctivae are normal.  Neck: Neck supple.  Cardiovascular: Normal rate and regular rhythm.  Pulmonary/Chest: Effort normal and breath sounds normal.  Abdominal: Soft. Bowel sounds are normal.  Musculoskeletal:  Left lower extremity: Tender proximal femur.  Full range of motion of left knee.  Neurological: She is alert and oriented to person, place, and time.  Skin: Skin is warm and dry.  Psychiatric: She has a normal mood and affect. Her behavior is normal.  Nursing note and vitals reviewed.    ED Treatments / Results  Labs (all labs ordered are listed, but only abnormal results are displayed) Labs Reviewed - No data to display  EKG None  Radiology Dg Hip Unilat W Or Wo Pelvis 2-3 Views Left  Result  Date: 05/23/2018 CLINICAL DATA:  Progressive left-sided hip pain. EXAM: DG HIP (WITH OR WITHOUT PELVIS) 2-3V LEFT COMPARISON:  None. FINDINGS: Right greater than left advanced degenerative changes are present in the hips. Subchondral cyst formation is worse on the right. Sclerotic changes are present on the left without collapse. The hip is located. No acute abnormality is present. Advanced degenerative and postsurgical changes are present in the lumbar spine. IMPRESSION: 1. Advanced degenerative changes of the left hip without acute abnormality. 2. More severe right hip degenerative changes are present. Electronically Signed   By: San Morelle M.D.   On: 05/23/2018 10:26    Procedures Procedures (including critical care time)  Medications Ordered in ED Medications  ketorolac (TORADOL) injection 60 mg (60 mg Intramuscular Given 05/23/18 1015)     Initial Impression / Assessment and Plan / ED Course  I have reviewed the triage vital signs and the nursing notes.  Pertinent labs & imaging results that were available during my care of the patient were reviewed by me and considered in my medical decision making (see chart for details).     Plain films of left hip show severe degenerative changes, right hip greater than left hip.  Discharge medication Percocet and prednisone.  Discussed with patient and her husband.  Final Clinical Impressions(s) / ED Diagnoses   Final diagnoses:  Left knee pain, unspecified chronicity    ED Discharge Orders         Ordered    oxyCODONE-acetaminophen (PERCOCET/ROXICET) 5-325 MG tablet  Every 4 hours PRN     05/23/18 1152    predniSONE (DELTASONE) 10 MG tablet  Daily with breakfast     05/23/18 1152           Nat Christen, MD 05/23/18 1519

## 2018-05-23 NOTE — Discharge Instructions (Signed)
X-rays showed significant arthritis.  No fracture.  Prescription for prednisone and pain medicine.  Follow-up with your orthopedic doctor as scheduled.

## 2018-05-27 ENCOUNTER — Telehealth: Payer: Self-pay | Admitting: Radiology

## 2018-05-27 ENCOUNTER — Encounter: Payer: Self-pay | Admitting: Orthopedic Surgery

## 2018-05-27 ENCOUNTER — Ambulatory Visit: Payer: Medicare PPO | Admitting: Orthopedic Surgery

## 2018-05-27 VITALS — BP 150/91 | HR 97 | Ht 63.0 in | Wt 153.0 lb

## 2018-05-27 DIAGNOSIS — Z981 Arthrodesis status: Secondary | ICD-10-CM | POA: Diagnosis not present

## 2018-05-27 DIAGNOSIS — M1712 Unilateral primary osteoarthritis, left knee: Secondary | ICD-10-CM | POA: Diagnosis not present

## 2018-05-27 DIAGNOSIS — M160 Bilateral primary osteoarthritis of hip: Secondary | ICD-10-CM

## 2018-05-27 DIAGNOSIS — M1612 Unilateral primary osteoarthritis, left hip: Secondary | ICD-10-CM

## 2018-05-27 DIAGNOSIS — M1611 Unilateral primary osteoarthritis, right hip: Secondary | ICD-10-CM

## 2018-05-27 NOTE — Progress Notes (Signed)
Kristina Huang  05/27/2018  HISTORY SECTION :  Chief Complaint  Patient presents with  . Knee Pain    has pain entire left leg knee, hip toes numb    HPI The patient presents for evaluation of what turns out to be left hip pain  The patient has had lumbar fusion she has bilateral hip pain left knee pain she was on diclofenac doing reasonably well in about 2 or 3 weeks ago had acute pain in her left hip went to the ER.  She had x-rays there she had severe arthritis of the right hip and moderate to severe arthritis of the left hip.  She is using a cane she does take Percocet which she got from the ER which does make the pain manageable.  She has residual numbness in her toes on the left side all except the great toe  Review of Systems  Respiratory: Negative for shortness of breath.   Cardiovascular: Negative for chest pain.  Musculoskeletal: Positive for back pain and joint pain.  Skin: Negative for rash.  Neurological: Positive for sensory change.  All other systems reviewed and are negative.   Past Medical History:  Diagnosis Date  . Acid reflux   . Arthritis   . Cancer ([place])    Thyroid  . Diabetes mellitus without complication (HCC)    fasting 100-130  . Family history of adverse reaction to anesthesia   . Hypercholesterolemia   . Hypertension   . 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 BACK SURGERY     spinal fusion  . BUNIONECTOMY Bilateral   . CHOLECYSTECTOMY     Dr. Irving Shows  . COLONOSCOPY  2009   Dr. Oneida Alar: moderate internal hemorrhoids, frequent sigmoid colon diverticula, polyps X 3, one large adenoma otherwise hyperplastic  . COLONOSCOPY N/A 09/09/2014   Procedure: COLONOSCOPY;  Surgeon: Danie Binder, MD;  [facility];  Service: Endoscopy;  Laterality: N/A;  200 - moved to 1/29 @ 12:45  . COLONOSCOPY N/A 10/24/2017   Procedure: COLONOSCOPY;  Surgeon: Danie Binder, MD;  Location: [facility];  Service: Endoscopy;  Laterality: N/A;  9:30  . ESOPHAGOGASTRODUODENOSCOPY N/A 09/09/2014   Procedure: ESOPHAGOGASTRODUODENOSCOPY (EGD);  Surgeon: Danie Binder, MD;  Location: [facility];  Service: Endoscopy;  Laterality: N/A;  . [place]  . SPINAL FUSION  2015  . THYROIDECTOMY N/A 02/05/2017   Procedure: THYROIDECTOMY;  Surgeon: Aviva Signs, MD;  Location: [facility];  Service: General;  Laterality: N/A;  . TUBAL LIGATION      Social History   Tobacco Use  . Smoking status: Former Smoker    Packs/day: 0.25    Years: 2.00    Pack years: 0.50    Types: Cigarettes    Last attempt to quit: 01/31/2010    Years since quitting: 8.3  . Smokeless tobacco: Never Used  Substance Use Topics  . Alcohol use: No    Alcohol/week: 0.0 standard drinks  . Drug use: No    Family History  Problem Relation Age of Onset  . Heart disease Mother   . Heart disease Father   . Colon cancer Neg Hx       No Known Allergies   Current Outpatient Medications:  .  diclofenac (VOLTAREN) 75 MG EC tablet, 2 (two) times daily., Disp: , Rfl:  .  hydrochlorothiazide (HYDRODIURIL) 25 MG tablet,  Take 25 mg by mouth daily., Disp: , Rfl:  .  levothyroxine (SYNTHROID, LEVOTHROID) 112 MCG tablet, Take 1 tablet (112 mcg total) by mouth daily before breakfast., Disp: 30 tablet, Rfl: 4 .  losartan (COZAAR) 50 MG tablet, Take 50 mg by mouth daily. , Disp: , Rfl:  .  metFORMIN (GLUCOPHAGE) 500 MG tablet, Take 500 mg by mouth daily with breakfast., Disp: , Rfl:  .  omeprazole (PRILOSEC) 20 MG capsule, Take 20 mg by mouth daily., Disp: , Rfl:  .  oxyCODONE-acetaminophen (PERCOCET/ROXICET) 5-325 MG tablet, Take 1 tablet by mouth every 4 (four) hours as needed for severe pain., Disp: 12 tablet, Rfl: 0 .  acetaminophen (TYLENOL) 500 MG tablet, Take 1,500 mg by mouth every 6 (six) hours as needed for moderate pain or headache., Disp: , Rfl:  .  aspirin EC 81 MG tablet, Take 81 mg by  mouth at bedtime. , Disp: , Rfl:  .  calcium-vitamin D (OSCAL WITH D) 500-200 MG-UNIT tablet, Take 1 tablet by mouth 2 (two) times daily. (Patient not taking: Reported on 05/27/2018), Disp: 60 tablet, Rfl: 1 .  polyethylene glycol-electrolytes (TRILYTE) 420 g solution, Take 4,000 mLs by mouth as directed. (Patient not taking: Reported on 05/27/2018), Disp: 4000 mL, Rfl: 0   PHYSICAL EXAM SECTION: BP (!) 150/91   Pulse 97   Ht 5\' 3"  (1.6 m)   Wt 153 lb (69.4 kg)   BMI 27.10 kg/m   Body mass index is 27.1 kg/m. General appearance: Well-developed well-nourished no gross deformities  Eyes clear normal vision no evidence of conjunctivitis or jaundice, extraocular muscles intact  ENT: ears hearing normal, nasal passages clear, throat clear   Lymph nodes: No lymphadenopathy  Neck is supple without palpable mass, full range of motion  Cardiovascular normal pulse and perfusion in all 4 extremities normal color without edema  Neurologically deep tendon reflexes are equal and normal, no sensation loss or deficits no pathologic reflexes  Psychological: Awake alert and oriented x3 mood and affect normal  Skin no lacerations or ulcerations no nodularity no palpable masses, no erythema or nodularity  Musculoskeletal: Physical Exam  Musculoskeletal:       Right shoulder: Normal.       Left shoulder: Normal.       Right hip: She exhibits decreased range of motion, decreased strength, tenderness and crepitus. She exhibits no swelling, no deformity and no laceration.       Left hip: She exhibits decreased range of motion, decreased strength, tenderness and crepitus. She exhibits no bony tenderness, no swelling, no deformity and no laceration.       Right knee: She exhibits normal range of motion, no swelling, no effusion, no ecchymosis, no deformity, no laceration, no erythema, normal alignment, no LCL laxity, normal patellar mobility, normal meniscus and no MCL laxity. No tenderness found. No  medial joint line, no lateral joint line, no MCL, no LCL and no patellar tendon tenderness noted.       Left knee: She exhibits decreased range of motion. She exhibits no swelling, no effusion, no ecchymosis, no deformity, no laceration, no erythema, normal alignment, no LCL laxity, normal patellar mobility, no bony tenderness, normal meniscus and no MCL laxity. Tenderness found. Medial joint line tenderness noted.       Lumbar back: She exhibits decreased range of motion. She exhibits no tenderness, no pain and no spasm.       Right upper arm: Normal.       Left upper arm:  Normal.  Neurological: She displays no atrophy and no tremor. She exhibits normal muscle tone. She displays no seizure activity. Gait abnormal.  Gait supported by a cane       MEDICAL DECISION SECTION:    Encounter Diagnoses  Name Primary?  . Primary osteoarthritis of left hip Yes  . Primary osteoarthritis of right hip   . S/P lumbar fusion   . Primary osteoarthritis of left knee     Imaging I have placed the disc images on the media section of the chart.  3 views of the left knee show severe arthritis all 3 compartments with joint space narrowing osteophyte formation cyst formation overall alignment is valgus.  Subchondral sclerosis also noted  Impression severe arthritis left knee all 3 compartments  Hip x-rays are also done at [facility] I have interpreted that as severe arthritis right hip grade 3 moderate severe arthritis left hip grade 2  Both hips knees surgery the left is more symptomatic we are setting her up for injection as she did not want to have surgery at this time  I would tend to stay away from the Percocet  She will need a preop clearance from medicine before surgery  Plan:  (Rx., Inj., surg., Frx, MRI/CT, XR:2)  Left hip joint injection  Follow-up 4 weeks after injection  3:13 PM

## 2018-05-27 NOTE — Telephone Encounter (Signed)
Call Forestine Na to schedule left hip injection

## 2018-05-27 NOTE — Patient Instructions (Signed)
We will call you with the appointment for the hip injection at [facility]

## 2018-05-28 NOTE — Telephone Encounter (Signed)
Thursday October 24th at 9am left hip injection  Called patient to advise.

## 2018-06-02 ENCOUNTER — Other Ambulatory Visit (HOSPITAL_COMMUNITY): Payer: Medicare PPO

## 2018-06-04 ENCOUNTER — Ambulatory Visit (HOSPITAL_COMMUNITY)
Admission: RE | Admit: 2018-06-04 | Discharge: 2018-06-04 | Disposition: A | Discharge status: Home / Self Care | Payer: Medicare PPO | Source: Ambulatory Visit | Attending: Orthopedic Surgery | Admitting: Orthopedic Surgery

## 2018-06-04 DIAGNOSIS — M1612 Unilateral primary osteoarthritis, left hip: Secondary | ICD-10-CM | POA: Diagnosis present

## 2018-06-04 MED ORDER — IOPAMIDOL (ISOVUE-300) INJECTION 61%
INTRAVENOUS | Status: AC
  Administered 2018-06-04: 2 mL
  Filled 2018-06-04: qty 50

## 2018-06-04 MED ORDER — LIDOCAINE HCL (PF) 1 % IJ SOLN
INTRAMUSCULAR | Status: AC
  Administered 2018-06-04: 10 mL
  Filled 2018-06-04: qty 10

## 2018-06-04 MED ORDER — POVIDONE-IODINE 10 % EX SOLN
CUTANEOUS | Status: AC
  Filled 2018-06-04: qty 15

## 2018-06-04 MED ORDER — METHYLPREDNISOLONE ACETATE 40 MG/ML IJ SUSP
INTRAMUSCULAR | Status: AC
  Administered 2018-06-04: 40 mg
  Filled 2018-06-04: qty 1

## 2018-06-04 NOTE — Procedures (Signed)
Preprocedure Dx: Primary osteoarthritis of left hip Postprocedure Dx: Primary osteoarthritis of left hip Procedure  Fluoroscopically guided therapeutic LEFT joint injection Radiologist:  Thornton Papas Anesthesia:  7 ml of 1% lidocaine Injectate:  40 mg Depo-Medrol, 3 ml of 1% lidocaine Fluoro time:  1 minutes 18 seconds EBL:   None Complications: None

## 2018-06-15 ENCOUNTER — Other Ambulatory Visit: Payer: Self-pay | Admitting: Orthopedic Surgery

## 2018-06-15 ENCOUNTER — Telehealth: Payer: Self-pay | Admitting: Orthopedic Surgery

## 2018-06-15 DIAGNOSIS — M1612 Unilateral primary osteoarthritis, left hip: Secondary | ICD-10-CM

## 2018-06-15 MED ORDER — NAPROXEN 500 MG PO TABS: 500.0000 mg | ORAL_TABLET | Freq: Two times a day (BID) | ORAL | 2 refills | Status: DC

## 2018-06-15 NOTE — Telephone Encounter (Signed)
I called her, she said the injection did help her left  groin/ hip pain but wore off after only a couple days. She states she needs something for pain and she wants to know what else is next, she is in severe pain  I told her next step may be the hip replacement, we will let her know  Please advise.

## 2018-06-15 NOTE — Telephone Encounter (Signed)
Patient called to relay she has had her flouro injection of left hip at [facility]. I've scheduled her 4 week follow up appointment as per chart note. Patient states she is seeing her primary care doctor, also per Dr Aline Brochure, and appointment is tomorrow, 06/16/18.  Patient states she is still hurting so badly.  Would like to know if someone can call her. Ph# [phone].

## 2018-06-15 NOTE — Telephone Encounter (Signed)
WE WILL SEND HER TO DR Rush Farmer FOR THA

## 2018-06-16 NOTE — Telephone Encounter (Signed)
I advised patient, and she agrees, have put in the referral to Dr Ninfa Linden

## 2018-06-24 ENCOUNTER — Other Ambulatory Visit: Payer: Self-pay

## 2018-06-24 MED ORDER — LEVOTHYROXINE SODIUM 112 MCG PO TABS: 112.0000 ug | ORAL_TABLET | Freq: Every day | ORAL | 4 refills | Status: DC

## 2018-06-29 ENCOUNTER — Encounter (INDEPENDENT_AMBULATORY_CARE_PROVIDER_SITE_OTHER): Payer: Self-pay | Admitting: Physician Assistant

## 2018-06-29 ENCOUNTER — Ambulatory Visit (INDEPENDENT_AMBULATORY_CARE_PROVIDER_SITE_OTHER): Payer: Medicare PPO | Admitting: Physician Assistant

## 2018-06-29 DIAGNOSIS — M160 Bilateral primary osteoarthritis of hip: Secondary | ICD-10-CM | POA: Diagnosis not present

## 2018-06-29 NOTE — Progress Notes (Signed)
Office Visit Note   Patient: Kristina Huang           Date of Birth: [birth year]           MRN: [id number] Visit Date: 06/29/2018              Requested by: Abran Richard, MD [address] [place], [place] [address] PCP: Abran Richard, MD   Assessment & Plan: Visit Diagnoses:  1. Primary osteoarthritis of both hips     Plan: Kristina Huang would like to undergo left total hip surgery in the near future.  Postoperative protocol reviewed with patient.  Handout on anterior hip surgery was given.  Hip model was shown to the patient.  Questions were encouraged and answered by Dr. Ninfa Linden and myself.  Risk including but not limited to nerve/vessel injury, wound infection, worsening pain, prolonged pain, DVT/PE and hardware failure all discussed with patient at length.  We will proceed with a left total hip arthroplasty in the near future.  She will continue to get her pain medicine from her primary care physician until the time of surgery.  Follow-Up Instructions: Return for 2 weeks postop.   Orders:  No orders of the defined types were placed in this encounter.  No orders of the defined types were placed in this encounter.     Procedures: No procedures performed   Clinical Data: No additional findings.   Subjective: Chief Complaint  Patient presents with  . Left Hip - Pain    HPI  Kristina Huang is a 72 year old female comes in today with left hip pain.  She is sent by Dr. Aline Brochure for evaluation of her left hip.  She states she is had pain for years but her pain is become worse over the last few months.  She does have some left knee pain and reports arthritis of the knee but mainly her hip bothers her.  She was given an intra-articular injection in the left hip on 06/04/2018 states that it eased the pain for a few days but did not take all of her pain way.  She ambulates with a cane due to the hip pain.  She has trouble putting on her shoes and socks even trouble putting on  her pants to decrease flexion of both hips.  She is diabetic and has been diabetic for greater than 10 years reports her hemoglobin A1c of approximately 6.0. Radiographs [facility] pelvis and lateral view of left hip are personally reviewed.  Both hips are well located.  Severe end-stage arthritis bilateral hips right hip slightly worse than the left.  There is osteophytes of both around both hips.  No acute fracture.  No obvious avascular necrosis.  Hardware is noted in the lumbar spine region.  Review of Systems Denies any fevers chills shortness of breath chest pain or recent infection.  Objective: Vital Signs:   Physical Exam  Constitutional: She is oriented to person, place, and time. She appears well-developed and well-nourished. No distress.  Cardiovascular: Intact distal pulses.  Pulmonary/Chest: Effort normal.  Neurological: She is alert and oriented to person, place, and time.  Skin: She is not diaphoretic.  Psychiatric: She has a normal mood and affect.    Ortho Exam She has limitations of external rotation bilateral hips and virtually no internal rotation of either hip.  Attempts of internal rotation both hips causes severe pain.  She has flexed left knee which she is able to bring down to almost full extension but this  is very painful.  Calves are supple nontender bilaterally.  Dorsiflexion plantarflexion bilateral ankles intact.  Leg lengths appear equal on exam. Specialty Comments:  No specialty comments available.  Imaging: No results found.   PMFS History: Patient Active Problem List   Diagnosis Date Noted  . Malignant neoplasm of thyroid gland ([place]) 03/18/2017  . 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 Multinodular goiter 12/10/2016  . Personal history of colonic polyps 08/23/2014  . GERD (gastroesophageal reflux disease) 08/23/2014  . Chronic diarrhea 08/23/2014  . Spondylolisthesis at L3-L4 level 11/17/2013  . COLONIC POLYPS, HYPERPLASTIC 02/03/2009  .  HYPERLIPIDEMIA 02/03/2009  . HYPERTENSION 02/03/2009  . GERD 02/03/2009  . ARTHRITIS 02/03/2009   Past Medical History:  Diagnosis Date  . Acid reflux   . Arthritis   . Cancer ([place])    Thyroid  . Diabetes mellitus without complication (HCC)    fasting 100-130  . Family history of adverse reaction to anesthesia   . Hypercholesterolemia   . Hypertension   . Thyroid goiter    right tracheal deviation by CXR 10/2013    Family History  Problem Relation Age of Onset  . Heart disease Mother   . Heart disease Father   . Colon cancer Neg Hx     Past Surgical History:  Procedure Laterality Date  . BACK SURGERY     spinal fusion  . BUNIONECTOMY Bilateral   . CHOLECYSTECTOMY     Dr. Irving Shows  . COLONOSCOPY  2009   Dr. Oneida Alar: moderate internal hemorrhoids, frequent sigmoid colon diverticula, polyps X 3, one large adenoma otherwise hyperplastic  . COLONOSCOPY N/A 09/09/2014   Procedure: COLONOSCOPY;  Surgeon: Danie Binder, MD;  [facility];  Service: Endoscopy;  Laterality: N/A;  200 - moved to 1/29 @ 12:45  . COLONOSCOPY N/A 10/24/2017   Procedure: COLONOSCOPY;  Surgeon: Danie Binder, MD;  [facility];  Service: Endoscopy;  Laterality: N/A;  9:30  . ESOPHAGOGASTRODUODENOSCOPY N/A 09/09/2014   Procedure: ESOPHAGOGASTRODUODENOSCOPY (EGD);  Surgeon: Danie Binder, MD;  [facility];  Service: Endoscopy;  Laterality: N/A;  . [place]  . SPINAL FUSION  2015  . THYROIDECTOMY N/A 02/05/2017   Procedure: THYROIDECTOMY;  Surgeon: Aviva Signs, MD;  Location: [facility];  Service: General;  Laterality: N/A;  . TUBAL LIGATION     Social History   Occupational History  . Not on file  Tobacco Use  . Smoking status: Former Smoker    Packs/day: 0.25    Years: 2.00    Pack years: 0.50    Types: Cigarettes    Last attempt to quit: 01/31/2010    Years since quitting: 8.4  . Smokeless tobacco: Never Used  Substance and Sexual Activity  .  Alcohol use: No    Alcohol/week: 0.0 standard drinks  . Drug use: No  . Sexual activity: Not Currently    Birth control/protection: Surgical

## 2018-06-30 ENCOUNTER — Telehealth: Payer: Self-pay | Admitting: Orthopedic Surgery

## 2018-06-30 NOTE — Telephone Encounter (Signed)
FYI:  Patient called and canceled her appointment for Friday 07/03/18. Stated she went yesterday to see the doctor that Dr. Aline Brochure referred her to and she stated they were going to do surgery.

## 2018-07-03 ENCOUNTER — Ambulatory Visit: Payer: Medicare PPO | Admitting: Orthopedic Surgery

## 2018-07-08 ENCOUNTER — Other Ambulatory Visit: Payer: Self-pay

## 2018-07-08 ENCOUNTER — Emergency Department (HOSPITAL_COMMUNITY)
Admission: EM | Admit: 2018-07-08 | Discharge: 2018-07-08 | Disposition: A | Discharge status: Home / Self Care | Payer: Medicare PPO | Attending: Emergency Medicine | Admitting: Emergency Medicine

## 2018-07-08 ENCOUNTER — Encounter (HOSPITAL_COMMUNITY): Payer: Self-pay

## 2018-07-08 DIAGNOSIS — I10 Essential (primary) hypertension: Secondary | ICD-10-CM | POA: Diagnosis not present

## 2018-07-08 DIAGNOSIS — Z79899 Other long term (current) drug therapy: Secondary | ICD-10-CM | POA: Insufficient documentation

## 2018-07-08 DIAGNOSIS — R197 Diarrhea, unspecified: Secondary | ICD-10-CM

## 2018-07-08 DIAGNOSIS — M25552 Pain in left hip: Secondary | ICD-10-CM

## 2018-07-08 DIAGNOSIS — Z7984 Long term (current) use of oral hypoglycemic drugs: Secondary | ICD-10-CM | POA: Insufficient documentation

## 2018-07-08 DIAGNOSIS — Z87891 Personal history of nicotine dependence: Secondary | ICD-10-CM | POA: Insufficient documentation

## 2018-07-08 DIAGNOSIS — Z85850 Personal history of malignant neoplasm of thyroid: Secondary | ICD-10-CM | POA: Insufficient documentation

## 2018-07-08 DIAGNOSIS — Z7982 Long term (current) use of aspirin: Secondary | ICD-10-CM | POA: Diagnosis not present

## 2018-07-08 DIAGNOSIS — E7800 Pure hypercholesterolemia, unspecified: Secondary | ICD-10-CM | POA: Diagnosis not present

## 2018-07-08 DIAGNOSIS — E119 Type 2 diabetes mellitus without complications: Secondary | ICD-10-CM | POA: Insufficient documentation

## 2018-07-08 DIAGNOSIS — E876 Hypokalemia: Secondary | ICD-10-CM | POA: Diagnosis not present

## 2018-07-08 LAB — BASIC METABOLIC PANEL
Anion gap: 9 (ref 5–15)
BUN: 10 mg/dL (ref 8–23)
CO2: 22 mmol/L (ref 22–32)
Calcium: 9 mg/dL (ref 8.9–10.3)
Chloride: 109 mmol/L (ref 98–111)
Creatinine, Ser: 0.74 mg/dL (ref 0.44–1.00)
GFR calc Af Amer: >60 mL/min (ref >60)
GLUCOSE: 88 mg/dL (ref 70–99)
POTASSIUM: 2.9 mmol/L — AB (ref 3.5–5.1)
Sodium: 140 mmol/L (ref 135–145)

## 2018-07-08 LAB — CBC WITH DIFFERENTIAL/PLATELET
ABS IMMATURE GRANULOCYTES: 0.01 10*3/uL (ref 0.00–0.07)
BASOS PCT: 1 %
Basophils Absolute: 0.1 10*3/uL (ref 0.0–0.1)
EOS ABS: 0.1 10*3/uL (ref 0.0–0.5)
Eosinophils Relative: 2 %
HCT: 46.8 % — ABNORMAL HIGH (ref 36.0–46.0)
Hemoglobin: 15.5 g/dL — ABNORMAL HIGH (ref 12.0–15.0)
Immature Granulocytes: 0 %
Lymphocytes Relative: 33 %
Lymphs Abs: 2.8 10*3/uL (ref 0.7–4.0)
MCH: 31.4 pg (ref 26.0–34.0)
MCHC: 33.1 g/dL (ref 30.0–36.0)
MCV: 94.7 fL (ref 80.0–100.0)
MONOS PCT: 8 %
Monocytes Absolute: 0.6 10*3/uL (ref 0.1–1.0)
Neutro Abs: 4.8 10*3/uL (ref 1.7–7.7)
Neutrophils Relative %: 56 %
PLATELETS: 370 10*3/uL (ref 150–400)
RBC: 4.94 MIL/uL (ref 3.87–5.11)
RDW: 12.9 % (ref 11.5–15.5)
WBC: 8.4 10*3/uL (ref 4.0–10.5)
nRBC: 0 % (ref 0.0–0.2)

## 2018-07-08 MED ORDER — ONDANSETRON HCL 4 MG/2ML IJ SOLN
4.0000 mg | Freq: Once | INTRAMUSCULAR | Status: AC
  Administered 2018-07-08: 4 mg via INTRAVENOUS
  Filled 2018-07-08: qty 2

## 2018-07-08 MED ORDER — MORPHINE SULFATE (PF) 4 MG/ML IV SOLN
4.0000 mg | Freq: Once | INTRAVENOUS | Status: AC
  Administered 2018-07-08: 4 mg via INTRAVENOUS
  Filled 2018-07-08: qty 1

## 2018-07-08 MED ORDER — SODIUM CHLORIDE 0.9 % IV BOLUS
1000.0000 mL | Freq: Once | INTRAVENOUS | Status: AC
  Administered 2018-07-08: 1000 mL via INTRAVENOUS

## 2018-07-08 MED ORDER — POTASSIUM CHLORIDE CRYS ER 20 MEQ PO TBCR
40.0000 meq | EXTENDED_RELEASE_TABLET | Freq: Once | ORAL | Status: AC
  Administered 2018-07-08: 40 meq via ORAL
  Filled 2018-07-08: qty 2

## 2018-07-08 MED ORDER — OXYCODONE-ACETAMINOPHEN 5-325 MG PO TABS: 1.0000 | ORAL_TABLET | ORAL | 0 refills | Status: DC | PRN

## 2018-07-08 NOTE — ED Notes (Signed)
Advised patient not to drive after discharge due to narcotic medication administration. Also advised patient not to drive while taking prescription pain medication. Patient verbalized understanding. Discharged via wheelchair with significant other to drive her home.

## 2018-07-08 NOTE — Discharge Instructions (Signed)
Prescription for pain medicine.  You can also take ibuprofen or Advil with the prescription tablet.  Take your potassium at home.  Google potassium rich foods and try to eat some of them.  Follow-up with your primary care or orthopedic doctor.

## 2018-07-08 NOTE — ED Provider Notes (Signed)
[facility] EMERGENCY DEPARTMENT Provider Note   CSN: [id number] Arrival date & time: 07/08/18  1034     History   Chief Complaint Chief Complaint  Patient presents with  . Hip Pain  . Diarrhea    HPI Kristina Huang is a 72 y.o. female.  Persistent left hip pain for several days.  No new trauma.  Patient is scheduled for a total hip replacement on 07/21/2018.  Additionally, she developed diarrhea yesterday.  She has not been taking her potassium medication.  No fever, sweats, chills.  Severity of symptoms is mild to moderate.  Palpation makes hip pain worse     Past Medical History:  Diagnosis Date  . Acid reflux   . Arthritis   . Cancer ([place])    Thyroid  . Diabetes mellitus without complication (HCC)    fasting 100-130  . Family history of adverse reaction to anesthesia   . Hypercholesterolemia   . Hypertension   . 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 Malignant neoplasm of thyroid gland ([place]) 03/18/2017  . 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 Multinodular goiter 12/10/2016  . Personal history of colonic polyps 08/23/2014  . GERD (gastroesophageal reflux disease) 08/23/2014  . Chronic diarrhea 08/23/2014  . Spondylolisthesis at L3-L4 level 11/17/2013  . COLONIC POLYPS, HYPERPLASTIC 02/03/2009  . HYPERLIPIDEMIA 02/03/2009  . HYPERTENSION 02/03/2009  . GERD 02/03/2009  . ARTHRITIS 02/03/2009    Past Surgical History:  Procedure Laterality Date  . BACK SURGERY     spinal fusion  . BUNIONECTOMY Bilateral   . CHOLECYSTECTOMY     Dr. Irving Shows  . COLONOSCOPY  2009   Dr. Oneida Alar: moderate internal hemorrhoids, frequent sigmoid colon diverticula, polyps X 3, one large adenoma otherwise hyperplastic  . COLONOSCOPY N/A 09/09/2014   Procedure: COLONOSCOPY;  Surgeon: Danie Binder, MD;  Location: [facility] ENDO SUITE;  Service: Endoscopy;  Laterality: N/A;  200 - moved to 1/29 @ 12:45   . COLONOSCOPY N/A 10/24/2017   Procedure: COLONOSCOPY;  Surgeon: Danie Binder, MD;  Location: [facility] ENDO SUITE;  Service: Endoscopy;  Laterality: N/A;  9:30  . ESOPHAGOGASTRODUODENOSCOPY N/A 09/09/2014   Procedure: ESOPHAGOGASTRODUODENOSCOPY (EGD);  Surgeon: Danie Binder, MD;  Location: [facility] ENDO SUITE;  Service: Endoscopy;  Laterality: N/A;  . [place]  . SPINAL FUSION  2015  . THYROIDECTOMY N/A 02/05/2017   Procedure: THYROIDECTOMY;  Surgeon: Aviva Signs, MD;  Location: [facility] ORS;  Service: General;  Laterality: N/A;  . TUBAL LIGATION       OB History   None      Home Medications    Prior to Admission medications   Medication Sig Start Date End Date Taking? Authorizing Provider  ACCU-CHEK AVIVA PLUS test strip  07/02/18  Yes [provider]  ACCU-CHEK SOFTCLIX LANCETS lancets  07/02/18  Yes [provider]  acetaminophen (TYLENOL) 500 MG tablet Take 1,000 mg by mouth every 8 (eight) hours as needed for moderate pain or headache.    Yes [provider]  aspirin EC 81 MG tablet Take 81 mg by mouth at bedtime.    Yes [provider]  diclofenac (VOLTAREN) 75 MG EC tablet Take 75 mg by mouth 2 (two) times daily.  01/16/18  Yes [provider]  hydrochlorothiazide (HYDRODIURIL) 25 MG tablet Take 25 mg by mouth daily.   Yes [provider]  levothyroxine (SYNTHROID,  LEVOTHROID) 112 MCG tablet Take 1 tablet (112 mcg total) by mouth daily before breakfast. 06/24/18  Yes Nida, Marella Chimes, MD  losartan (COZAAR) 50 MG tablet Take 50 mg by mouth daily.  11/25/16  Yes [provider]  metFORMIN (GLUCOPHAGE) 500 MG tablet Take 500 mg by mouth daily with breakfast.   Yes [provider]  omeprazole (PRILOSEC) 20 MG capsule Take 20 mg by mouth daily.   Yes [provider]  oxyCODONE-acetaminophen (PERCOCET) 5-325 MG tablet Take 1 tablet by mouth every 4 (four) hours as needed. 07/08/18   Nat Christen, MD     Family History Family History  Problem Relation Age of Onset  . Heart disease Mother   . Heart disease Father   . Colon cancer Neg Hx     Social History Social History   Tobacco Use  . Smoking status: Former Smoker    Packs/day: 0.25    Years: 2.00    Pack years: 0.50    Types: Cigarettes    Last attempt to quit: 01/31/2010    Years since quitting: 8.4  . Smokeless tobacco: Never Used  Substance Use Topics  . Alcohol use: No    Alcohol/week: 0.0 standard drinks  . Drug use: No     Allergies   Patient has no known allergies.   Review of Systems Review of Systems  All other systems reviewed and are negative.    Physical Exam Updated Vital Signs BP (!) 150/85   Pulse 85   Temp 98.6 F (37 C) (Oral)   Resp 18   Ht 5\' 4"  (1.626 m)   Wt 69.9 kg   SpO2 99%   BMI 26.43 kg/m   Physical Exam  Constitutional: She is oriented to person, place, and time. She appears well-developed and well-nourished.  HENT:  Head: Normocephalic and atraumatic.  Eyes: Conjunctivae are normal.  Neck: Neck supple.  Cardiovascular: Normal rate and regular rhythm.  Pulmonary/Chest: Effort normal and breath sounds normal.  Abdominal: Soft. Bowel sounds are normal.  Nontender.  Musculoskeletal:  Minimal generalized left hip tenderness  Neurological: She is alert and oriented to person, place, and time.  Skin: Skin is warm and dry.  Psychiatric: She has a normal mood and affect. Her behavior is normal.  Nursing note and vitals reviewed.    ED Treatments / Results  Labs (all labs ordered are listed, but only abnormal results are displayed) Labs Reviewed  CBC WITH DIFFERENTIAL/PLATELET - Abnormal; Notable for the following components:      Result Value   Hemoglobin 15.5 (*)    HCT 46.8 (*)    All other components within normal limits  BASIC METABOLIC PANEL - Abnormal; Notable for the following components:   Potassium 2.9 (*)    All other components within normal limits     EKG None  Radiology No results found.  Procedures Procedures (including critical care time)  Medications Ordered in ED Medications  ondansetron (ZOFRAN) injection 4 mg (4 mg Intravenous Given 07/08/18 1147)  sodium chloride 0.9 % bolus 1,000 mL (0 mLs Intravenous Stopped 07/08/18 1318)  morphine 4 MG/ML injection 4 mg (4 mg Intravenous Given 07/08/18 1148)  potassium chloride SA (K-DUR,KLOR-CON) CR tablet 40 mEq (40 mEq Oral Given 07/08/18 1300)  morphine 4 MG/ML injection 4 mg (4 mg Intravenous Given 07/08/18 1340)     Initial Impression / Assessment and Plan / ED Course  I have reviewed the triage vital signs and the nursing notes.  Pertinent labs &  imaging results that were available during my care of the patient were reviewed by me and considered in my medical decision making (see chart for details).     Patient has persistent left hip pain and diarrhea.  She feels better after IV fluids and pain management.  Discharge medication Percocet (#20).  She can also augment with ibuprofen.  She will continue to take her potassium medication at home  Final Clinical Impressions(s) / ED Diagnoses   Final diagnoses:  Left hip pain  Diarrhea, unspecified type  Hypokalemia    ED Discharge Orders         Ordered    oxyCODONE-acetaminophen (PERCOCET) 5-325 MG tablet  Every 4 hours PRN     07/08/18 1335           Nat Christen, MD 07/08/18 1405

## 2018-07-08 NOTE — ED Triage Notes (Signed)
Pt reports she is scheduled for left hip replacement on 07/21/18 and is in severe pain. Also reports diarrhea and lack of appetite 2 days ago

## 2018-07-14 ENCOUNTER — Other Ambulatory Visit (INDEPENDENT_AMBULATORY_CARE_PROVIDER_SITE_OTHER): Payer: Self-pay | Admitting: Orthopaedic Surgery

## 2018-07-14 DIAGNOSIS — M1612 Unilateral primary osteoarthritis, left hip: Secondary | ICD-10-CM

## 2018-07-14 NOTE — Pre-Procedure Instructions (Signed)
[place]  07/14/2018      [facility] [place], [place] - [id number] [place] [address] Phone: [phone] Fax: [phone]  [facility] - [place], [place] [address] Phone: [phone] Fax: [phone]    Your procedure is scheduled on 07/21/2018.  Report to [facility] Admitting at 1200 P.M.  Call this number if you have problems the morning of surgery:  [phone]   Remember:  Do not eat or drink after midnight.     Take these medicines the morning of surgery with A SIP OF WATER: Acetaminophen (Tylenol) - if needed Levothyroxine (Synthroid) Omeprazole (Prilosec) Oxycodone-acetaminophen (Percocet) - if needed  7 days prior to surgery STOP taking any Diclofenac (Voltaren), Aspirin (unless otherwise instructed by your surgeon), Aleve, Naproxen, Ibuprofen, Motrin, Advil, Goody's, BC's, all herbal medications, fish oil, and all vitamins  Follow your surgeon's instructions on when to stop Asprin.  If no instructions were given by your surgeon then you will need to call the office to get those instructions.     WHAT DO I DO ABOUT MY DIABETES MEDICATION?  Marland Kitchen Do not take oral diabetes medicines (pills) the morning of surgery. - Do not take your metformin the morning of surgery.    How to Manage Your Diabetes Before and After Surgery  Why is it important to control my blood sugar before and after surgery? . Improving blood sugar levels before and after surgery helps healing and can limit problems. . A way of improving blood sugar control is eating a healthy diet by: o  Eating less sugar and carbohydrates o  Increasing activity/exercise o  Talking with your doctor about reaching your blood sugar goals . High blood sugars (greater than 180 mg/dL) can raise your risk of infections and slow your recovery, so you will need to focus on controlling  your diabetes during the weeks before surgery. . Make sure that the doctor who takes care of your diabetes knows about your planned surgery including the date and location.  How do I manage my blood sugar before surgery? . Check your blood sugar at least 4 times a day, starting 2 days before surgery, to make sure that the level is not too high or low. o Check your blood sugar the morning of your surgery when you wake up and every 2 hours until you get to the Short Stay unit. . If your blood sugar is less than 70 mg/dL, you will need to treat for low blood sugar: o Do not take insulin. o Treat a low blood sugar (less than 70 mg/dL) with  cup of clear juice (cranberry or apple), 4 glucose tablets, OR glucose gel. o Recheck blood sugar in 15 minutes after treatment (to make sure it is greater than 70 mg/dL). If your blood sugar is not greater than 70 mg/dL on recheck, call [phone] for further instructions. . Report your blood sugar to the short stay nurse when you get to Short Stay.  . If you are admitted to the hospital after surgery: o Your blood sugar will be checked by the staff and you will probably be given insulin after surgery (instead of oral diabetes medicines) to make sure you have good blood sugar levels. o The goal for blood sugar control after surgery is 80-180 mg/dL.       Do not wear jewelry, make-up or nail polish.  Do not wear lotions, powders, or perfumes, or deodorant.  Do not shave 48 hours prior to surgery.    Do not bring valuables to the hospital.  [facility] is not responsible for any belongings or valuables.  Contacts, Eyeglasses, Hearing Aids, dentures or bridgework may not be worn into surgery.  Leave your suitcase in the car.  After surgery it may be brought to your room.  For patients admitted to the hospital, discharge time will be determined by your treatment team.  Patients discharged the day of surgery will not be allowed to drive home.   Name and  phone number of your driver:    Special instructions:   Kristina Huang- Preparing For Surgery  Before surgery, you can play an important role. Because skin is not sterile, your skin needs to be as free of germs as possible. You can reduce the number of germs on your skin by washing with CHG (chlorahexidine gluconate) Soap before surgery.  CHG is an antiseptic cleaner which kills germs and bonds with the skin to continue killing germs even after washing.    Oral Hygiene is also important to reduce your risk of infection.  Remember - BRUSH YOUR TEETH THE MORNING OF SURGERY WITH YOUR REGULAR TOOTHPASTE  Please do not use if you have an allergy to CHG or antibacterial soaps. If your skin becomes reddened/irritated stop using the CHG.  Do not shave (including legs and underarms) for at least 48 hours prior to first CHG shower. It is OK to shave your face.  Please follow these instructions carefully.   1. Shower the NIGHT BEFORE SURGERY and the MORNING OF SURGERY with CHG.   2. If you chose to wash your hair, wash your hair first as usual with your normal shampoo.  3. After you shampoo, rinse your hair and body thoroughly to remove the shampoo.  4. Use CHG as you would any other liquid soap. You can apply CHG directly to the skin and wash gently with a scrungie or a clean washcloth.   5. Apply the CHG Soap to your body ONLY FROM THE NECK DOWN.  Do not use on open wounds or open sores. Avoid contact with your eyes, ears, mouth and genitals (private parts). Wash Face and genitals (private parts)  with your normal soap.  6. Wash thoroughly, paying special attention to the area where your surgery will be performed.  7. Thoroughly rinse your body with warm water from the neck down.  8. DO NOT shower/wash with your normal soap after using and rinsing off the CHG Soap.  9. Pat yourself dry with a CLEAN TOWEL.  10. Wear CLEAN PAJAMAS to bed the night before surgery, wear comfortable clothes the morning  of surgery  11. Place CLEAN SHEETS on your bed the night of your first shower and DO NOT SLEEP WITH PETS.    Day of Surgery: Shower as stated above. Do not apply any deodorants/lotions.  Please wear clean clothes to the hospital/surgery center.   Remember to brush your teeth WITH YOUR REGULAR TOOTHPASTE.    Please read over the following fact sheets that you were given.

## 2018-07-15 ENCOUNTER — Encounter (HOSPITAL_COMMUNITY): Payer: Self-pay

## 2018-07-15 ENCOUNTER — Encounter (HOSPITAL_COMMUNITY)
Admission: RE | Admit: 2018-07-15 | Discharge: 2018-07-15 | Disposition: A | Discharge status: Home / Self Care | Payer: Medicare PPO | Source: Ambulatory Visit | Attending: Orthopaedic Surgery | Admitting: Orthopaedic Surgery

## 2018-07-15 ENCOUNTER — Other Ambulatory Visit: Payer: Self-pay

## 2018-07-15 DIAGNOSIS — E890 Postprocedural hypothyroidism: Secondary | ICD-10-CM | POA: Diagnosis not present

## 2018-07-15 DIAGNOSIS — Z79890 Hormone replacement therapy (postmenopausal): Secondary | ICD-10-CM | POA: Diagnosis not present

## 2018-07-15 DIAGNOSIS — I10 Essential (primary) hypertension: Secondary | ICD-10-CM | POA: Insufficient documentation

## 2018-07-15 DIAGNOSIS — M1612 Unilateral primary osteoarthritis, left hip: Secondary | ICD-10-CM | POA: Diagnosis not present

## 2018-07-15 DIAGNOSIS — Z7982 Long term (current) use of aspirin: Secondary | ICD-10-CM | POA: Diagnosis not present

## 2018-07-15 DIAGNOSIS — E119 Type 2 diabetes mellitus without complications: Secondary | ICD-10-CM | POA: Diagnosis not present

## 2018-07-15 DIAGNOSIS — Z01818 Encounter for other preprocedural examination: Secondary | ICD-10-CM | POA: Diagnosis not present

## 2018-07-15 DIAGNOSIS — Z7984 Long term (current) use of oral hypoglycemic drugs: Secondary | ICD-10-CM | POA: Insufficient documentation

## 2018-07-15 LAB — GLUCOSE, CAPILLARY: GLUCOSE-CAPILLARY: 94 mg/dL (ref 70–99)

## 2018-07-15 LAB — BASIC METABOLIC PANEL
Anion gap: 13 (ref 5–15)
BUN: 6 mg/dL — AB (ref 8–23)
CO2: 24 mmol/L (ref 22–32)
CREATININE: 0.82 mg/dL (ref 0.44–1.00)
Calcium: 8.9 mg/dL (ref 8.9–10.3)
Chloride: 104 mmol/L (ref 98–111)
GFR calc Af Amer: >60 mL/min (ref >60)
GLUCOSE: 106 mg/dL — AB (ref 70–99)
POTASSIUM: 3.1 mmol/L — AB (ref 3.5–5.1)
Sodium: 141 mmol/L (ref 135–145)

## 2018-07-15 LAB — CBC
HEMATOCRIT: 46.2 % — AB (ref 36.0–46.0)
Hemoglobin: 15 g/dL (ref 12.0–15.0)
MCH: 30.9 pg (ref 26.0–34.0)
MCHC: 32.5 g/dL (ref 30.0–36.0)
MCV: 95.1 fL (ref 80.0–100.0)
Platelets: 350 10*3/uL (ref 150–400)
RBC: 4.86 MIL/uL (ref 3.87–5.11)
RDW: 13 % (ref 11.5–15.5)
WBC: 7.3 10*3/uL (ref 4.0–10.5)
nRBC: 0 % (ref 0.0–0.2)

## 2018-07-15 LAB — SURGICAL PCR SCREEN
MRSA, PCR: NEGATIVE
Staphylococcus aureus: NEGATIVE

## 2018-07-15 NOTE — Progress Notes (Signed)
PCP - Abran Richard MD  Chest x-ray - N/A EKG - 07/15/18  Fasting Blood Sugar - 100-120 Checks Blood Sugar 2-3 times per week  Blood Thinner Instructions: N/A Aspirin Instructions: last dose 07/15/18  Anesthesia review: none  Patient denies shortness of breath, fever, cough and chest pain at PAT appointment   Patient verbalized understanding of instructions that were given to them at the PAT appointment. Patient was also instructed that they will need to review over the PAT instructions again at home before surgery.

## 2018-07-16 ENCOUNTER — Other Ambulatory Visit (INDEPENDENT_AMBULATORY_CARE_PROVIDER_SITE_OTHER): Payer: Self-pay

## 2018-07-20 MED ORDER — TRANEXAMIC ACID-NACL 1000-0.7 MG/100ML-% IV SOLN
1000.0000 mg | INTRAVENOUS | Status: AC
  Administered 2018-07-21: 1000 mg via INTRAVENOUS
  Filled 2018-07-20: qty 100

## 2018-07-21 ENCOUNTER — Encounter (HOSPITAL_COMMUNITY): Payer: Self-pay | Admitting: General Practice

## 2018-07-21 ENCOUNTER — Encounter (HOSPITAL_COMMUNITY)
Admission: RE | Disposition: A | Discharge status: Home / Self Care | Payer: Self-pay | Source: Home / Self Care | Attending: Orthopaedic Surgery

## 2018-07-21 ENCOUNTER — Inpatient Hospital Stay (HOSPITAL_COMMUNITY): Payer: Medicare PPO

## 2018-07-21 ENCOUNTER — Inpatient Hospital Stay (HOSPITAL_COMMUNITY)
Admission: RE | Admit: 2018-07-21 | Discharge: 2018-07-24 | DRG: 470 | Disposition: A | Discharge status: Home / Self Care | Payer: Medicare PPO | Attending: Orthopaedic Surgery | Admitting: Orthopaedic Surgery

## 2018-07-21 ENCOUNTER — Inpatient Hospital Stay (HOSPITAL_COMMUNITY): Payer: Medicare PPO | Admitting: Certified Registered Nurse Anesthetist

## 2018-07-21 ENCOUNTER — Other Ambulatory Visit: Payer: Self-pay

## 2018-07-21 DIAGNOSIS — Z79890 Hormone replacement therapy (postmenopausal): Secondary | ICD-10-CM | POA: Diagnosis not present

## 2018-07-21 DIAGNOSIS — E7800 Pure hypercholesterolemia, unspecified: Secondary | ICD-10-CM | POA: Diagnosis present

## 2018-07-21 DIAGNOSIS — Z8249 Family history of ischemic heart disease and other diseases of the circulatory system: Secondary | ICD-10-CM | POA: Diagnosis not present

## 2018-07-21 DIAGNOSIS — Z79899 Other long term (current) drug therapy: Secondary | ICD-10-CM

## 2018-07-21 DIAGNOSIS — Z7984 Long term (current) use of oral hypoglycemic drugs: Secondary | ICD-10-CM

## 2018-07-21 DIAGNOSIS — Z981 Arthrodesis status: Secondary | ICD-10-CM

## 2018-07-21 DIAGNOSIS — M87852 Other osteonecrosis, left femur: Secondary | ICD-10-CM | POA: Diagnosis present

## 2018-07-21 DIAGNOSIS — E890 Postprocedural hypothyroidism: Secondary | ICD-10-CM | POA: Diagnosis present

## 2018-07-21 DIAGNOSIS — I10 Essential (primary) hypertension: Secondary | ICD-10-CM | POA: Diagnosis present

## 2018-07-21 DIAGNOSIS — M1612 Unilateral primary osteoarthritis, left hip: Principal | ICD-10-CM

## 2018-07-21 DIAGNOSIS — Z87891 Personal history of nicotine dependence: Secondary | ICD-10-CM

## 2018-07-21 DIAGNOSIS — Z85850 Personal history of malignant neoplasm of thyroid: Secondary | ICD-10-CM

## 2018-07-21 DIAGNOSIS — M160 Bilateral primary osteoarthritis of hip: Secondary | ICD-10-CM | POA: Diagnosis present

## 2018-07-21 DIAGNOSIS — E119 Type 2 diabetes mellitus without complications: Secondary | ICD-10-CM | POA: Diagnosis present

## 2018-07-21 DIAGNOSIS — Z96642 Presence of left artificial hip joint: Secondary | ICD-10-CM

## 2018-07-21 DIAGNOSIS — K219 Gastro-esophageal reflux disease without esophagitis: Secondary | ICD-10-CM | POA: Diagnosis present

## 2018-07-21 DIAGNOSIS — Z419 Encounter for procedure for purposes other than remedying health state, unspecified: Secondary | ICD-10-CM

## 2018-07-21 HISTORY — PX: TOTAL HIP ARTHROPLASTY: SHX124

## 2018-07-21 LAB — GLUCOSE, CAPILLARY
GLUCOSE-CAPILLARY: 91 mg/dL (ref 70–99)
Glucose-Capillary: 141 mg/dL — ABNORMAL HIGH (ref 70–99)
Glucose-Capillary: 145 mg/dL — ABNORMAL HIGH (ref 70–99)

## 2018-07-21 SURGERY — ARTHROPLASTY, HIP, TOTAL, ANTERIOR APPROACH
Anesthesia: General | Site: Hip | Laterality: Left

## 2018-07-21 MED ORDER — OXYCODONE HCL 5 MG PO TABS
ORAL_TABLET | ORAL | Status: AC
  Filled 2018-07-21: qty 1

## 2018-07-21 MED ORDER — OXYCODONE HCL 5 MG PO TABS
5.0000 mg | ORAL_TABLET | Freq: Once | ORAL | Status: AC | PRN
  Administered 2018-07-21: 5 mg via ORAL

## 2018-07-21 MED ORDER — ONDANSETRON HCL 4 MG/2ML IJ SOLN
INTRAMUSCULAR | Status: AC
  Filled 2018-07-21: qty 2

## 2018-07-21 MED ORDER — HYDROCHLOROTHIAZIDE 25 MG PO TABS
25.0000 mg | ORAL_TABLET | Freq: Every day | ORAL | Status: DC
  Administered 2018-07-21 – 2018-07-24 (×4): 25 mg via ORAL
  Filled 2018-07-21 (×4): qty 1

## 2018-07-21 MED ORDER — METHOCARBAMOL 500 MG PO TABS
500.0000 mg | ORAL_TABLET | Freq: Four times a day (QID) | ORAL | Status: DC | PRN
  Administered 2018-07-21 – 2018-07-24 (×6): 500 mg via ORAL
  Filled 2018-07-21 (×5): qty 1

## 2018-07-21 MED ORDER — POLYETHYLENE GLYCOL 3350 17 G PO PACK: 17.0000 g | PACK | Freq: Every day | ORAL | Status: DC | PRN

## 2018-07-21 MED ORDER — METOCLOPRAMIDE HCL 5 MG PO TABS: 5.0000 mg | ORAL_TABLET | Freq: Three times a day (TID) | ORAL | Status: DC | PRN

## 2018-07-21 MED ORDER — METHOCARBAMOL 500 MG PO TABS
ORAL_TABLET | ORAL | Status: AC
  Filled 2018-07-21: qty 1

## 2018-07-21 MED ORDER — DEXAMETHASONE SODIUM PHOSPHATE 10 MG/ML IJ SOLN
INTRAMUSCULAR | Status: AC
  Filled 2018-07-21: qty 1

## 2018-07-21 MED ORDER — 0.9 % SODIUM CHLORIDE (POUR BTL) OPTIME
TOPICAL | Status: DC | PRN
  Administered 2018-07-21: 1000 mL

## 2018-07-21 MED ORDER — SODIUM CHLORIDE 0.9 % IR SOLN
Status: DC | PRN
  Administered 2018-07-21: 1

## 2018-07-21 MED ORDER — LACTATED RINGERS IV SOLN
INTRAVENOUS | Status: DC
  Administered 2018-07-21: 13:00:00 via INTRAVENOUS

## 2018-07-21 MED ORDER — PANTOPRAZOLE SODIUM 40 MG PO TBEC
40.0000 mg | DELAYED_RELEASE_TABLET | Freq: Every day | ORAL | Status: DC
  Administered 2018-07-22 – 2018-07-24 (×3): 40 mg via ORAL
  Filled 2018-07-21 (×3): qty 1

## 2018-07-21 MED ORDER — PROPOFOL 500 MG/50ML IV EMUL
INTRAVENOUS | Status: DC | PRN
  Administered 2018-07-21: 50 ug/kg/min via INTRAVENOUS

## 2018-07-21 MED ORDER — FENTANYL CITRATE (PF) 250 MCG/5ML IJ SOLN
INTRAMUSCULAR | Status: AC
  Filled 2018-07-21: qty 5

## 2018-07-21 MED ORDER — ONDANSETRON HCL 4 MG/2ML IJ SOLN
INTRAMUSCULAR | Status: DC | PRN
  Administered 2018-07-21: 4 mg via INTRAVENOUS

## 2018-07-21 MED ORDER — HYDROMORPHONE HCL 1 MG/ML IJ SOLN
0.5000 mg | INTRAMUSCULAR | Status: DC | PRN
  Administered 2018-07-21 – 2018-07-22 (×4): 1 mg via INTRAVENOUS
  Filled 2018-07-21 (×5): qty 1

## 2018-07-21 MED ORDER — METFORMIN HCL 500 MG PO TABS
500.0000 mg | ORAL_TABLET | Freq: Every day | ORAL | Status: DC
  Administered 2018-07-22 – 2018-07-24 (×3): 500 mg via ORAL
  Filled 2018-07-21 (×3): qty 1

## 2018-07-21 MED ORDER — DOCUSATE SODIUM 100 MG PO CAPS
100.0000 mg | ORAL_CAPSULE | Freq: Two times a day (BID) | ORAL | Status: DC
  Administered 2018-07-21 – 2018-07-24 (×6): 100 mg via ORAL
  Filled 2018-07-21 (×6): qty 1

## 2018-07-21 MED ORDER — LIDOCAINE 2% (20 MG/ML) 5 ML SYRINGE
INTRAMUSCULAR | Status: AC
  Filled 2018-07-21: qty 5

## 2018-07-21 MED ORDER — INSULIN ASPART 100 UNIT/ML ~~LOC~~ SOLN
0.0000 [IU] | Freq: Three times a day (TID) | SUBCUTANEOUS | Status: DC
  Administered 2018-07-22: 2 [IU] via SUBCUTANEOUS
  Administered 2018-07-22: 1 [IU] via SUBCUTANEOUS
  Administered 2018-07-23: 2 [IU] via SUBCUTANEOUS
  Administered 2018-07-24: 3 [IU] via SUBCUTANEOUS
  Administered 2018-07-24: 2 [IU] via SUBCUTANEOUS

## 2018-07-21 MED ORDER — DEXMEDETOMIDINE HCL 200 MCG/2ML IV SOLN
INTRAVENOUS | Status: DC | PRN
  Administered 2018-07-21: 12 ug via INTRAVENOUS
  Administered 2018-07-21: 8 ug via INTRAVENOUS

## 2018-07-21 MED ORDER — PHENYLEPHRINE 40 MCG/ML (10ML) SYRINGE FOR IV PUSH (FOR BLOOD PRESSURE SUPPORT)
PREFILLED_SYRINGE | INTRAVENOUS | Status: DC | PRN
  Administered 2018-07-21: 120 ug via INTRAVENOUS
  Administered 2018-07-21: 80 ug via INTRAVENOUS

## 2018-07-21 MED ORDER — CEFAZOLIN SODIUM-DEXTROSE 2-4 GM/100ML-% IV SOLN
2.0000 g | INTRAVENOUS | Status: AC
  Administered 2018-07-21: 2 g via INTRAVENOUS
  Filled 2018-07-21: qty 100

## 2018-07-21 MED ORDER — FENTANYL CITRATE (PF) 100 MCG/2ML IJ SOLN
INTRAMUSCULAR | Status: AC
  Filled 2018-07-21: qty 2

## 2018-07-21 MED ORDER — FENTANYL CITRATE (PF) 100 MCG/2ML IJ SOLN
25.0000 ug | INTRAMUSCULAR | Status: DC | PRN
  Administered 2018-07-21 (×2): 25 ug via INTRAVENOUS
  Administered 2018-07-21: 50 ug via INTRAVENOUS
  Administered 2018-07-21 (×2): 25 ug via INTRAVENOUS

## 2018-07-21 MED ORDER — DIPHENHYDRAMINE HCL 12.5 MG/5ML PO ELIX: 12.5000 mg | ORAL_SOLUTION | ORAL | Status: DC | PRN

## 2018-07-21 MED ORDER — ACETAMINOPHEN 325 MG PO TABS
325.0000 mg | ORAL_TABLET | Freq: Four times a day (QID) | ORAL | Status: DC | PRN
  Administered 2018-07-21 – 2018-07-23 (×3): 650 mg via ORAL
  Filled 2018-07-21 (×3): qty 2

## 2018-07-21 MED ORDER — ROCURONIUM BROMIDE 50 MG/5ML IV SOSY
PREFILLED_SYRINGE | INTRAVENOUS | Status: AC
  Filled 2018-07-21: qty 5

## 2018-07-21 MED ORDER — MENTHOL 3 MG MT LOZG: 1.0000 | LOZENGE | OROMUCOSAL | Status: DC | PRN

## 2018-07-21 MED ORDER — ONDANSETRON HCL 4 MG PO TABS: 4.0000 mg | ORAL_TABLET | Freq: Four times a day (QID) | ORAL | Status: DC | PRN

## 2018-07-21 MED ORDER — ALUM & MAG HYDROXIDE-SIMETH 200-200-20 MG/5ML PO SUSP: 30.0000 mL | ORAL | Status: DC | PRN

## 2018-07-21 MED ORDER — OXYCODONE HCL 5 MG PO TABS
5.0000 mg | ORAL_TABLET | ORAL | Status: DC | PRN
  Administered 2018-07-21 – 2018-07-24 (×3): 10 mg via ORAL
  Filled 2018-07-21 (×4): qty 2

## 2018-07-21 MED ORDER — LACTATED RINGERS IV SOLN
INTRAVENOUS | Status: DC | PRN
  Administered 2018-07-21 (×2): via INTRAVENOUS

## 2018-07-21 MED ORDER — OXYCODONE HCL 5 MG PO TABS
10.0000 mg | ORAL_TABLET | ORAL | Status: DC | PRN
  Administered 2018-07-22 – 2018-07-23 (×6): 15 mg via ORAL
  Administered 2018-07-24: 10 mg via ORAL
  Filled 2018-07-21 (×6): qty 3

## 2018-07-21 MED ORDER — METOCLOPRAMIDE HCL 5 MG/ML IJ SOLN
5.0000 mg | Freq: Three times a day (TID) | INTRAMUSCULAR | Status: DC | PRN
  Administered 2018-07-22: 10 mg via INTRAVENOUS
  Filled 2018-07-21: qty 2

## 2018-07-21 MED ORDER — ESMOLOL HCL 100 MG/10ML IV SOLN
INTRAVENOUS | Status: DC | PRN
  Administered 2018-07-21: 30 mg via INTRAVENOUS
  Administered 2018-07-21: 20 mg via INTRAVENOUS
  Administered 2018-07-21: 30 mg via INTRAVENOUS

## 2018-07-21 MED ORDER — ONDANSETRON HCL 4 MG/2ML IJ SOLN
4.0000 mg | Freq: Four times a day (QID) | INTRAMUSCULAR | Status: DC | PRN
  Administered 2018-07-22 – 2018-07-23 (×3): 4 mg via INTRAVENOUS
  Filled 2018-07-21 (×3): qty 2

## 2018-07-21 MED ORDER — LOSARTAN POTASSIUM 50 MG PO TABS
50.0000 mg | ORAL_TABLET | Freq: Every day | ORAL | Status: DC
  Administered 2018-07-21 – 2018-07-24 (×4): 50 mg via ORAL
  Filled 2018-07-21 (×4): qty 1

## 2018-07-21 MED ORDER — SODIUM CHLORIDE 0.9 % IV SOLN
INTRAVENOUS | Status: DC
  Administered 2018-07-21: 17:00:00 via INTRAVENOUS

## 2018-07-21 MED ORDER — FENTANYL CITRATE (PF) 100 MCG/2ML IJ SOLN
INTRAMUSCULAR | Status: DC | PRN
  Administered 2018-07-21: 50 ug via INTRAVENOUS
  Administered 2018-07-21: 25 ug via INTRAVENOUS
  Administered 2018-07-21 (×2): 50 ug via INTRAVENOUS
  Administered 2018-07-21: 100 ug via INTRAVENOUS
  Administered 2018-07-21: 50 ug via INTRAVENOUS
  Administered 2018-07-21: 75 ug via INTRAVENOUS
  Administered 2018-07-21: 50 ug via INTRAVENOUS

## 2018-07-21 MED ORDER — OXYCODONE HCL 5 MG/5ML PO SOLN: 5.0000 mg | Freq: Once | ORAL | Status: AC | PRN

## 2018-07-21 MED ORDER — SUGAMMADEX SODIUM 200 MG/2ML IV SOLN
INTRAVENOUS | Status: DC | PRN
  Administered 2018-07-21: 200 mg via INTRAVENOUS

## 2018-07-21 MED ORDER — LIDOCAINE 2% (20 MG/ML) 5 ML SYRINGE
INTRAMUSCULAR | Status: DC | PRN
  Administered 2018-07-21: 100 mg via INTRAVENOUS

## 2018-07-21 MED ORDER — METHOCARBAMOL 1000 MG/10ML IJ SOLN
500.0000 mg | Freq: Four times a day (QID) | INTRAVENOUS | Status: DC | PRN
  Filled 2018-07-21: qty 5

## 2018-07-21 MED ORDER — ROCURONIUM BROMIDE 10 MG/ML (PF) SYRINGE
PREFILLED_SYRINGE | INTRAVENOUS | Status: DC | PRN
  Administered 2018-07-21: 50 mg via INTRAVENOUS
  Administered 2018-07-21: 20 mg via INTRAVENOUS

## 2018-07-21 MED ORDER — ONDANSETRON HCL 4 MG/2ML IJ SOLN: 4.0000 mg | Freq: Once | INTRAMUSCULAR | Status: DC | PRN

## 2018-07-21 MED ORDER — DEXAMETHASONE SODIUM PHOSPHATE 10 MG/ML IJ SOLN
INTRAMUSCULAR | Status: DC | PRN
  Administered 2018-07-21: 4 mg via INTRAVENOUS

## 2018-07-21 MED ORDER — CEFAZOLIN SODIUM-DEXTROSE 1-4 GM/50ML-% IV SOLN
1.0000 g | Freq: Four times a day (QID) | INTRAVENOUS | Status: AC
  Administered 2018-07-21 – 2018-07-22 (×2): 1 g via INTRAVENOUS
  Filled 2018-07-21 (×2): qty 50

## 2018-07-21 MED ORDER — ASPIRIN 81 MG PO CHEW
81.0000 mg | CHEWABLE_TABLET | Freq: Two times a day (BID) | ORAL | Status: DC
  Administered 2018-07-22 – 2018-07-24 (×5): 81 mg via ORAL
  Filled 2018-07-21 (×6): qty 1

## 2018-07-21 MED ORDER — PROPOFOL 10 MG/ML IV BOLUS
INTRAVENOUS | Status: DC | PRN
  Administered 2018-07-21: 160 mg via INTRAVENOUS

## 2018-07-21 MED ORDER — LEVOTHYROXINE SODIUM 112 MCG PO TABS
112.0000 ug | ORAL_TABLET | Freq: Every day | ORAL | Status: DC
  Administered 2018-07-22 – 2018-07-24 (×3): 112 ug via ORAL
  Filled 2018-07-21 (×3): qty 1

## 2018-07-21 MED ORDER — PHENOL 1.4 % MT LIQD: 1.0000 | OROMUCOSAL | Status: DC | PRN

## 2018-07-21 SURGICAL SUPPLY — 60 items
APL SKNCLS STERI-STRIP NONHPOA (GAUZE/BANDAGES/DRESSINGS) ×1
BENZOIN TINCTURE PRP APPL 2/3 (GAUZE/BANDAGES/DRESSINGS) ×3 IMPLANT
BLADE CLIPPER SURG (BLADE) IMPLANT
BLADE SAW SGTL 18X1.27X75 (BLADE) ×2 IMPLANT
BLADE SAW SGTL 18X1.27X75MM (BLADE) ×1
CLOSURE WOUND 1/2 X4 (GAUZE/BANDAGES/DRESSINGS) ×2
COVER SURGICAL LIGHT HANDLE (MISCELLANEOUS) ×3 IMPLANT
COVER WAND RF STERILE (DRAPES) ×3 IMPLANT
CUP SECTOR GRIPTON 50MM (Cup) ×3 IMPLANT
DRAPE C-ARM 42X72 X-RAY (DRAPES) ×3 IMPLANT
DRAPE STERI IOBAN 125X83 (DRAPES) ×3 IMPLANT
DRAPE U-SHAPE 47X51 STRL (DRAPES) ×9 IMPLANT
DRESSING AQUACEL AG SP 3.5X10 (GAUZE/BANDAGES/DRESSINGS) ×1 IMPLANT
DRSG AQUACEL AG ADV 3.5X10 (GAUZE/BANDAGES/DRESSINGS) ×3 IMPLANT
DRSG AQUACEL AG SP 3.5X10 (GAUZE/BANDAGES/DRESSINGS) ×3
DURAPREP 26ML APPLICATOR (WOUND CARE) ×3 IMPLANT
ELECT BLADE 4.0 EZ CLEAN MEGAD (MISCELLANEOUS) ×3
ELECT BLADE 6.5 EXT (BLADE) IMPLANT
ELECT REM PT RETURN 9FT ADLT (ELECTROSURGICAL) ×3
ELECTRODE BLDE 4.0 EZ CLN MEGD (MISCELLANEOUS) ×1 IMPLANT
ELECTRODE REM PT RTRN 9FT ADLT (ELECTROSURGICAL) ×1 IMPLANT
FACESHIELD WRAPAROUND (MASK) ×6 IMPLANT
GAUZE XEROFORM 1X8 LF (GAUZE/BANDAGES/DRESSINGS) ×3 IMPLANT
GLOVE BIOGEL PI IND STRL 8 (GLOVE) ×2 IMPLANT
GLOVE BIOGEL PI INDICATOR 8 (GLOVE) ×4
GLOVE ECLIPSE 8.0 STRL XLNG CF (GLOVE) ×3 IMPLANT
GLOVE ORTHO TXT STRL SZ7.5 (GLOVE) ×6 IMPLANT
GOWN STRL REUS W/ TWL LRG LVL3 (GOWN DISPOSABLE) ×2 IMPLANT
GOWN STRL REUS W/ TWL XL LVL3 (GOWN DISPOSABLE) ×2 IMPLANT
GOWN STRL REUS W/TWL LRG LVL3 (GOWN DISPOSABLE) ×6
GOWN STRL REUS W/TWL XL LVL3 (GOWN DISPOSABLE) ×6
HANDPIECE INTERPULSE COAX TIP (DISPOSABLE) ×2
HEAD FEM STD 32X+1 STRL (Hips) ×3 IMPLANT
KIT BASIN OR (CUSTOM PROCEDURE TRAY) ×3 IMPLANT
KIT TURNOVER KIT B (KITS) ×3 IMPLANT
LINER ACET PNNCL PLUS4 NEUTRAL (Hips) ×1 IMPLANT
MANIFOLD NEPTUNE II (INSTRUMENTS) ×3 IMPLANT
NS IRRIG 1000ML POUR BTL (IV SOLUTION) ×3 IMPLANT
PACK TOTAL JOINT (CUSTOM PROCEDURE TRAY) ×3 IMPLANT
PAD ARMBOARD 7.5X6 YLW CONV (MISCELLANEOUS) ×3 IMPLANT
PINNACLE PLUS 4 NEUTRAL (Hips) ×3 IMPLANT
SCREW 6.5MMX25MM (Screw) ×3 IMPLANT
SET HNDPC FAN SPRY TIP SCT (DISPOSABLE) ×1 IMPLANT
STAPLER VISISTAT 35W (STAPLE) IMPLANT
STEM CORAIL KA11 (Stem) ×3 IMPLANT
STRIP CLOSURE SKIN 1/2X4 (GAUZE/BANDAGES/DRESSINGS) ×4 IMPLANT
SUT ETHIBOND NAB CT1 #1 30IN (SUTURE) ×3 IMPLANT
SUT MNCRL AB 4-0 PS2 18 (SUTURE) IMPLANT
SUT VIC AB 0 CT1 27 (SUTURE) ×3
SUT VIC AB 0 CT1 27XBRD ANBCTR (SUTURE) ×1 IMPLANT
SUT VIC AB 1 CT1 27 (SUTURE) ×3
SUT VIC AB 1 CT1 27XBRD ANBCTR (SUTURE) ×1 IMPLANT
SUT VIC AB 2-0 CT1 27 (SUTURE) ×3
SUT VIC AB 2-0 CT1 TAPERPNT 27 (SUTURE) ×1 IMPLANT
TOWEL OR 17X24 6PK STRL BLUE (TOWEL DISPOSABLE) ×3 IMPLANT
TOWEL OR 17X26 10 PK STRL BLUE (TOWEL DISPOSABLE) ×3 IMPLANT
TRAY CATH 16FR W/PLASTIC CATH (SET/KITS/TRAYS/PACK) IMPLANT
TRAY FOLEY CATH 14FR (SET/KITS/TRAYS/PACK) ×3 IMPLANT
TRAY FOLEY MTR SLVR 16FR STAT (SET/KITS/TRAYS/PACK) IMPLANT
WATER STERILE IRR 1000ML POUR (IV SOLUTION) ×6 IMPLANT

## 2018-07-21 NOTE — Brief Op Note (Signed)
07/21/2018  3:27 PM  PATIENT:  Kristina Huang  72 y.o. female  PRE-OPERATIVE DIAGNOSIS:  endstage osteoarthritis left hip  POST-OPERATIVE DIAGNOSIS:  endstage osteoarthritis left hip  PROCEDURE:  Procedure(s): LEFT TOTAL HIP ARTHROPLASTY ANTERIOR APPROACH (Left)  SURGEON:  Surgeon(s) and Role:    * Mcarthur Rossetti, MD - Primary  PHYSICIAN ASSISTANT:  Benita Stabile, PA-C  ANESTHESIA:   general  EBL:  350 mL    COUNTS:  YES  DICTATION: .Other Dictation: Dictation Number [id number]  PLAN OF CARE: Admit to inpatient   PATIENT DISPOSITION:  PACU - hemodynamically stable.   Delay start of Pharmacological VTE agent (>24hrs) due to surgical blood loss or risk of bleeding: no

## 2018-07-21 NOTE — Op Note (Signed)
NAME: Kristina Huang, Kristina Huang MEDICAL RECORD [id number] ACCOUNT [id number] DATE OF BIRTH:January 22, 1946 FACILITY: [facility] LOCATION: [facility] PHYSICIAN:Joyleen Haselton Kerry Fort, MD  OPERATIVE REPORT  DATE OF PROCEDURE:  07/21/2018  PREOPERATIVE DIAGNOSIS:  Severe osteoarthritis and degenerative joint disease, left hip.  POSTOPERATIVE DIAGNOSIS:  Severe end-stage avascular necrosis, left hip.  PROCEDURE:  Left total hip arthroplasty through direct anterior approach.  IMPLANTS:  DePuy Sector Gription acetabular component size 50, with a single screw, size 32+4 neutral polyethylene liner, size 11 Corail femoral component with standard offset, size 32+1 metal hip ball, 1 screw in the acetabular component.  SURGEON:  Lind Guest. Ninfa Linden, MD  ASSISTANT:  Erskine Emery, PA-C  ANESTHESIA:  General.  ANTIBIOTICS:  Two grams IV Ancef.  ESTIMATED BLOOD LOSS:  100 mL.  COMPLICATIONS:  None.  INDICATIONS:  The patient is a very pleasant and active 72 year old female with debilitating hip disease on both her hips.  X-ray-wise, it looks like a combination of osteoarthritis and osteonecrosis.  I lean more toward it being avascular necrosis.  Her  hip pain is daily and has detrimentally affected activities of daily living, her quality of life, her mobility.  Both hips are severely diseased based on plain films and clinical exam.  Her left hurts much worse than her right.  She wants me to perform  both hips at once, but I have told her that at her age of 89 and the fact that this was likely avascular necrosis and those patients tend to sometimes have more bleeding, that I would only proceed with a single hip.  She decided to have the left hip  done.  I had a long and thorough discussion about the risk of acute blood loss anemia, nerve or vessel injury, fracture, infection, dislocation, DVT and implant failure.  She understands our goals are to decrease pain, improve mobility and overall  improve quality of  life.  DESCRIPTION OF PROCEDURE:  After informed consent was obtained and appropriate left hip was marked, she was brought to the operating room and general anesthesia was obtained while she was on a stretcher.  The Foley catheter was placed.  Both feet had  traction boots applied to them.  Next, she was placed supine on the Hana fracture table, the perineal post in place and both legs in in-line skeletal traction device and no traction applied.  Her left operative hip was prepped and draped with DuraPrep  and sterile drapes.  A time-out was called, and she was identified as correct patient, correct left hip.  I then made an incision just inferior and posterior to the anterior superior iliac spine and carried this obliquely down the leg.  I dissected down  tensor fascia lata muscle.  Tensor fascia was then divided longitudinally to proceed with direct anterior approach to the hip.  I identified and cauterized circumflex vessels and identified the hip capsule, elevated the hip capsule in an L-type format,  finding a moderate joint effusion and significant disease around her left hip.  We placed Cobra retractors around the medial and lateral femoral neck and made our femoral neck cut with an oscillating saw proximal to the lesser trochanter and completed  this with an osteotome.  We placed a corkscrew guide in the femoral head and removed the femoral head in its entirety and found areas devoid of cartilage but also cartilage that was flaking off consistent more with avascular necrosis.  I then placed a  bent Hohmann over the medial acetabular rim and removed remnants of  the acetabular labrum and other debris.  We then began reaming under direct visualization from a size 44 reamer, going in stepwise increments to a size 49 with all reamers under direct  visualization, the last reamer under direct fluoroscopy, so we could obtain our depth of reaming, our inclination and anteversion.  We then placed the real  DePuy Sector Gription acetabular component size 50 and a single screw and a 32+4 polyethylene  liner for that size acetabular component.  Attention was then turned to the femur.  With the leg externally rotated to 120 degrees, extended and adducted, we were able to place a Mueller retractor medially and Hohmann retractor above the greater  trochanter, released our joint capsule and used a box-cutting osteotome to enter the femoral canal and a rongeur to lateralize.  We then began broaching using the Corail broaching system from a size 8, going up to a size 11.  With a size 11 in place, we  trialed a standard offset femoral neck and a 32+1 hip ball and reduced this in the acetabulum.  We felt like she was stable for sure, but it did feel like she was tight with a longer leg.  There is nothing that we would do about this in this setting,  understanding that we will be able to replace her other hip and lengthen her on that side to match her.  We then dislocated the hip and removed the trial components.  We placed the real Corail femoral component size 11 with standard offset and the real  32+1 metal hip ball, reduced this in the acetabulum, and it was stable.  We then irrigated the soft tissues with normal saline solution using pulsatile lavage.  We closed the joint capsule with interrupted #1 Ethibond suture, followed by running 0 Vicryl  and tensor fascia, 0 Vicryl in the deep tissue, 2-0 Vicryl subcutaneous tissue and interrupted staples on the skin.  Xeroform and Aquacel dressing was applied.  She was taken off the Hana table, awakened, extubated, and taken to recovery room in stable  condition.  All final counts were correct.  There were no complications noted.  Of note, Benita Stabile, PA-C, assisted the entire case.  Assistance was crucial for facilitating all aspects of this case.  LN/NUANCE  D:07/21/2018 T:07/21/2018 JOB:[id number]

## 2018-07-21 NOTE — Transfer of Care (Signed)
Immediate Anesthesia Transfer of Care Note  Patient: Kristina Huang  Procedure(s) Performed: LEFT TOTAL HIP ARTHROPLASTY ANTERIOR APPROACH (Left Hip)  Patient Location: PACU  Anesthesia Type:General  Level of Consciousness: awake and alert   Airway & Oxygen Therapy: Patient Spontanous Breathing and Patient connected to nasal cannula oxygen  Post-op Assessment: Report given to RN and Post -op Vital signs reviewed and stable  Post vital signs: Reviewed and stable  Last Vitals:  Vitals Value Taken Time  BP 140/76 07/21/2018  3:44 PM  Temp    Pulse 101 07/21/2018  3:51 PM  Resp 20 07/21/2018  3:51 PM  SpO2 94 % 07/21/2018  3:51 PM  Vitals shown include unvalidated device data.  Last Pain:  Vitals:   07/21/18 1223  TempSrc:   PainSc: 10-Worst pain ever      Patients Stated Pain Goal: 4 (82/42/35 3614)  Complications: No apparent anesthesia complications

## 2018-07-21 NOTE — H&P (Signed)
TOTAL HIP ADMISSION H&P  Patient is admitted for left total hip arthroplasty.  Subjective:  Chief Complaint: left hip pain  HPI: Kristina Huang, 72 y.o. female, has a history of pain and functional disability in the bilaterally hip(s) due to arthritis and patient has failed non-surgical conservative treatments for greater than 12 weeks to include NSAID's and/or analgesics, corticosteriod injections, flexibility and strengthening excercises, use of assistive devices and activity modification.  Onset of symptoms was gradual starting 5 years ago with rapidlly worsening course since that time.The patient noted no past surgery on the bilaterally hip(s).  Patient currently rates pain in the left hip at 10 out of 10 with activity. Patient has night pain, worsening of pain with activity and weight bearing, trendelenberg gait, pain that interfers with activities of daily living, pain with passive range of motion and crepitus. Patient has evidence of subchondral cysts, subchondral sclerosis, periarticular osteophytes and joint space narrowing by imaging studies. This condition presents safety issues increasing the risk of falls.  There is no current active infection.  Patient Active Problem List   Diagnosis Date Noted  . Unilateral primary osteoarthritis, left hip 07/21/2018  . Malignant neoplasm of thyroid gland ([place]) 03/18/2017  . 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 Multinodular goiter 12/10/2016  . Personal history of colonic polyps 08/23/2014  . GERD (gastroesophageal reflux disease) 08/23/2014  . Chronic diarrhea 08/23/2014  . Spondylolisthesis at L3-L4 level 11/17/2013  . COLONIC POLYPS, HYPERPLASTIC 02/03/2009  . HYPERLIPIDEMIA 02/03/2009  . HYPERTENSION 02/03/2009  . GERD 02/03/2009  . ARTHRITIS 02/03/2009   Past Medical History:  Diagnosis Date  . Acid reflux   . Arthritis   . Cancer ([place])    Thyroid  . Diabetes mellitus without complication (HCC)    fasting 100-130  .  Hypercholesterolemia   . Hypertension   . 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 BACK SURGERY     spinal fusion  . BUNIONECTOMY Bilateral   . CHOLECYSTECTOMY     Dr. Irving Shows  . COLONOSCOPY  2009   Dr. Oneida Alar: moderate internal hemorrhoids, frequent sigmoid colon diverticula, polyps X 3, one large adenoma otherwise hyperplastic  . COLONOSCOPY N/A 09/09/2014   Procedure: COLONOSCOPY;  Surgeon: Danie Binder, MD;  Location: [facility];  Service: Endoscopy;  Laterality: N/A;  200 - moved to 1/29 @ 12:45  . COLONOSCOPY N/A 10/24/2017   Procedure: COLONOSCOPY;  Surgeon: Danie Binder, MD;  Location: [facility];  Service: Endoscopy;  Laterality: N/A;  9:30  . ESOPHAGOGASTRODUODENOSCOPY N/A 09/09/2014   Procedure: ESOPHAGOGASTRODUODENOSCOPY (EGD);  Surgeon: Danie Binder, MD;  Location: [facility];  Service: Endoscopy;  Laterality: N/A;  . EYE SURGERY Right   . LAMINECTOMY  1997  . SPINAL FUSION  2015  . THYROIDECTOMY N/A 02/05/2017   Procedure: THYROIDECTOMY;  Surgeon: Aviva Signs, MD;  Location: [facility];  Service: General;  Laterality: N/A;  . TUBAL LIGATION      Current Facility-Administered Medications  Medication Dose Route Frequency Provider Last Rate Last Dose  . ceFAZolin (ANCEF) IVPB 2g/100 mL premix  2 g Intravenous On Call to [place], MD      . lactated ringers infusion   Intravenous Continuous Lidia Collum, MD 10 mL/hr at 07/21/18 1231    . tranexamic acid (CYKLOKAPRON) IVPB 1,000 mg  1,000 mg Intravenous To OR Mcarthur Rossetti, MD  No Known Allergies  Social History   Tobacco Use  . Smoking status: Former Smoker    Packs/day: 0.25    Years: 2.00    Pack years: 0.50    Types: Cigarettes    Last attempt to quit: 01/31/2010    Years since quitting: 8.4  . Smokeless tobacco: Never Used  Substance Use Topics  . Alcohol use: No    Alcohol/week: 0.0  standard drinks    Family History  Problem Relation Age of Onset  . Heart disease Mother   . Heart disease Father   . Colon cancer Neg Hx      Review of Systems  Musculoskeletal: Positive for back pain and joint pain.  All other systems reviewed and are negative.   Objective:  Physical Exam  Constitutional: She is oriented to person, place, and time. She appears well-developed and well-nourished.  HENT:  Head: Normocephalic and atraumatic.  Eyes: Pupils are equal, round, and reactive to light. EOM are normal.  Neck: Normal range of motion.  Cardiovascular: Normal rate.  Respiratory: Effort normal.  GI: Soft.  Musculoskeletal:       Right hip: She exhibits decreased range of motion, decreased strength, tenderness and bony tenderness.       Left hip: She exhibits decreased range of motion, decreased strength, tenderness and bony tenderness.  Neurological: She is alert and oriented to person, place, and time.  Skin: Skin is warm.    Vital signs in last 24 hours: Temp:  [98.4 F (36.9 C)] 98.4 F (36.9 C) (12/10 1205) Pulse Rate:  [81] 81 (12/10 1205) Resp:  [18] 18 (12/10 1205) BP: (167)/(97) 167/97 (12/10 1206) SpO2:  [99 %] 99 % (12/10 1205) Weight:  [68 kg] 68 kg (12/10 1205)  Labs:   Estimated body mass index is 26.57 kg/m as calculated from the following:   Height as of this encounter: 5\' 3"  (1.6 m).   Weight as of this encounter: 68 kg.   Imaging Review Plain radiographs demonstrate severe degenerative joint disease of the bilateral hip(s). The bone quality appears to be good for age and reported activity level.    Preoperative templating of the joint replacement has been completed, documented, and submitted to the Operating Room personnel in order to optimize intra-operative equipment management.     Assessment/Plan:  End stage arthritis, left hip(s)  The patient history, physical examination, clinical judgement of the provider and imaging studies  are consistent with end stage degenerative joint disease of the left hip(s) and total hip arthroplasty is deemed medically necessary. The treatment options including medical management, injection therapy, arthroscopy and arthroplasty were discussed at length. The risks and benefits of total hip arthroplasty were presented and reviewed. The risks due to aseptic loosening, infection, stiffness, dislocation/subluxation,  thromboembolic complications and other imponderables were discussed.  The patient acknowledged the explanation, agreed to proceed with the plan and consent was signed. Patient is being admitted for inpatient treatment for surgery, pain control, PT, OT, prophylactic antibiotics, VTE prophylaxis, progressive ambulation and ADL's and discharge planning.The patient is planning to be discharged home with home health services

## 2018-07-21 NOTE — Anesthesia Procedure Notes (Signed)
Procedure Name: Intubation Performed by: Moiz Ryant H, CRNA Pre-anesthesia Checklist: Patient identified, Emergency Drugs available, Suction available and Patient being monitored Patient Re-evaluated:Patient Re-evaluated prior to induction Oxygen Delivery Method: Circle System Utilized Preoxygenation: Pre-oxygenation with 100% oxygen Induction Type: IV induction Ventilation: Mask ventilation without difficulty Laryngoscope Size: Mac and 3 Grade View: Grade I Tube type: Oral Tube size: 7.0 mm Number of attempts: 1 Airway Equipment and Method: Stylet Placement Confirmation: ETT inserted through vocal cords under direct vision,  positive ETCO2 and breath sounds checked- equal and bilateral Secured at: 21 cm Tube secured with: Tape Dental Injury: Teeth and Oropharynx as per pre-operative assessment        

## 2018-07-21 NOTE — Anesthesia Preprocedure Evaluation (Addendum)
Anesthesia Evaluation  Patient identified by MRN, date of birth, ID band Patient awake    Reviewed: Allergy & Precautions, NPO status , Patient's Chart, lab work & pertinent test results  History of Anesthesia Complications Negative for: history of anesthetic complications  Airway Mallampati: II  TM Distance: >3 FB Neck ROM: Full    Dental no notable dental hx.    Pulmonary neg pulmonary ROS, former smoker,    Pulmonary exam normal        Cardiovascular hypertension, Normal cardiovascular exam     Neuro/Psych negative neurological ROS  negative psych ROS   GI/Hepatic Neg liver ROS, GERD  ,  Endo/Other  diabetesHypothyroidism   Renal/GU negative Renal ROS  negative genitourinary   Musculoskeletal  (+) Arthritis , Osteoarthritis,    Abdominal   Peds  Hematology negative hematology ROS (+)   Anesthesia Other Findings 72 yo F for L THA - HTN, DM, hypothyroid, GERD, s/p lumbar fusion  Reproductive/Obstetrics                            Anesthesia Physical Anesthesia Plan  ASA: II  Anesthesia Plan: General   Post-op Pain Management:    Induction: Intravenous  PONV Risk Score and Plan: 3 and Ondansetron, Dexamethasone, Midazolam and Treatment may vary due to age or medical condition  Airway Management Planned: LMA  Additional Equipment: None  Intra-op Plan:   Post-operative Plan: Extubation in OR  Informed Consent: I have reviewed the patients History and Physical, chart, labs and discussed the procedure including the risks, benefits and alternatives for the proposed anesthesia with the patient or authorized representative who has indicated his/her understanding and acceptance.     Plan Discussed with:   Anesthesia Plan Comments:         Anesthesia Quick Evaluation

## 2018-07-21 NOTE — Anesthesia Postprocedure Evaluation (Signed)
Anesthesia Post Note  Patient: Kristina Huang  Procedure(s) Performed: LEFT TOTAL HIP ARTHROPLASTY ANTERIOR APPROACH (Left Hip)     Patient location during evaluation: PACU Anesthesia Type: General Level of consciousness: awake and alert Pain management: pain level controlled Vital Signs Assessment: post-procedure vital signs reviewed and stable Respiratory status: spontaneous breathing, nonlabored ventilation and respiratory function stable Cardiovascular status: blood pressure returned to baseline and stable Postop Assessment: no apparent nausea or vomiting Anesthetic complications: no    Last Vitals:  Vitals:   07/21/18 1600 07/21/18 1615  BP: 131/80 (!) 120/59  Pulse: 95 86  Resp: 16 17  Temp:    SpO2: 96% 99%    Last Pain:  Vitals:   07/21/18 1615  TempSrc:   PainSc: 8                  Lidia Collum

## 2018-07-22 ENCOUNTER — Encounter (HOSPITAL_COMMUNITY): Payer: Self-pay | Admitting: Orthopaedic Surgery

## 2018-07-22 LAB — CBC
HEMATOCRIT: 38.2 % (ref 36.0–46.0)
Hemoglobin: 12.2 g/dL (ref 12.0–15.0)
MCH: 30 pg (ref 26.0–34.0)
MCHC: 31.9 g/dL (ref 30.0–36.0)
MCV: 94.1 fL (ref 80.0–100.0)
Platelets: 334 10*3/uL (ref 150–400)
RBC: 4.06 MIL/uL (ref 3.87–5.11)
RDW: 12.6 % (ref 11.5–15.5)
WBC: 11.7 10*3/uL — ABNORMAL HIGH (ref 4.0–10.5)
nRBC: 0 % (ref 0.0–0.2)

## 2018-07-22 LAB — BASIC METABOLIC PANEL
Anion gap: 10 (ref 5–15)
BUN: 5 mg/dL — ABNORMAL LOW (ref 8–23)
CO2: 30 mmol/L (ref 22–32)
Calcium: 8.3 mg/dL — ABNORMAL LOW (ref 8.9–10.3)
Chloride: 98 mmol/L (ref 98–111)
Creatinine, Ser: 0.79 mg/dL (ref 0.44–1.00)
GFR calc Af Amer: >60 mL/min (ref >60)
Glucose, Bld: 149 mg/dL — ABNORMAL HIGH (ref 70–99)
Potassium: 3.2 mmol/L — ABNORMAL LOW (ref 3.5–5.1)
Sodium: 138 mmol/L (ref 135–145)

## 2018-07-22 LAB — GLUCOSE, CAPILLARY
Glucose-Capillary: 110 mg/dL — ABNORMAL HIGH (ref 70–99)
Glucose-Capillary: 139 mg/dL — ABNORMAL HIGH (ref 70–99)
Glucose-Capillary: 152 mg/dL — ABNORMAL HIGH (ref 70–99)
Glucose-Capillary: 162 mg/dL — ABNORMAL HIGH (ref 70–99)

## 2018-07-22 NOTE — Evaluation (Signed)
Occupational Therapy Evaluation Patient Details Name: Kristina Huang MRN: [id number] DOB: 1946-06-13 Today's Date: 07/22/2018    History of Present Illness Pt is 72 y.o. female s/p elective left THA, direct anterior approach (07/21/18), secondary to osteoarthritis. PMH significant for arthritis, lumbar fusion, GERD, HTN, hyperlipidemia, diabetes mellitus, thyroid cancer.    Clinical Impression   PTA Pt mod I with RW for mobility, shower seat for bathing. Completing ADL and transfers without assist. Pt is currentlty min guard with RW for transfers (cueing for safety), mod A for LB ADL and min guard for standing balance during grooming tasks. Pt will benefit from skilled OT in the acute setting to maximize safety and independence in ADL and transfers. Next session to focus on tub transfer and AE education.     Follow Up Recommendations  Supervision/Assistance - 24 hour;Follow surgeon's recommendation for DC plan and follow-up therapies    Equipment Recommendations  None recommended by OT(Pt has appropriate DME, potentially AE for LB)    Recommendations for Other Services       Precautions / Restrictions Precautions Precautions: Fall Restrictions Weight Bearing Restrictions: Yes LLE Weight Bearing: Weight bearing as tolerated      Mobility Bed Mobility Overal bed mobility: Needs Assistance Bed Mobility: Sit to Supine       Sit to supine: Mod assist   General bed mobility comments: assist for BLE back into bed  Transfers Overall transfer level: Needs assistance Equipment used: Rolling walker (2 wheeled) Transfers: Sit to/from Stand Sit to Stand: Min guard         General transfer comment: vc for safe hand placement    Balance Overall balance assessment: Needs assistance Sitting-balance support: Bilateral upper extremity supported;Feet supported Sitting balance-Leahy Scale: Fair Sitting balance - Comments: right lateral lean in sitting, guarding left hip    Standing balance support: Bilateral upper extremity supported;No upper extremity supported;During functional activity Standing balance-Leahy Scale: Poor Standing balance comment: able to do short periods of static standing for grooming,                            ADL either performed or assessed with clinical judgement   ADL Overall ADL's : Needs assistance/impaired Eating/Feeding: Independent;Sitting   Grooming: Min guard;Standing;Oral care;Wash/dry face;Wash/dry hands Grooming Details (indicate cue type and reason): sink level, cues for RW management Upper Body Bathing: Set up;Sitting   Lower Body Bathing: Moderate assistance;Sitting/lateral leans   Upper Body Dressing : Set up;Sitting   Lower Body Dressing: Sit to/from stand;Maximal assistance Lower Body Dressing Details (indicate cue type and reason): Pt able to complete transfer, unable to complete figure 4, or manage clothing AND transfer Toilet Transfer: Min guard;Ambulation;RW Toilet Transfer Details (indicate cue type and reason): vc for safe hand placement Toileting- Clothing Manipulation and Hygiene: Set up;Sitting/lateral lean;Sit to/from stand Toileting - Clothing Manipulation Details (indicate cue type and reason): able to perform rear peri care in standing     Functional mobility during ADLs: Min guard;Rolling walker General ADL Comments: decreased access to LB for ADL due to pain. Boyfriend willing and able to assist     Vision Patient Visual Report: No change from baseline       Perception     Praxis      Pertinent Vitals/Pain Pain Assessment: Faces Faces Pain Scale: Hurts even more Pain Location: left hip Pain Descriptors / Indicators: Grimacing;Guarding;Pressure;Moaning;Sore Pain Intervention(s): Monitored during session;Repositioned     Hand Dominance Right  Extremity/Trunk Assessment Upper Extremity Assessment Upper Extremity Assessment: Overall WFL for tasks assessed   Lower  Extremity Assessment Lower Extremity Assessment: Defer to PT evaluation LLE Deficits / Details: s/p left THA (07/21/18)   Cervical / Trunk Assessment Cervical / Trunk Assessment: Normal   Communication Communication Communication: No difficulties   Cognition Arousal/Alertness: Awake/alert Behavior During Therapy: WFL for tasks assessed/performed Overall Cognitive Status: Within Functional Limits for tasks assessed                                     General Comments  Dressing in tact, no sign of bleeding through dressings. Some edema present. Pt reported some nausea and mild dizziness with standing, improved with seated rest and cuing for deep breaths.    Exercises Exercises: Total Joint Total Joint Exercises Ankle Circles/Pumps: AROM;Both;10 reps;Seated   Shoulder Instructions      Home Living Family/patient expects to be discharged to:: Private residence Living Arrangements: Spouse/significant other Available Help at Discharge: Family;Available PRN/intermittently(boyfriend living with her, son lives across the street) Type of Home: House Home Access: Stairs to enter Technical brewer of Steps: 1   Home Layout: One level     Bathroom Shower/Tub: Teacher, early years/pre: Standard Bathroom Accessibility: Yes How Accessible: Accessible via walker Home Equipment: [place] [employer] - 2 wheels;Cane - single point;Shower seat;Grab bars - tub/shower          Prior Functioning/Environment Level of Independence: Independent with assistive device(s)        Comments: ambulates with RW last couple of weeks, was ambulating with SPC prior to that        OT Problem List: Decreased range of motion;Decreased activity tolerance;Impaired balance (sitting and/or standing);Pain      OT Treatment/Interventions: Self-care/ADL training;DME and/or AE instruction;Therapeutic activities;Patient/family education;Balance training    OT Goals(Current goals can be  found in the care plan section) Acute Rehab OT Goals Patient Stated Goal: return to regular activity and hobbies without hip pain OT Goal Formulation: With patient Time For Goal Achievement: 08/05/18 Potential to Achieve Goals: Good ADL Goals Pt Will Perform Grooming: with modified independence;standing Pt Will Perform Lower Body Bathing: with modified independence;with adaptive equipment;sit to/from stand Pt Will Perform Lower Body Dressing: with min guard assist;with caregiver independent in assisting;with adaptive equipment;sit to/from stand Pt Will Transfer to Toilet: with supervision;ambulating Pt Will Perform Toileting - Clothing Manipulation and hygiene: sit to/from stand;with modified independence Pt Will Perform Tub/Shower Transfer: Tub transfer;with supervision;with caregiver independent in assisting;shower seat;rolling walker  OT Frequency: Min 2X/week   Barriers to D/C:            Co-evaluation              AM-PAC OT "6 Clicks" Daily Activity     Outcome Measure Help from another person eating meals?: None Help from another person taking care of personal grooming?: A Little Help from another person toileting, which includes using toliet, bedpan, or urinal?: A Little Help from another person bathing (including washing, rinsing, drying)?: A Lot Help from another person to put on and taking off regular upper body clothing?: None Help from another person to put on and taking off regular lower body clothing?: A Lot 6 Click Score: 18   End of Session Equipment Utilized During Treatment: Gait belt;Rolling walker Nurse Communication: Mobility status;Weight bearing status;Other (comment)(urinated in the bathroom)  Activity Tolerance: Patient tolerated treatment well Patient left: in  bed;with call bell/phone within reach;with family/visitor present;with SCD's reapplied  OT Visit Diagnosis: Unsteadiness on feet (R26.81);Other abnormalities of gait and mobility  (R26.89);Pain Pain - Right/Left: Left Pain - part of body: Hip                Time: 9733-1250 OT Time Calculation (min): 28 min Charges:  OT General Charges $OT Visit: 1 Visit OT Evaluation $OT Eval Moderate Complexity: 1 Mod OT Treatments $Self Care/Home Management : 8-22 mins  Hulda Humphrey OTR/L Acute Rehabilitation Services Pager: [phone] Office: [phone]  Merri Ray Natalya Domzalski 07/22/2018, 11:04 AM

## 2018-07-22 NOTE — Plan of Care (Signed)
  Problem: Pain Managment: Goal: General experience of comfort will improve Outcome: Progressing   Problem: Safety: Goal: Ability to remain free from injury will improve Outcome: Progressing   

## 2018-07-22 NOTE — Evaluation (Signed)
Physical Therapy Evaluation Patient Details Name: Kristina Huang MRN: [id number] DOB: 12-Sep-1945 Today's Date: 07/22/2018   History of Present Illness  Pt is 72 y.o. female s/p elective left THA, direct anterior approach (07/21/18), secondary to osteoarthritis. PMH significant for arthritis, lumbar fusion, GERD, HTN, hyperlipidemia, diabetes mellitus, thyroid cancer.   Clinical Impression  PTA pt was modified independent with daily activities, ambulating with RW past couple of weeks, limited by pain. Pt lives with boyfriend and son lives close for assistance as needed. Pt agreeable to therapy but reports moderate to severe pain. Ambulated to bathroom and in room short distances with hands on min guard for safety, vc for management of RW and regular breathing to improve symptoms of mild dizziness and nausea. Pt had increased fatigue at end of therapy session with 2/4 dyspnea. Educated on importance of continued mobility and initiated seated HEP. Skilled therapy necessary to address deficits in strength, ROM, balance, and endurance to improve functional mobility. PT recommending HHPT at DC to improve deficits and return to independence with daily activities. PT will continue to follow acutely.     Follow Up Recommendations Follow surgeon's recommendation for DC plan and follow-up therapies;Supervision for mobility/OOB    Equipment Recommendations  None recommended by PT       Precautions / Restrictions Precautions Precautions: Fall Restrictions Weight Bearing Restrictions: Yes LLE Weight Bearing: Weight bearing as tolerated      Mobility  Bed Mobility               General bed mobility comments: up in chair at start of treatment  Transfers Overall transfer level: Needs assistance Equipment used: Rolling walker (2 wheeled) Transfers: Sit to/from Stand Sit to Stand: Min guard;Min assist         General transfer comment: Pt minA for stability during first sit to stand from  chair, hands on min guard for second sit to stand from toilet. vc for foot placement for improved power up, and to come to full upright  Ambulation/Gait Ambulation/Gait assistance: Min guard Gait Distance (Feet): 20 Feet Assistive device: Rolling walker (2 wheeled) Gait Pattern/deviations: Step-to pattern;Decreased stance time - left;Decreased weight shift to left;Antalgic   Gait velocity interpretation: <1.31 ft/sec, indicative of household ambulator General Gait Details: Hands on min guard for safety, vc for sequencing, management of RW, and regular breathing. Pt lacking heel strike on LLE, heavily guarding left side with decreased weight bear through LLE.      Balance Overall balance assessment: Needs assistance Sitting-balance support: Bilateral upper extremity supported;Feet supported Sitting balance-Leahy Scale: Fair Sitting balance - Comments: right lateral lean in sitting, guarding left hip   Standing balance support: Bilateral upper extremity supported Standing balance-Leahy Scale: Poor Standing balance comment: heavy reliance on UE for support in stance                             Pertinent Vitals/Pain Pain Assessment: Faces Faces Pain Scale: Hurts whole lot Pain Location: left hip Pain Descriptors / Indicators: Grimacing;Guarding;Pressure;Moaning;Sore Pain Intervention(s): Limited activity within patient's tolerance;Monitored during session    [place] expects to be discharged to:: Private residence Living Arrangements: Spouse/significant other Available Help at Discharge: Family;Available PRN/intermittently(boyfriend living with her, son lives across the street) Type of Home: House Home Access: Stairs to enter   [employer] of Steps: 1 Home Layout: One level Home Equipment: Environmental consultant - 2 wheels;Cane - single point;Shower seat;Grab bars - tub/shower  Prior Function Level of Independence: Independent with assistive  device(s)         Comments: ambulates with RW last couple of weeks, was ambulating with SPC prior to that        Extremity/Trunk Assessment   Upper Extremity Assessment Upper Extremity Assessment: Defer to OT evaluation    Lower Extremity Assessment Lower Extremity Assessment: LLE deficits/detail LLE Deficits / Details: s/p left THA (07/21/18)    Cervical / Trunk Assessment Cervical / Trunk Assessment: Normal  Communication   Communication: No difficulties  Cognition Arousal/Alertness: Awake/alert Behavior During Therapy: WFL for tasks assessed/performed Overall Cognitive Status: Within Functional Limits for tasks assessed                                        General Comments General comments (skin integrity, edema, etc.): Dressing in tact, no sign of bleeding through dressings. Some edema present. Pt reported some nausea and mild dizziness with standing, improved with seated rest and cuing for deep breaths.    Exercises Total Joint Exercises Ankle Circles/Pumps: AROM;Both;10 reps;Seated  Pt educated on LAQ's, given HEP handout, and educated on importance of continued mobility throughout the day to prevent stiffness and help decrease edema.    Assessment/Plan    PT Assessment Patient needs continued PT services  PT Problem List Decreased strength;Decreased range of motion;Decreased activity tolerance;Decreased balance;Decreased mobility;Decreased coordination;Decreased knowledge of use of DME;Pain       PT Treatment Interventions DME instruction;Gait training;Stair training;Functional mobility training;Therapeutic activities;Therapeutic exercise;Balance training;Neuromuscular re-education;Patient/family education    PT Goals (Current goals can be found in the Care Plan section)  Acute Rehab PT Goals Patient Stated Goal: return to regular activity and hobbies without hip pain PT Goal Formulation: With patient Time For Goal Achievement:  08/05/18 Potential to Achieve Goals: Fair    Frequency 7X/week    AM-PAC PT "6 Clicks" Mobility  Outcome Measure Help needed turning from your back to your side while in a flat bed without using bedrails?: A Lot Help needed moving from lying on your back to sitting on the side of a flat bed without using bedrails?: A Little Help needed moving to and from a bed to a chair (including a wheelchair)?: A Little Help needed standing up from a chair using your arms (e.g., wheelchair or bedside chair)?: A Little Help needed to walk in hospital room?: A Little Help needed climbing 3-5 steps with a railing? : A Lot 6 Click Score: 16    End of Session Equipment Utilized During Treatment: Gait belt Activity Tolerance: Patient tolerated treatment well;Patient limited by pain Patient left: in chair;with call bell/phone within reach Nurse Communication: Mobility status PT Visit Diagnosis: Other abnormalities of gait and mobility (R26.89);Muscle weakness (generalized) (M62.81);Difficulty in walking, not elsewhere classified (R26.2);Pain Pain - Right/Left: Left Pain - part of body: Hip    Time: 1941-7408 PT Time Calculation (min) (ACUTE ONLY): 35 min   Charges:   PT Evaluation $PT Eval Low Complexity: 1 Low PT Treatments $Gait Training: 8-22 mins        Vernell Morgans, SPT Acute Rehabilitation Services Office [phone]   Vernell Morgans 07/22/2018, 10:16 AM

## 2018-07-22 NOTE — Progress Notes (Signed)
Pt sitting up in recliner and witnessed vomiting of 460ml of thin green liquid. Given Iv zofran and encouraged flow frequents sips of gingerale and nibbles of saltine crackers. States feelings of relief. Will f/u.

## 2018-07-22 NOTE — Progress Notes (Signed)
Subjective: 1 Day Post-Op Procedure(s) (LRB): LEFT TOTAL HIP ARTHROPLASTY ANTERIOR APPROACH (Left) Patient reports pain as moderate.    Objective: Vital signs in last 24 hours: Temp:  [96.9 F (36.1 C)-98.7 F (37.1 C)] 98.7 F (37.1 C) (12/11 0350) Pulse Rate:  [77-103] 101 (12/11 0350) Resp:  [12-22] 12 (12/11 0350) BP: (119-167)/(59-97) 138/94 (12/11 0350) SpO2:  [95 %-100 %] 100 % (12/11 0350) Weight:  [68 kg] 68 kg (12/10 1205)  Intake/Output from previous day: 12/10 0701 - 12/11 0700 In: 1680 [P.O.:100; I.V.:1580] Out: 850 [Urine:500; Blood:350] Intake/Output this shift: No intake/output data recorded.  Recent Labs    07/22/18 0236  HGB 12.2   Recent Labs    07/22/18 0236  WBC 11.7*  RBC 4.06  HCT 38.2  PLT 334   Recent Labs    07/22/18 0236  NA 138  K 3.2*  CL 98  CO2 30  BUN 5*  CREATININE 0.79  GLUCOSE 149*  CALCIUM 8.3*   No results for input(s): LABPT, INR in the last 72 hours.  Sensation intact distally Intact pulses distally Dorsiflexion/Plantar flexion intact Incision: dressing C/D/I  Assessment/Plan: 1 Day Post-Op Procedure(s) (LRB): LEFT TOTAL HIP ARTHROPLASTY ANTERIOR APPROACH (Left) Up with therapy Discharge home with home health next 1-2 days.    Mcarthur Rossetti 07/22/2018, 7:47 AM

## 2018-07-22 NOTE — Plan of Care (Signed)
  Problem: Education: Goal: Knowledge of General Education information will improve Description Including pain rating scale, medication(s)/side effects and non-pharmacologic comfort measures Outcome: Progressing   

## 2018-07-22 NOTE — Progress Notes (Signed)
Physical Therapy Treatment Patient Details Name: Kristina Huang MRN: [id number] DOB: Sep 06, 1945 Today's Date: 07/22/2018    History of Present Illness Pt is 72 y.o. female s/p elective left THA, direct anterior approach (07/21/18), secondary to osteoarthritis. PMH significant for arthritis, lumbar fusion, GERD, HTN, hyperlipidemia, diabetes mellitus, thyroid cancer.     PT Comments    Pt reports improvements in nausea and dizziness symptoms since this morning, no reports of symptoms for duration of treatment today. Pt completed mobility with hands on min guard, MinA needed for management of RW during sit to stand. Educated on supine and seated HEP and importance of continued mobility throughout the day, pt able to demonstrate with proper form. DC plan remains appropriate pending safety with stair navigation tomorrow, PT will continue to follow acutely.   Follow Up Recommendations  Follow surgeon's recommendation for DC plan and follow-up therapies;Supervision for mobility/OOB     Equipment Recommendations  None recommended by PT       Precautions / Restrictions Precautions Precautions: Fall Restrictions Weight Bearing Restrictions: Yes LLE Weight Bearing: Weight bearing as tolerated    Mobility  Bed Mobility Overal bed mobility: Needs Assistance Bed Mobility: Supine to Sit     Supine to sit: Min guard;HOB elevated     General bed mobility comments: hands on min guard for safety, pt completed supine to sit with use of bed rail and increased time and effort due to pain.   Transfers Overall transfer level: Needs assistance Equipment used: Rolling walker (2 wheeled) Transfers: Sit to/from Stand Sit to Stand: Min assist         General transfer comment: MinA provided to keep RW on the ground during pt's power up into stance.   Ambulation/Gait Ambulation/Gait assistance: Min guard Gait Distance (Feet): 2 Feet Assistive device: Rolling walker (2 wheeled) Gait  Pattern/deviations: Step-to pattern;Decreased stance time - left;Decreased weight shift to left;Antalgic   Gait velocity interpretation: <1.8 ft/sec, indicate of risk for recurrent falls General Gait Details: hands on min guard for safety, pt ambulated from bed to chair with min cuing needed for regular breathing and management of RW.        Balance Overall balance assessment: Needs assistance Sitting-balance support: Bilateral upper extremity supported;Feet supported Sitting balance-Leahy Scale: Fair Sitting balance - Comments: right lateral lean in sitting, guarding left hip   Standing balance support: Bilateral upper extremity supported Standing balance-Leahy Scale: Poor Standing balance comment: heavy reliance on UE for support in stance                            Cognition Arousal/Alertness: Awake/alert Behavior During Therapy: WFL for tasks assessed/performed Overall Cognitive Status: Within Functional Limits for tasks assessed                                        Exercises Total Joint Exercises Ankle Circles/Pumps: AROM;Both;5 reps;Seated Heel Slides: AROM;Left;5 reps;Seated Hip ABduction/ADduction: AAROM;Left;5 reps;Seated Long Arc Quad: AROM;Left;5 reps;Seated  All exercises completed in decreased ROM limited by pain and edema.     General Comments General comments (skin integrity, edema, etc.): Pt's boyfriend present for duration of treatment.      Pertinent Vitals/Pain Pain Assessment: Faces Faces Pain Scale: Hurts even more Pain Location: left hip Pain Descriptors / Indicators: Grimacing;Guarding;Pressure;Moaning;Sore Pain Intervention(s): Limited activity within patient's tolerance;Monitored during session;Repositioned  PT Goals (current goals can now be found in the care plan section) Acute Rehab PT Goals Patient Stated Goal: return to regular activity and hobbies without hip pain PT Goal Formulation: With  patient Time For Goal Achievement: 08/05/18 Potential to Achieve Goals: Fair Progress towards PT goals: Progressing toward goals    Frequency    7X/week      PT Plan Current plan remains appropriate       AM-PAC PT "6 Clicks" Mobility   Outcome Measure  Help needed turning from your back to your side while in a flat bed without using bedrails?: A Lot Help needed moving from lying on your back to sitting on the side of a flat bed without using bedrails?: A Little Help needed moving to and from a bed to a chair (including a wheelchair)?: A Little Help needed standing up from a chair using your arms (e.g., wheelchair or bedside chair)?: A Little Help needed to walk in hospital room?: A Little Help needed climbing 3-5 steps with a railing? : A Lot 6 Click Score: 16    End of Session Equipment Utilized During Treatment: Gait belt Activity Tolerance: Patient tolerated treatment well Patient left: in chair;with call bell/phone within reach;with family/visitor present Nurse Communication: Mobility status PT Visit Diagnosis: Other abnormalities of gait and mobility (R26.89);Muscle weakness (generalized) (M62.81);Difficulty in walking, not elsewhere classified (R26.2);Pain Pain - Right/Left: Left Pain - part of body: Hip     Time: 1340-1355 PT Time Calculation (min) (ACUTE ONLY): 15 min  Charges:  $Therapeutic Exercise: 8-22 mins                     Vernell Morgans, SPT Acute Rehabilitation Services Office [phone]    Vernell Morgans 07/22/2018, 3:50 PM

## 2018-07-23 LAB — GLUCOSE, CAPILLARY
GLUCOSE-CAPILLARY: 131 mg/dL — AB (ref 70–99)
Glucose-Capillary: 147 mg/dL — ABNORMAL HIGH (ref 70–99)
Glucose-Capillary: 99 mg/dL (ref 70–99)

## 2018-07-23 MED ORDER — OXYCODONE HCL 5 MG PO TABS: 5.0000 mg | ORAL_TABLET | ORAL | 0 refills | Status: DC | PRN

## 2018-07-23 MED ORDER — OXYCODONE-ACETAMINOPHEN 5-325 MG PO TABS: 1.0000 | ORAL_TABLET | ORAL | 0 refills | Status: DC | PRN

## 2018-07-23 MED ORDER — METHOCARBAMOL 500 MG PO TABS: 500.0000 mg | ORAL_TABLET | Freq: Four times a day (QID) | ORAL | 0 refills | Status: DC | PRN

## 2018-07-23 MED ORDER — ASPIRIN 81 MG PO CHEW: 81.0000 mg | CHEWABLE_TABLET | Freq: Two times a day (BID) | ORAL | 0 refills | Status: DC

## 2018-07-23 NOTE — Plan of Care (Signed)
  Problem: Pain Managment: Goal: General experience of comfort will improve Outcome: Progressing   Problem: Safety: Goal: Ability to remain free from injury will improve Outcome: Progressing   

## 2018-07-23 NOTE — Progress Notes (Signed)
Physical Therapy Treatment Patient Details Name: Kristina Huang MRN: [id number] DOB: [birth year] Today's Date: 07/23/2018    History of Present Illness Pt is 72 y.o. female s/p elective left THA, direct anterior approach (07/21/18), secondary to osteoarthritis. PMH significant for arthritis, lumbar fusion, GERD, HTN, hyperlipidemia, diabetes mellitus, thyroid cancer.     PT Comments    Pt feeling better this afternoon, reporting no nausea since this morning. Pt completed bed mobility with supervision for safety, and all other mobility with hands on min guard for safety. Ambulated to restroom and to recliner. Reviewed seated and supine HEP with patient, education provided on importance of regular mobility and management of pain and nausea. DC plan remains appropriate, PT will continue to follow acutely.    Follow Up Recommendations  Follow surgeon's recommendation for DC plan and follow-up therapies;Supervision for mobility/OOB     Equipment Recommendations  None recommended by PT       Precautions / Restrictions Precautions Precautions: Fall Restrictions Weight Bearing Restrictions: Yes LLE Weight Bearing: Weight bearing as tolerated    Mobility  Bed Mobility Overal bed mobility: Needs Assistance Bed Mobility: Supine to Sit     Supine to sit: Supervision;HOB elevated     General bed mobility comments: Pt able to come to sitting on EOB with use of bedrail, supervision for safety, no cuing needed.   Transfers Overall transfer level: Needs assistance Equipment used: Rolling walker (2 wheeled) Transfers: Sit to/from Stand Sit to Stand: Min guard         General transfer comment: hands on min guard for safety, pt able to stand and manage RW without assistance.   Ambulation/Gait Ambulation/Gait assistance: Min guard Gait Distance (Feet): 10 Feet Assistive device: Rolling walker (2 wheeled) Gait Pattern/deviations: Step-to pattern;Decreased stance time - left;Decreased  weight shift to left;Antalgic;Step-through pattern   Gait velocity interpretation: 1.31 - 2.62 ft/sec, indicative of limited community ambulator General Gait Details: hands on min guard for safety, pt completing ambulation with improvements in step-through pattern. Ambulated to bathroom and to recliner, vc to keep RW closer to her.        Balance Overall balance assessment: Needs assistance Sitting-balance support: Bilateral upper extremity supported;Feet supported Sitting balance-Leahy Scale: Fair     Standing balance support: Bilateral upper extremity supported Standing balance-Leahy Scale: Fair Standing balance comment: less reliant on UE for support, improving weight shift to LLE.                            Cognition Arousal/Alertness: Awake/alert Behavior During Therapy: WFL for tasks assessed/performed Overall Cognitive Status: Within Functional Limits for tasks assessed                                        Exercises Total Joint Exercises Ankle Circles/Pumps: AROM;Both;10 reps Quad Sets: AROM;Left;10 reps;Seated(long sitting) Heel Slides: AROM;Left;10 reps;Seated Hip ABduction/ADduction: AAROM;Left;10 reps;Seated(long sitting) Long Arc Quad: AROM;Left;10 reps;Seated    General Comments General comments (skin integrity, edema, etc.): Pt reports no nausea throughout duration of treatment this afternoon. Educated on importance of continued mobility throughout the day in tolerable amounts to manage nausea and pain symptoms at home.       Pertinent Vitals/Pain Pain Assessment: Faces Faces Pain Scale: Hurts a little bit Pain Location: left hip Pain Descriptors / Indicators: Grimacing;Guarding;Sore Pain Intervention(s): Limited activity within patient's tolerance;Monitored during session;Repositioned  PT Goals (current goals can now be found in the care plan section) Acute Rehab PT Goals Patient Stated Goal: return to regular  activity and hobbies without hip pain PT Goal Formulation: With patient Time For Goal Achievement: 08/05/18 Potential to Achieve Goals: Fair Progress towards PT goals: Progressing toward goals    Frequency    7X/week      PT Plan Current plan remains appropriate       AM-PAC PT "6 Clicks" Mobility   Outcome Measure  Help needed turning from your back to your side while in a flat bed without using bedrails?: A Lot Help needed moving from lying on your back to sitting on the side of a flat bed without using bedrails?: A Little Help needed moving to and from a bed to a chair (including a wheelchair)?: A Little Help needed standing up from a chair using your arms (e.g., wheelchair or bedside chair)?: A Little Help needed to walk in hospital room?: A Little Help needed climbing 3-5 steps with a railing? : A Lot 6 Click Score: 16    End of Session Equipment Utilized During Treatment: Gait belt Activity Tolerance: Patient tolerated treatment well(nausea) Patient left: with call bell/phone within reach;in chair Nurse Communication: Mobility status PT Visit Diagnosis: Other abnormalities of gait and mobility (R26.89);Muscle weakness (generalized) (M62.81);Difficulty in walking, not elsewhere classified (R26.2);Pain Pain - Right/Left: Left Pain - part of body: Hip     Time: 1441-1456 PT Time Calculation (min) (ACUTE ONLY): 15 min  Charges:  $Therapeutic Exercise: 8-22 mins                     Vernell Morgans, SPT Acute Rehabilitation Services Office [phone]    Vernell Morgans 07/23/2018, 5:37 PM

## 2018-07-23 NOTE — Progress Notes (Signed)
Occupational Therapy Treatment Patient Details Name: Kristina Huang MRN: [id number] DOB: April 12, 1946 Today's Date: 07/23/2018    History of present illness Pt is 72 y.o. female s/p elective left THA, direct anterior approach (07/21/18), secondary to osteoarthritis. PMH significant for arthritis, lumbar fusion, GERD, HTN, hyperlipidemia, diabetes mellitus, thyroid cancer.    OT comments  Pt progressing towards OT goals this session. Pt was able to perform transfers at min guard with RW (vc for safe hand placement) as well as peri care with lateral leans at set up level. Pt then able to perform standing balance and increased activity tolerance for grooming tasks at sink. Current POC remains appropriate and OT will continue to follow acutely.    Follow Up Recommendations  Supervision/Assistance - 24 hour;Follow surgeon's recommendation for DC plan and follow-up therapies    Equipment Recommendations  None recommended by OT    Recommendations for Other Services      Precautions / Restrictions Precautions Precautions: Fall Restrictions Weight Bearing Restrictions: Yes LLE Weight Bearing: Weight bearing as tolerated       Mobility Bed Mobility Overal bed mobility: Needs Assistance Bed Mobility: Supine to Sit     Supine to sit: Supervision;HOB elevated     General bed mobility comments: Pt OOB at beginning and end of session  Transfers Overall transfer level: Needs assistance Equipment used: Rolling walker (2 wheeled) Transfers: Sit to/from Stand Sit to Stand: Min guard         General transfer comment: hands on min guard for safety, pt able to stand and manage RW without assistance.     Balance Overall balance assessment: Needs assistance Sitting-balance support: Bilateral upper extremity supported;Feet supported Sitting balance-Leahy Scale: Fair     Standing balance support: No upper extremity supported;During functional activity Standing balance-Leahy Scale:  Fair Standing balance comment: leans against sink during standing grooming tasks                           ADL either performed or assessed with clinical judgement   ADL Overall ADL's : Needs assistance/impaired     Grooming: Oral care;Wash/dry face;Wash/dry hands;Min guard;Standing Grooming Details (indicate cue type and reason): sink level                 Toilet Transfer: Min guard;Ambulation;RW Toilet Transfer Details (indicate cue type and reason): vc for safe hand placement Toileting- Clothing Manipulation and Hygiene: Set up;Sitting/lateral lean;Sit to/from stand Toileting - Clothing Manipulation Details (indicate cue type and reason): able to perform rear peri care in standing     Functional mobility during ADLs: Min guard;Rolling walker       Vision       Perception     Praxis      Cognition Arousal/Alertness: Awake/alert Behavior During Therapy: WFL for tasks assessed/performed Overall Cognitive Status: Within Functional Limits for tasks assessed                                          Exercises Exercises: Total Joint Total Joint Exercises Ankle Circles/Pumps: AROM;Both;10 reps Quad Sets: AROM;Left;10 reps;Seated(long sitting) Heel Slides: AROM;Left;10 reps;Seated Hip ABduction/ADduction: AAROM;Left;10 reps;Seated(long sitting) Long Arc Quad: AROM;Left;10 reps;Seated   Shoulder Instructions       General Comments Pt reports no nausea throughout duration of treatment this afternoon. Educated on importance of continued mobility throughout the day in tolerable  amounts to manage nausea and pain symptoms at home.     Pertinent Vitals/ Pain       Pain Assessment: Faces Faces Pain Scale: Hurts little more Pain Location: left hip Pain Descriptors / Indicators: Grimacing;Guarding;Sore Pain Intervention(s): Limited activity within patient's tolerance;Monitored during session;Repositioned  Home Living                                           Prior Functioning/Environment              Frequency  Min 2X/week        Progress Toward Goals  OT Goals(current goals can now be found in the care plan section)  Progress towards OT goals: Progressing toward goals  Acute Rehab OT Goals Patient Stated Goal: return to regular activity and hobbies without hip pain OT Goal Formulation: With patient Time For Goal Achievement: 08/05/18 Potential to Achieve Goals: Good  Plan Discharge plan remains appropriate;Frequency remains appropriate    Co-evaluation                 AM-PAC OT "6 Clicks" Daily Activity     Outcome Measure   Help from another person eating meals?: None Help from another person taking care of personal grooming?: A Little Help from another person toileting, which includes using toliet, bedpan, or urinal?: A Little Help from another person bathing (including washing, rinsing, drying)?: A Lot Help from another person to put on and taking off regular upper body clothing?: None Help from another person to put on and taking off regular lower body clothing?: A Lot 6 Click Score: 18    End of Session Equipment Utilized During Treatment: Gait belt;Rolling walker  OT Visit Diagnosis: Unsteadiness on feet (R26.81);Other abnormalities of gait and mobility (R26.89);Pain Pain - Right/Left: Left Pain - part of body: Hip   Activity Tolerance Patient tolerated treatment well   Patient Left in chair;with call bell/phone within reach;with family/visitor present   Nurse Communication Mobility status        Time: 0931-1216 OT Time Calculation (min): 20 min  Charges: OT General Charges $OT Visit: 1 Visit OT Treatments $Self Care/Home Management : 8-22 mins  Hulda Humphrey OTR/L Acute Rehabilitation Services Pager: [phone] Office: [place] 07/23/2018, 5:47 PM

## 2018-07-23 NOTE — Progress Notes (Signed)
Subjective: 2 Days Post-Op Procedure(s) (LRB): LEFT TOTAL HIP ARTHROPLASTY ANTERIOR APPROACH (Left) Patient reports pain as moderate.  Feels much better today.  Objective: Vital signs in last 24 hours: Temp:  [98.2 F (36.8 C)-100.8 F (38.2 C)] 98.2 F (36.8 C) (12/12 0920) Pulse Rate:  [103-112] 103 (12/12 0920) Resp:  [18-20] 18 (12/12 0920) BP: (94-140)/(59-120) 124/68 (12/12 0920) SpO2:  [97 %-100 %] 97 % (12/12 0920)  Intake/Output from previous day: 12/11 0701 - 12/12 0700 In: 240 [P.O.:240] Out: 400 [Emesis/NG output:400] Intake/Output this shift: Total I/O In: 360 [P.O.:360] Out: -   Recent Labs    07/22/18 0236  HGB 12.2   Recent Labs    07/22/18 0236  WBC 11.7*  RBC 4.06  HCT 38.2  PLT 334   Recent Labs    07/22/18 0236  NA 138  K 3.2*  CL 98  CO2 30  BUN 5*  CREATININE 0.79  GLUCOSE 149*  CALCIUM 8.3*   No results for input(s): LABPT, INR in the last 72 hours.  Sensation intact distally Intact pulses distally Dorsiflexion/Plantar flexion intact Incision: dressing C/D/I  Assessment/Plan: 2 Days Post-Op Procedure(s) (LRB): LEFT TOTAL HIP ARTHROPLASTY ANTERIOR APPROACH (Left) Up with therapy Plan for discharge tomorrow Discharge home with home health    Mcarthur Rossetti 07/23/2018, 12:51 PM

## 2018-07-23 NOTE — Discharge Instructions (Signed)

## 2018-07-23 NOTE — Progress Notes (Signed)
Physical Therapy Treatment Patient Details Name: Kristina Huang MRN: [id number] DOB: 12-04-1945 Today's Date: 07/23/2018    History of Present Illness Pt is 72 y.o. female s/p elective left THA, direct anterior approach (07/21/18), secondary to osteoarthritis. PMH significant for arthritis, lumbar fusion, GERD, HTN, hyperlipidemia, diabetes mellitus, thyroid cancer.     PT Comments    Pt agreeable to therapy, up on commode at start of treatment. She reports improvements in pain and nausea today, feeling well-rested. Attempted to progress ambulation, limited by onset of nausea as fatigue increased. Pt hands on min guard for mobility, required two standing rest breaks during gait training before sitting in recliner and returning to room. DC plan remains appropriate as pt can tolerate short distances needed to get around her home safely. PT will focus on education for continued mobility and management of pain and nausea at next treatment session this afternoon.  Follow Up Recommendations  Follow surgeon's recommendation for DC plan and follow-up therapies;Supervision for mobility/OOB     Equipment Recommendations  None recommended by PT       Precautions / Restrictions Precautions Precautions: Fall Restrictions Weight Bearing Restrictions: Yes LLE Weight Bearing: Weight bearing as tolerated    Mobility  Bed Mobility               General bed mobility comments: pt sitting on commode at start of treatment.  Transfers Overall transfer level: Needs assistance Equipment used: Rolling walker (2 wheeled) Transfers: Sit to/from Stand Sit to Stand: Min guard         General transfer comment: (P) hands on min guard for safety, pt able to stand and manage RW without assistance, vc for safe hand placement.  Ambulation/Gait Ambulation/Gait assistance: Min guard Gait Distance (Feet): 50 Feet Assistive device: Rolling walker (2 wheeled) Gait Pattern/deviations: Step-to  pattern;Decreased stance time - left;Decreased weight shift to left;Antalgic;Step-through pattern     General Gait Details: (P) hands on min guard for safety, ambulating with decreased heel strike on LLE, vc for relaxing shoulders, regular breathing, and to work towards step-through gait pattern. Pt ambulating short distance in hall limited by onset of pain and nausea with 2/4 dyspnea. Pt took 2 standing rest breaks during ambulation.        Balance Overall balance assessment: Needs assistance Sitting-balance support: Bilateral upper extremity supported;Feet supported Sitting balance-Leahy Scale: Fair     Standing balance support: Bilateral upper extremity supported Standing balance-Leahy Scale: Fair Standing balance comment: less reliant on UE for support, improving weight shift to LLE.                            Cognition Arousal/Alertness: Awake/alert Behavior During Therapy: WFL for tasks assessed/performed Overall Cognitive Status: Within Functional Limits for tasks assessed                                           General Comments General comments (skin integrity, edema, etc.): Pt reports no feelings of nausea or dizziness at start of treatment or during beginning of ambulation, nausea and light headedness increased quickly as fatigue set in, limiting patient's treatment.       Pertinent Vitals/Pain Pain Assessment: Faces Faces Pain Scale: Hurts little more Pain Location: left hip Pain Descriptors / Indicators: Grimacing;Guarding;Moaning;Sore Pain Intervention(s): Limited activity within patient's tolerance;Monitored during session;Repositioned;Premedicated before session  PT Goals (current goals can now be found in the care plan section) Acute Rehab PT Goals Patient Stated Goal: return to regular activity and hobbies without hip pain PT Goal Formulation: With patient Time For Goal Achievement: 08/05/18 Potential to Achieve  Goals: Fair    Frequency    7X/week      PT Plan Current plan remains appropriate       AM-PAC PT "6 Clicks" Mobility   Outcome Measure  Help needed turning from your back to your side while in a flat bed without using bedrails?: A Lot Help needed moving from lying on your back to sitting on the side of a flat bed without using bedrails?: A Little Help needed moving to and from a bed to a chair (including a wheelchair)?: A Little Help needed standing up from a chair using your arms (e.g., wheelchair or bedside chair)?: A Little Help needed to walk in hospital room?: A Little Help needed climbing 3-5 steps with a railing? : A Lot 6 Click Score: 16    End of Session Equipment Utilized During Treatment: Gait belt Activity Tolerance: Patient limited by fatigue;Treatment limited secondary to medical complications (Comment)(nausea) Patient left: with call bell/phone within reach;in bed Nurse Communication: Mobility status;Other (comment)(onset of nausea) PT Visit Diagnosis: Other abnormalities of gait and mobility (R26.89);Muscle weakness (generalized) (M62.81);Difficulty in walking, not elsewhere classified (R26.2);Pain Pain - Right/Left: Left Pain - part of body: Hip     Time: 9191-6606 PT Time Calculation (min) (ACUTE ONLY): 11 min  Charges:  $Gait Training: 8-22 mins                     Vernell Morgans, SPT Acute Rehabilitation Services Office [phone]    Vernell Morgans 07/23/2018, 12:19 PM

## 2018-07-24 LAB — GLUCOSE, CAPILLARY
Glucose-Capillary: 149 mg/dL — ABNORMAL HIGH (ref 70–99)
Glucose-Capillary: 130 mg/dL — ABNORMAL HIGH (ref 70–99)
Glucose-Capillary: 168 mg/dL — ABNORMAL HIGH (ref 70–99)

## 2018-07-24 NOTE — Discharge Summary (Signed)
Patient ID: Kristina Huang MRN: [id number] DOB/AGE: 1945-10-04 72 y.o.  Admit date: 07/21/2018 Discharge date: 07/24/2018  Admission Diagnoses:  Principal Problem:   Unilateral primary osteoarthritis, left hip Active Problems:   Status post total replacement of left hip   Discharge Diagnoses:  Same  Past Medical History:  Diagnosis Date  . Acid reflux   . Arthritis   . Cancer ([place])    Thyroid  . Diabetes mellitus without complication ([facility]CC)    fasting 100-130  . Hypercholesterolemia   . Hypertension   . 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 female who was admitted 07/21/2018 for operative treatment ofUnilateral primary osteoarthritis, left hip. Patient has severe unremitting pain that affects sleep, daily activities, and work/hobbies. After pre-op clearance the patient was taken to the operating room on 07/21/2018 and underwent  Procedure(s): LEFT TOTAL HIP ARTHROPLASTY ANTERIOR APPROACH.    Patient was given perioperative antibiotics:  Anti-infectives (From admission, onward)   Start     Dose/Rate Route Frequency Ordered Stop   07/21/18 2000  ceFAZolin (ANCEF) IVPB 1 g/50 mL premix     1 g 100 mL/hr over 30 Minutes Intravenous Every 6 hours 07/21/18 1715 07/22/18 0217   07/21/18 1200  ceFAZolin (ANCEF) IVPB 2g/100 mL premix     2 g 200 mL/hr over 30 Minutes Intravenous On call to O.R. 07/21/18 1159 07/21/18 1410       Patient was given sequential compression devices, early ambulation, and chemoprophylaxis to prevent DVT.  Patient benefited maximally from hospital stay and there were no complications.    Recent vital signs:  Patient Vitals for the past 24 hrs:  BP Temp Temp src Pulse Resp SpO2  07/24/18 0409 (!) 103/53 98.3 F (36.8 C) Oral (!) 102 16 99  %  07/23/18 1934 (!) 91/51 99.4 F (37.4 C) Oral (!) 102 16 99 %  07/23/18 1407 96/83 98.3 F (36.8 C) Oral 95 18 100 %  07/23/18 0920 124/68 98.2 F (36.8 C) Oral (!) 103 18 97 %     Recent laboratory studies:  Recent Labs    07/22/18 0236  WBC 11.7*  HGB 12.2  HCT 38.2  PLT 334  NA 138  K 3.2*  CL 98  CO2 30  BUN 5*  CREATININE 0.79  GLUCOSE 149*  CALCIUM 8.3*     Discharge Medications:   Allergies as of 07/24/2018   No Known Allergies     Medication List    STOP taking these medications   aspirin EC 81 MG tablet Replaced by:  aspirin 81 MG chewable tablet     TAKE these medications   ACCU-CHEK AVIVA PLUS test strip Generic drug:  glucose blood   ACCU-CHEK SOFTCLIX LANCETS lancets   acetaminophen 500 MG tablet Commonly known as:  TYLENOL Take 1,000 mg by mouth every 8 (eight) hours as needed for moderate pain or headache.   aspirin 81 MG chewable tablet Chew 1 tablet (81 mg total) by mouth 2 (two) times daily. Replaces:  aspirin EC 81 MG tablet   diclofenac 75 MG EC tablet Commonly known as:  VOLTAREN Take 75 mg by mouth 2 (two) times daily.   hydrochlorothiazide 25 MG tablet Commonly known as:  HYDRODIURIL Take 25 mg by mouth daily.   levothyroxine 112 MCG tablet  Commonly known as:  SYNTHROID, LEVOTHROID Take 1 tablet (112 mcg total) by mouth daily before breakfast.   losartan 50 MG tablet Commonly known as:  COZAAR Take 50 mg by mouth daily.   metFORMIN 500 MG tablet Commonly known as:  GLUCOPHAGE Take 500 mg by mouth daily with breakfast.   methocarbamol 500 MG tablet Commonly known as:  ROBAXIN Take 1 tablet (500 mg total) by mouth every 6 (six) hours as needed for muscle spasms.   omeprazole 20 MG capsule Commonly known as:  PRILOSEC Take 20 mg by mouth daily.   oxyCODONE 5 MG immediate release tablet Commonly known as:  Oxy IR/ROXICODONE Take 1-2 tablets (5-10 mg total) by mouth every 4 (four) hours as needed for moderate  pain (pain score 4-6).   oxyCODONE-acetaminophen 5-325 MG tablet Commonly known as:  PERCOCET Take 1 tablet by mouth every 4 (four) hours as needed.            Durable Medical Equipment  (From admission, onward)         Start     Ordered   07/21/18 1716  DME Walker rolling  Once    Question:  Patient needs a walker to treat with the following condition  Answer:  Status post total replacement of left hip   07/21/18 1715   07/21/18 1716  DME 3 n 1  Once     07/21/18 1715          Diagnostic Studies: Dg Pelvis Portable  Result Date: 07/21/2018 CLINICAL DATA:  Status post total left hip replacement EXAM: PORTABLE PELVIS 1-2 VIEWS COMPARISON:  Fluoroscopy from earlier today FINDINGS: Total left hip arthroplasty that is located. No periprosthetic fracture in this single projection. Severe right hip osteoarthritis with bone-on-bone contact, sclerosis, and subchondral cysts. IMPRESSION: Total left hip arthroplasty without complicating feature. Electronically Signed   By: Monte Fantasia M.D.   On: 07/21/2018 16:16   Dg C-arm 1-60 Min  Result Date: 07/21/2018 CLINICAL DATA:  Status post left total hip joint prosthesis placement using the anterior approach. EXAM: OPERATIVE left HIP (WITH PELVIS IF PERFORMED) to VIEWS TECHNIQUE: Fluoroscopic spot image(s) were submitted for interpretation post-operatively. COMPARISON:  Preoperative study of May 23, 2018 FINDINGS: Reported fluoro time is 38 seconds. The patient has undergone total left hip joint prosthesis placement via the anterior approach. The interface of the prosthetic components with the native bone appears good. No acute native bone abnormality is observed. IMPRESSION: No immediate complication following left hip joint prosthesis placement. Electronically Signed   By: David  Martinique M.D.   On: 07/21/2018 15:27   Dg Hip Operative Unilat W Or W/o Pelvis Left  Result Date: 07/21/2018 CLINICAL DATA:  Status post left total hip joint  prosthesis placement using the anterior approach. EXAM: OPERATIVE left HIP (WITH PELVIS IF PERFORMED) to VIEWS TECHNIQUE: Fluoroscopic spot image(s) were submitted for interpretation post-operatively. COMPARISON:  Preoperative study of May 23, 2018 FINDINGS: Reported fluoro time is 38 seconds. The patient has undergone total left hip joint prosthesis placement via the anterior approach. The interface of the prosthetic components with the native bone appears good. No acute native bone abnormality is observed. IMPRESSION: No immediate complication following left hip joint prosthesis placement. Electronically Signed   By: David  Martinique M.D.   On: 07/21/2018 15:27    Disposition: Discharge disposition: 01-Home or [facility]    Mcarthur Rossetti, MD Follow up in 2 week(s).  Specialty:  Orthopedic Surgery Contact information: [place] [address] [phone]            Signed: Mcarthur Rossetti 07/24/2018, 6:44 AM

## 2018-07-24 NOTE — Progress Notes (Signed)
Discharge instructions (including medications) discussed with and copy provided to patient/caregiver 

## 2018-07-24 NOTE — Progress Notes (Signed)
Patient ID: Kristina Huang, female   DOB: Jul 22, 1946, 72 y.o.   MRN: [id number] Doing well overall.  Can be discharged to home today.

## 2018-07-24 NOTE — Progress Notes (Addendum)
Physical Therapy Treatment Patient Details Name: Kristina Huang MRN: [id number] DOB: 1945/11/28 Today's Date: 07/24/2018    History of Present Illness Pt is 72 y.o. female s/p elective left THA, direct anterior approach (07/21/18), secondary to osteoarthritis. PMH significant for arthritis, lumbar fusion, GERD, HTN, hyperlipidemia, diabetes mellitus, thyroid cancer.     PT Comments    Patient seen for mobility progression. Pt is making progress toward PT goals and tolerated increased gait distance of 160 ft with RW. Pt is able to ascend/descend single step simulating home entrance. Pt requested to get back to bed due to feeling "woozy" from pain medication. Current plan remains appropriate.    Follow Up Recommendations  Follow surgeon's recommendation for DC plan and follow-up therapies;Supervision for mobility/OOB     Equipment Recommendations  None recommended by PT    Recommendations for Other Services       Precautions / Restrictions Precautions Precautions: Fall Restrictions Weight Bearing Restrictions: Yes LLE Weight Bearing: Weight bearing as tolerated    Mobility  Bed Mobility Overal bed mobility: Modified Independent Bed Mobility: Supine to Sit;Sit to Supine           General bed mobility comments: cues for technique; increased time and effort  Transfers Overall transfer level: Needs assistance Equipment used: Rolling walker (2 wheeled) Transfers: Sit to/from Stand Sit to Stand: Min guard         General transfer comment: min guard for safety; no physical assist required; cues for safe hand placement  Ambulation/Gait Ambulation/Gait assistance: Min guard Gait Distance (Feet): 160 Feet Assistive device: Rolling walker (2 wheeled) Gait Pattern/deviations: Decreased stance time - left;Decreased weight shift to left;Antalgic;Step-through pattern;Step-to pattern;Decreased step length - right Gait velocity: decreased   General Gait Details: cues for  sequencing, step length symmetry, and L heel strike   Stairs Stairs: Yes Stairs assistance: Min assist Stair Management: No rails;Step to pattern;Backwards;With walker Number of Stairs: 1 General stair comments: single step to simulating home entrance (per pt) with cues for sequencing and technique   Wheelchair Mobility    Modified Rankin (Stroke Patients Only)       Balance Overall balance assessment: Needs assistance Sitting-balance support: Bilateral upper extremity supported;Feet supported Sitting balance-Leahy Scale: Fair     Standing balance support: During functional activity;Bilateral upper extremity supported Standing balance-Leahy Scale: Poor                              Cognition Arousal/Alertness: Awake/alert Behavior During Therapy: WFL for tasks assessed/performed Overall Cognitive Status: Within Functional Limits for tasks assessed                                        Exercises      General Comments        Pertinent Vitals/Pain Pain Assessment: Faces Faces Pain Scale: Hurts little more Pain Location: left hip Pain Descriptors / Indicators: Guarding;Sore Pain Intervention(s): Limited activity within patient's tolerance;Monitored during session;Repositioned;Premedicated before session    Home Living                      Prior Function            PT Goals (current goals can now be found in the care plan section) Acute Rehab PT Goals Patient Stated Goal: return to regular activity and hobbies without hip pain  Progress towards PT goals: Progressing toward goals    Frequency    7X/week      PT Plan Current plan remains appropriate    Co-evaluation              AM-PAC PT "6 Clicks" Mobility   Outcome Measure  Help needed turning from your back to your side while in a flat bed without using bedrails?: A Little Help needed moving from lying on your back to sitting on the side of a flat bed  without using bedrails?: A Little Help needed moving to and from a bed to a chair (including a wheelchair)?: A Little Help needed standing up from a chair using your arms (e.g., wheelchair or bedside chair)?: A Little Help needed to walk in hospital room?: A Little Help needed climbing 3-5 steps with a railing? : A Little 6 Click Score: 18    End of Session Equipment Utilized During Treatment: Gait belt Activity Tolerance: Patient tolerated treatment well Patient left: with call bell/phone within reach;in bed Nurse Communication: Mobility status PT Visit Diagnosis: Other abnormalities of gait and mobility (R26.89);Muscle weakness (generalized) (M62.81);Difficulty in walking, not elsewhere classified (R26.2);Pain Pain - Right/Left: Left Pain - part of body: Hip     Time: 7939-0300 PT Time Calculation (min) (ACUTE ONLY): 22 min  Charges:  $Gait Training: 8-22 mins                     Earney Navy, PTA Acute Rehabilitation Services Pager: [phone] Office: [phone]     Darliss Cheney 07/24/2018, 11:20 AM

## 2018-07-27 ENCOUNTER — Telehealth (INDEPENDENT_AMBULATORY_CARE_PROVIDER_SITE_OTHER): Payer: Self-pay | Admitting: Orthopaedic Surgery

## 2018-07-27 NOTE — Telephone Encounter (Signed)
Jody/PT/[facility] called to get verbal order for patient  1x week for 1 week 2x week for 2week  Please call Jody @ [phone]

## 2018-07-27 NOTE — Telephone Encounter (Signed)
Verbal order given  

## 2018-07-30 ENCOUNTER — Ambulatory Visit: Payer: Medicare PPO | Admitting: "Endocrinology

## 2018-08-07 ENCOUNTER — Other Ambulatory Visit: Payer: Self-pay | Admitting: "Endocrinology

## 2018-08-07 LAB — THYROGLOBULIN LEVEL: Thyroglobulin: <0.1 ng/mL — ABNORMAL LOW

## 2018-08-07 LAB — HEMOGLOBIN A1C
Hgb A1c MFr Bld: 6 % of total Hgb — ABNORMAL HIGH (ref <5.7)
Mean Plasma Glucose: 126 (calc)
eAG (mmol/L): 7 (calc)

## 2018-08-07 LAB — VITAMIN D 25 HYDROXY (VIT D DEFICIENCY, FRACTURES): VIT D 25 HYDROXY: 12 ng/mL — AB (ref 30–100)

## 2018-08-07 LAB — TSH: TSH: 0.4 mIU/L (ref 0.40–4.50)

## 2018-08-07 LAB — T4, FREE: Free T4: 1.6 ng/dL (ref 0.8–1.8)

## 2018-08-07 LAB — THYROGLOBULIN ANTIBODY: Thyroglobulin Ab: <1 IU/mL (ref <=1)

## 2018-08-07 MED ORDER — VITAMIN D (ERGOCALCIFEROL) 1.25 MG (50000 UNIT) PO CAPS: 50000.0000 [IU] | ORAL_CAPSULE | ORAL | 0 refills | Status: DC

## 2018-08-10 ENCOUNTER — Encounter (INDEPENDENT_AMBULATORY_CARE_PROVIDER_SITE_OTHER): Payer: Self-pay | Admitting: Physician Assistant

## 2018-08-10 ENCOUNTER — Ambulatory Visit (INDEPENDENT_AMBULATORY_CARE_PROVIDER_SITE_OTHER): Payer: Medicare PPO | Admitting: Physician Assistant

## 2018-08-10 DIAGNOSIS — Z96642 Presence of left artificial hip joint: Secondary | ICD-10-CM

## 2018-08-10 DIAGNOSIS — M1611 Unilateral primary osteoarthritis, right hip: Secondary | ICD-10-CM

## 2018-08-10 MED ORDER — ASPIRIN 81 MG PO CHEW: 81.0000 mg | CHEWABLE_TABLET | Freq: Once | ORAL | 0 refills | Status: AC

## 2018-08-10 MED ORDER — METHOCARBAMOL 500 MG PO TABS: 500.0000 mg | ORAL_TABLET | Freq: Four times a day (QID) | ORAL | 0 refills | Status: DC | PRN

## 2018-08-10 NOTE — Progress Notes (Signed)
HPI: Mrs. [place] comes in today 20 days status post left total hip arthroplasty.  She is overall doing well she has been taking aspirin twice daily for DVT prophylaxis.  She denies any chest pain shortness of breath.  She is having some pain in her right hip also her left knee.  Has known end-stage arthritis of the right hip.  Physical exam: General well-developed well-nourished female no acute distress mood and affect appropriate  Left hip: Staples well approximate the surgical incision no signs of infection.  Calf supple nontender.  Gentle range of motion of the left hip causes no significant discomfort and motion is fluid.  Impression: 20-day status post left total hip arthroplasty  Plan: We will have her follow-up with [place] in 4 weeks check her progress lack of.  Staples were removed Steri-Strips applied.  She is able to shower and get the incision wet.  Should dry the incision completely.  Scar tissue mobilization discussed with patient at length.  Should go on 81 mg aspirin as she was on prior to surgery once daily.

## 2018-08-13 ENCOUNTER — Encounter: Payer: Self-pay | Admitting: "Endocrinology

## 2018-08-13 ENCOUNTER — Ambulatory Visit (INDEPENDENT_AMBULATORY_CARE_PROVIDER_SITE_OTHER): Payer: Medicare PPO | Admitting: "Endocrinology

## 2018-08-13 VITALS — BP 143/89 | HR 99 | Ht 63.0 in | Wt 148.0 lb

## 2018-08-13 DIAGNOSIS — E890 Postprocedural hypothyroidism: Secondary | ICD-10-CM | POA: Diagnosis not present

## 2018-08-13 DIAGNOSIS — C73 Malignant neoplasm of thyroid gland: Secondary | ICD-10-CM

## 2018-08-13 DIAGNOSIS — E559 Vitamin D deficiency, unspecified: Secondary | ICD-10-CM | POA: Diagnosis not present

## 2018-08-13 DIAGNOSIS — R7303 Prediabetes: Secondary | ICD-10-CM

## 2018-08-13 MED ORDER — VITAMIN D (ERGOCALCIFEROL) 1.25 MG (50000 UNIT) PO CAPS: 50000.0000 [IU] | ORAL_CAPSULE | ORAL | 0 refills | Status: DC

## 2018-08-13 NOTE — Progress Notes (Signed)
Endocrinology follow-up note    Subjective:    Patient ID: Kristina Huang, female    DOB: [birth year], PCP Abran Richard, MD   Past Medical History:  Diagnosis Date  . Acid reflux   . Arthritis   . Cancer ([place])    Thyroid  . Diabetes mellitus without complication (HCC)    fasting 100-130  . Hypercholesterolemia   . Hypertension   . 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 BACK SURGERY    . BUNIONECTOMY Bilateral   . CHOLECYSTECTOMY     Dr. Irving Shows  . COLONOSCOPY  2009   Dr. Oneida Alar: moderate internal hemorrhoids, frequent sigmoid colon diverticula, polyps X 3, one large adenoma otherwise hyperplastic  . COLONOSCOPY N/A 09/09/2014   Procedure: COLONOSCOPY;  Surgeon: Danie Binder, MD;  [facility];  Service: Endoscopy;  Laterality: N/A;  200 - moved to 1/29 @ 12:45  . COLONOSCOPY N/A 10/24/2017   Procedure: COLONOSCOPY;  Surgeon: Danie Binder, MD;  [facility];  Service: Endoscopy;  Laterality: N/A;  9:30  . ESOPHAGOGASTRODUODENOSCOPY N/A 09/09/2014   Procedure: ESOPHAGOGASTRODUODENOSCOPY (EGD);  Surgeon: Danie Binder, MD;  [facility];  Service: Endoscopy;  Laterality: N/A;  . EYE SURGERY Right   . JOINT REPLACEMENT    . LAMINECTOMY  1997  . MAXIMUM ACCESS (MAS)POSTERIOR LUMBAR INTERBODY FUSION (PLIF) 2 LEVEL  11/2013   POSTERIOR LUMBAR INTERBODY FUSION POSTERIOR LATERAL ARTHRODESIS POSTERIOR NONSEGMENTAL INSTRUMENTATION  LUMBAR TWO-THREE ,LUMBAR THREE-FOUR WITH HARDWARE REMOVAL LUMBAR FOUR-FIVE/notes 11/17/2013  . THYROIDECTOMY N/A 02/05/2017   Procedure: THYROIDECTOMY;  Surgeon: Aviva Signs, MD;  Location: [facility];  Service: General;  Laterality: N/A;  . TOTAL HIP ARTHROPLASTY Left 07/21/2018  . TOTAL HIP ARTHROPLASTY Left 07/21/2018   Procedure: LEFT TOTAL HIP ARTHROPLASTY ANTERIOR APPROACH;  Surgeon: Mcarthur Rossetti, MD;  Location: [facility];   Service: Orthopedics;  Laterality: Left;  . TUBAL LIGATION     Social History   Socioeconomic History  . Marital status: Divorced    Spouse name: Not on file  . Number of children: Not on file  . Years of education: Not on file  . Highest education level: Not on file  Occupational History  . Not on file  Social Needs  . Financial resource strain: Not on file  . Food insecurity:    Worry: Not on file    Inability: Not on file  . Transportation needs:    Medical: Not on file    Non-medical: Not on file  Tobacco Use  . Smoking status: Former Smoker    Packs/day: 0.25    Years: 2.00    Pack years: 0.50    Types: Cigarettes    Last attempt to quit: 01/31/2010    Years since quitting: 8.5  . Smokeless tobacco: Never Used  Substance and Sexual Activity  . Alcohol use: No    Alcohol/week: 0.0 standard drinks  . Drug use: No  . Sexual activity: Not Currently    Birth control/protection: Surgical  Lifestyle  . Physical activity:    Days per week: Not on file    Minutes per session: Not on file  . Stress: Not on file  Relationships  . Social connections:    Talks on phone: Not on file    Gets together: Not on file    Attends religious service: Not on file    Active  member of club or organization: Not on file    Attends meetings of clubs or organizations: Not on file    Relationship status: Not on file  Other Topics Concern  . Not on file  Social History Narrative  . Not on file   Outpatient Encounter Medications as of 08/13/2018  Medication Sig  . ACCU-CHEK AVIVA PLUS test strip   . ACCU-CHEK SOFTCLIX LANCETS lancets   . acetaminophen (TYLENOL) 500 MG tablet Take 1,000 mg by mouth every 8 (eight) hours as needed for moderate pain or headache.   . diclofenac (VOLTAREN) 75 MG EC tablet Take 75 mg by mouth 2 (two) times daily.   . hydrochlorothiazide (HYDRODIURIL) 25 MG tablet Take 25 mg by mouth daily.  Marland Kitchen levothyroxine (SYNTHROID, LEVOTHROID) 112 MCG tablet Take 1 tablet  (112 mcg total) by mouth daily before breakfast.  . losartan (COZAAR) 50 MG tablet Take 50 mg by mouth daily.   . metFORMIN (GLUCOPHAGE) 500 MG tablet Take 500 mg by mouth daily with breakfast.  . methocarbamol (ROBAXIN) 500 MG tablet Take 1 tablet (500 mg total) by mouth every 6 (six) hours as needed for muscle spasms.  Marland Kitchen omeprazole (PRILOSEC) 20 MG capsule Take 20 mg by mouth daily.  Marland Kitchen oxyCODONE (OXY IR/ROXICODONE) 5 MG immediate release tablet Take 1-2 tablets (5-10 mg total) by mouth every 4 (four) hours as needed for moderate pain (pain score 4-6). (Patient not taking: Reported on 08/13/2018)  . Vitamin D, Ergocalciferol, (DRISDOL) 1.25 MG (50000 UT) CAPS capsule Take 1 capsule (50,000 Units total) by mouth every 7 (seven) days.  . [DISCONTINUED] Vitamin D, Ergocalciferol, (DRISDOL) 1.25 MG (50000 UT) CAPS capsule Take 1 capsule (50,000 Units total) by mouth every 7 (seven) days. (Patient not taking: Reported on 08/13/2018)   No facility-administered encounter medications on file as of 08/13/2018.    ALLERGIES: No Known Allergies  VACCINATION STATUS:  There is no immunization history on file for this patient.  HPI 73 year old female patient with medical history as above. She is being seen in follow up  for locally invasive multifocal papillary thyroid cancer, status post thyroidectomy and post thyroidectomy hypothyroidism. - She underwent total thyroidectomy on 02/05/2017 which revealed a  papillary thyroid carcinoma on the right lower thyroid-0.5 cm foci with local extrathyroidal extension and another 0.2 cm separate tumor in the right lobe. The left lobe was said to be consistent with hyperplastic nodule. - She underwent I-131 thyroid remnant ablation on 04/15/2017 followed by post therapy whole body scan. -  On the scan, she was found to have foci of abnormal radioiodine accumulation in the cervical region, suggestive of thyroid remnant or metastatic cervical lymph node.  -Her previsit  thyroid/neck ultrasound confirms decrease in size of this lymph node to 0.3 cm and no new concerns.  -She denies dysphagia, shortness of breath, nor voice change. -She is currently on levothyroxine 112 mcg p.o. daily before breakfast.  She reports compliance to this medication.  - She was known to have multinodular goiter for at least 7 years. - Series of thyroid/neck ultrasound results showed that the thyroid continued to grow over time as well as increasing nodule size in bilateral lobes. - she also had  Feelings of choking sensations on and off and shortness of breath intermittently-symptoms have significantly improved since her surgery. - She denies any family history of thyroid cancer, however one of her sisters have hyperthyroidism status post treatment with what appears to be radioactive iodine therapy.  Review of Systems  Constitutional: + Steady weight, - fatigue, no subjective hyperthermia, no subjective hypothermia,  Eyes: no blurry vision, no xerophthalmia ENT: no sore throat, + thyroidectomy, - dysphagia/odynophagia, no hoarseness Cardiovascular: no Chest Pain, no Shortness of Breath, no palpitations, no leg swelling Respiratory: no cough, no SOB Gastrointestinal: no nausea, no vomiting, no diarrhea.  Musculoskeletal: no muscle/joint aches, + she underwent left hip replacement since her last visit currently walks with a cane. Skin: no rashes Neurological: no tremors, no numbness, no tingling, no dizziness Psychiatric: no depression, no anxiety  Objective:    BP (!) 143/89   Pulse 99   Ht 5\' 3"  (1.6 m)   Wt 148 lb (67.1 kg)   BMI 26.22 kg/m   Wt Readings from Last 3 Encounters:  08/13/18 148 lb (67.1 kg)  07/21/18 150 lb (68 kg)  07/15/18 150 lb 11.2 oz (68.4 kg)    Physical Exam  Constitutional: + over weight for height, not in acute distress, normal state of mind Eyes: PERRLA, EOMI, no exophthalmos ENT: moist mucous membranes, + well-healed  post thyroidectomy scar  on anterior lower neck , no cervical lymphadenopathy  Musculoskeletal: no gross deformities, strength intact in all four extremities Skin: moist, warm, no rashes, walks with a cane related to her recent left hip replacement. Neurological: no tremor with outstretched hands   CMP ( most recent) CMP     Component Value Date/Time   NA 138 07/22/2018 0236   K 3.2 (L) 07/22/2018 0236   CL 98 07/22/2018 0236   CO2 30 07/22/2018 0236   GLUCOSE 149 (H) 07/22/2018 0236   BUN 5 (L) 07/22/2018 0236   CREATININE 0.79 07/22/2018 0236   CREATININE 0.80 03/26/2018 1041   CALCIUM 8.3 (L) 07/22/2018 0236   PROT 7.2 03/26/2018 1041   ALBUMIN 3.2 (L) 02/06/2017 0444   AST 16 03/26/2018 1041   ALT 15 03/26/2018 1041   ALKPHOS 68 02/06/2017 0444   BILITOT 0.4 03/26/2018 1041   GFRNONAA >60 07/22/2018 0236   GFRNONAA 74 03/26/2018 1041   GFRAA >60 07/22/2018 0236   GFRAA 85 03/26/2018 1041        Fine-needle aspiration cytology of the thyroid on 10/23/2016 showed: THYROID, FINE NEEDLE ASPIRATION, LEFT (SPECIMEN 1 OF 1, COLLECTED 10/23/16): CONSISTENT WITH BENIGN FOLLICULAR NODULE (BETHESDA CATEGORY II).  02/05/2017: She underwent total thyroidectomy: 02/05/2017: INAL DIAGNOSIS Diagnosis 1. Thyroid, lobectomy, left - NODULAR HYPERPLASIA (MULTINODULAR GOITER). 2. Thyroid, lobectomy, right - PAPILLARY THYROID CARCINOMA, TWO TUMORS, 0.5 AND 0.2 CM. - PAPILLARY CARCINOMA FOCALLY EXTENDS INTO THE PERITHYROIDAL CONNECTIVE TISSUE. - HURTHLE CELL ADENOMATOUS NODULE. - NODULAR HYPERPLASIA.  TNM code: pT1a , pNX.   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  No distant sites of iodine-avid metastatic thyroid cancer Identified.  Surveillance thyroid/neck ultrasound from 05/19/2017 showed:  Surgically absent thyroid lobes. A single morphologically unremarkable 0.8 cm left  cervical lymph node is noted. No pathologic adenopathy.  IMPRESSION: No evidence of residual/ recurrent tissue post thyroidectomy   Thyroid/neck ultrasound on February 23, 2018 IMPRESSION: 1. Post total thyroidectomy without evidence of residual nodular tissue within the thyroidectomy resection bed. 2. Solitary left-sided cervical lymph node is not enlarged by size criteria measuring 0.3 cm in diameter. No definitive bulky regional cervical lymphadenopathy.    Results for YAZMINA, PAREJA (MRN [id number]) as of 08/13/2018 10:39  Ref. Range 08/06/2018 12:09  eAG (mmol/L) Latest Units: (calc) 7.0  Hemoglobin A1C Latest Ref Range: <5.7 % of total Hgb 6.0 (H)  TSH Latest Ref Range: 0.40 - 4.50 Huang/L 0.40  T4,Free(Direct) Latest Ref Range: 0.8 - 1.8 ng/dL 1.6  Thyroglobulin Latest Units: ng/mL <0.1 (L)  Thyroglobulin Ab Latest Ref Range: < or = 1 IU/mL <1    Assessment & Plan:   1. 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 Her surgical sample unexpectedly revealed occult malignancy on the right lobe of the thyroid, 0.5 cm  with focal extrathyroidal extension and 0.2 cm foci.  TNM code: pT1a , pNX. - She is status post total thyroidectomy and I-131 thyroid remnant ablation on 04/15/2017.  - Thyroglobulin level and thyroglobulin antibodies are  low  on the day of or body scan. 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  No distant sites of iodine-avid metastatic thyroid cancer Identified.  - Surveillance  thyroid/neck ultrasound - is unremarkable except for nonspecific 0.8 cm lymph node on the left cervical region.   Subsequent thyroid/neck ultrasound on February 23, 2018 showed decreasing size of his nonspecific lymph node to 0.3 cm. -She will be  considered for CT neck before her next visit .    2. Postsurgical hypothyroidism  -Her thyroid function tests are consistent with appropriate replacement with appropriately suppressed TSH.   She is advised to continue levothyroxine 112 mcg p.o. daily before breakfast.   - The goal of therapy in thyroid cancer suppressive therapy is to keep TSH between 0.1-0.5 .   - We discussed about correct intake of levothyroxine, at fasting, with water, separated by at least 30 minutes from breakfast, and separated by more than 4 hours from calcium, iron, multivitamins, acid reflux medications (PPIs). -Patient is made aware of the fact that thyroid hormone replacement is needed for life, dose to be adjusted by periodic monitoring of thyroid function tests.  3.  Vitamin D deficiency -She is advised to pick up and continue vitamin D2 50,000 units weekly for 12 weeks.  4.  Prediabetes with A1c 6% -Will not require intervention at this time.  - I advised patient to maintain close follow up with Abran Richard, MD for primary care needs.  Follow up plan: Return in about 3 months (around 11/12/2018), or CT Neck/Thyroid , for Follow up with Pre-visit Labs.  Glade Lloyd, MD Phone: [phone]  Fax: [phone]  This note was partially dictated with voice recognition software. Similar sounding words can be transcribed inadequately or may not  be corrected upon review.  08/13/2018, 11:02 AM

## 2018-09-07 ENCOUNTER — Ambulatory Visit (INDEPENDENT_AMBULATORY_CARE_PROVIDER_SITE_OTHER): Payer: Medicare PPO | Admitting: Physician Assistant

## 2018-09-08 ENCOUNTER — Ambulatory Visit (INDEPENDENT_AMBULATORY_CARE_PROVIDER_SITE_OTHER): Payer: Medicare PPO | Admitting: Orthopaedic Surgery

## 2018-09-08 ENCOUNTER — Encounter (INDEPENDENT_AMBULATORY_CARE_PROVIDER_SITE_OTHER): Payer: Self-pay | Admitting: Orthopaedic Surgery

## 2018-09-08 DIAGNOSIS — Z96642 Presence of left artificial hip joint: Secondary | ICD-10-CM

## 2018-09-08 NOTE — Progress Notes (Signed)
HPI: Kristina Huang returns today follow-up left total hip arthroplasty 07/21/2018.  She is overall doing very well.  She states her range of motion strength improving.  She has no concerns.  She does have known end-stage arthritis of her right hip currently not bothering her whole life.  She would like to proceed with right total hip sometime in the future but is not ready quite yet.  She is using a cane to ambulate due to the right hip.  Physical exam: Left hip excellent range of motion without pain.  Calf supple nontender dorsiflexion plantarflexion bilateral ankles intact.  Impression: Status post left total hip arthroplasty 49 days  Right hip end-stage osteoarthritis  Plan: Have her follow-up with [place] in 6 months to see how she is doing in regards to her left hip and see if she is ready for right hip surgery.  Questions encouraged and answered at length.  Should follow-up sooner if there is any questions or concerns.

## 2018-10-16 ENCOUNTER — Other Ambulatory Visit (HOSPITAL_COMMUNITY): Payer: Self-pay | Admitting: Internal Medicine

## 2018-10-16 DIAGNOSIS — Z1231 Encounter for screening mammogram for malignant neoplasm of breast: Secondary | ICD-10-CM

## 2018-10-28 ENCOUNTER — Other Ambulatory Visit: Payer: Self-pay

## 2018-10-28 ENCOUNTER — Ambulatory Visit (HOSPITAL_COMMUNITY)
Admission: RE | Admit: 2018-10-28 | Discharge: 2018-10-28 | Disposition: A | Discharge status: Home / Self Care | Payer: Medicare PPO | Source: Ambulatory Visit | Attending: Internal Medicine | Admitting: Internal Medicine

## 2018-10-28 DIAGNOSIS — Z1231 Encounter for screening mammogram for malignant neoplasm of breast: Secondary | ICD-10-CM | POA: Insufficient documentation

## 2018-11-05 LAB — THYROGLOBULIN LEVEL: Thyroglobulin: <0.1 ng/mL — ABNORMAL LOW

## 2018-11-05 LAB — TSH: TSH: 0.2 mIU/L — ABNORMAL LOW (ref 0.40–4.50)

## 2018-11-05 LAB — THYROGLOBULIN ANTIBODY: Thyroglobulin Ab: <1 IU/mL (ref <=1)

## 2018-11-05 LAB — T4, FREE: Free T4: 1.5 ng/dL (ref 0.8–1.8)

## 2018-11-12 ENCOUNTER — Encounter: Payer: Self-pay | Admitting: "Endocrinology

## 2018-11-12 ENCOUNTER — Ambulatory Visit (INDEPENDENT_AMBULATORY_CARE_PROVIDER_SITE_OTHER): Payer: Medicare PPO | Admitting: "Endocrinology

## 2018-11-12 ENCOUNTER — Ambulatory Visit: Payer: Medicare PPO | Admitting: "Endocrinology

## 2018-11-12 DIAGNOSIS — E890 Postprocedural hypothyroidism: Secondary | ICD-10-CM | POA: Diagnosis not present

## 2018-11-12 DIAGNOSIS — C73 Malignant neoplasm of thyroid gland: Secondary | ICD-10-CM

## 2018-11-12 MED ORDER — LEVOTHYROXINE SODIUM 112 MCG PO TABS: 112.0000 ug | ORAL_TABLET | Freq: Every day | ORAL | 6 refills | Status: DC

## 2018-11-12 NOTE — Progress Notes (Signed)
Endocrinology Telephone Visit Follow up Note -During COVID -19 Pandemic   Subjective:    Patient ID: Kristina Huang, female    DOB: Apr 16, 1946, PCP Abran Richard, MD   Past Medical History:  Diagnosis Date  . Acid reflux   . Arthritis   . Cancer ([place])    Thyroid  . Diabetes mellitus without complication (HCC)    fasting 100-130  . Hypercholesterolemia   . Hypertension   . 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 BACK SURGERY    . BUNIONECTOMY Bilateral   . CHOLECYSTECTOMY     Dr. Irving Shows  . COLONOSCOPY  2009   Dr. Oneida Alar: moderate internal hemorrhoids, frequent sigmoid colon diverticula, polyps X 3, one large adenoma otherwise hyperplastic  . COLONOSCOPY N/A 09/09/2014   Procedure: COLONOSCOPY;  Surgeon: Danie Binder, MD;  [facility];  Service: Endoscopy;  Laterality: N/A;  200 - moved to 1/29 @ 12:45  . COLONOSCOPY N/A 10/24/2017   Procedure: COLONOSCOPY;  Surgeon: Danie Binder, MD;  [facility];  Service: Endoscopy;  Laterality: N/A;  9:30  . ESOPHAGOGASTRODUODENOSCOPY N/A 09/09/2014   Procedure: ESOPHAGOGASTRODUODENOSCOPY (EGD);  Surgeon: Danie Binder, MD;  [facility];  Service: Endoscopy;  Laterality: N/A;  . EYE SURGERY Right   . JOINT REPLACEMENT    . LAMINECTOMY  1997  . MAXIMUM ACCESS (MAS)POSTERIOR LUMBAR INTERBODY FUSION (PLIF) 2 LEVEL  11/2013   POSTERIOR LUMBAR INTERBODY FUSION POSTERIOR LATERAL ARTHRODESIS POSTERIOR NONSEGMENTAL INSTRUMENTATION  LUMBAR TWO-THREE ,LUMBAR THREE-FOUR WITH HARDWARE REMOVAL LUMBAR FOUR-FIVE/notes 11/17/2013  . THYROIDECTOMY N/A 02/05/2017   Procedure: THYROIDECTOMY;  Surgeon: Aviva Signs, MD;  Location: [facility];  Service: General;  Laterality: N/A;  . TOTAL HIP ARTHROPLASTY Left 07/21/2018  . TOTAL HIP ARTHROPLASTY Left 07/21/2018   Procedure: LEFT TOTAL HIP  ARTHROPLASTY ANTERIOR APPROACH;  Surgeon: Mcarthur Rossetti, MD;  Location: [facility];  Service: Orthopedics;  Laterality: Left;  . TUBAL LIGATION     Social History   Socioeconomic History  . Marital status: Divorced    Spouse name: Not on file  . Number of children: Not on file  . Years of education: Not on file  . Highest education level: Not on file  Occupational History  . Not on file  Social Needs  . Financial resource strain: Not on file  . Food insecurity:    Worry: Not on file    Inability: Not on file  . Transportation needs:    Medical: Not on file    Non-medical: Not on file  Tobacco Use  . Smoking status: Former Smoker    Packs/day: 0.25    Years: 2.00    Pack years: 0.50    Types: Cigarettes    Last attempt to quit: 01/31/2010    Years since quitting: 8.7  . Smokeless tobacco: Never Used  Substance and Sexual Activity  . Alcohol use: No    Alcohol/week: 0.0 standard drinks  . Drug use: No  . Sexual activity: Not Currently    Birth control/protection: Surgical  Lifestyle  . Physical activity:  Days per week: Not on file    Minutes per session: Not on file  . Stress: Not on file  Relationships  . Social connections:    Talks on phone: Not on file    Gets together: Not on file    Attends religious service: Not on file    Active member of club or organization: Not on file    Attends meetings of clubs or organizations: Not on file    Relationship status: Not on file  Other Topics Concern  . Not on file  Social History Narrative  . Not on file   Outpatient Encounter Medications as of 11/12/2018  Medication Sig  . ACCU-CHEK AVIVA PLUS test strip   . ACCU-CHEK SOFTCLIX LANCETS lancets   . acetaminophen (TYLENOL) 500 MG tablet Take 1,000 mg by mouth every 8 (eight) hours as needed for moderate pain or headache.   . diclofenac (VOLTAREN) 75 MG EC tablet Take 75 mg by mouth 2 (two) times daily.   . hydrochlorothiazide (HYDRODIURIL) 25 MG tablet Take 25  mg by mouth daily.  Marland Kitchen levothyroxine (SYNTHROID, LEVOTHROID) 112 MCG tablet Take 1 tablet (112 mcg total) by mouth daily before breakfast.  . losartan (COZAAR) 50 MG tablet Take 50 mg by mouth daily.   . metFORMIN (GLUCOPHAGE) 500 MG tablet Take 500 mg by mouth daily with breakfast.  . methocarbamol (ROBAXIN) 500 MG tablet Take 1 tablet (500 mg total) by mouth every 6 (six) hours as needed for muscle spasms.  Marland Kitchen omeprazole (PRILOSEC) 20 MG capsule Take 20 mg by mouth daily.  Marland Kitchen oxyCODONE (OXY IR/ROXICODONE) 5 MG immediate release tablet Take 1-2 tablets (5-10 mg total) by mouth every 4 (four) hours as needed for moderate pain (pain score 4-6). (Patient not taking: Reported on 08/13/2018)  . Vitamin D, Ergocalciferol, (DRISDOL) 1.25 MG (50000 UT) CAPS capsule Take 1 capsule (50,000 Units total) by mouth every 7 (seven) days.  . [DISCONTINUED] levothyroxine (SYNTHROID, LEVOTHROID) 112 MCG tablet Take 1 tablet (112 mcg total) by mouth daily before breakfast.   No facility-administered encounter medications on file as of 11/12/2018.    ALLERGIES: No Known Allergies  VACCINATION STATUS:  There is no immunization history on file for this patient.  HPI 73 year old female patient with medical history as above.  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  - She underwent total thyroidectomy on 02/05/2017 which revealed a  papillary thyroid carcinoma on the right lower thyroid-0.5 cm foci with local extrathyroidal extension and another 0.2 cm separate tumor in the right lobe. The left lobe was said to be consistent with hyperplastic nodule. - She underwent I-131 thyroid remnant ablation on 04/15/2017 followed by post therapy whole body scan. -  On the scan, she was found to have foci of abnormal radioiodine accumulation in the cervical region, suggestive of thyroid remnant or  metastatic cervical lymph node.  -Her previsit thyroid/neck ultrasound confirms decrease in size of this lymph node to 0.3 cm and no new concerns.  -She denies dysphagia, shortness of breath, nor voice change. -She is currently on levothyroxine 112 mcg p.o. daily before breakfast.  she reports compliance to this medication.  - She was known to have multinodular goiter for at least 7 years. - Series of thyroid/neck ultrasound results showed that the thyroid continued to grow over time as well as increasing nodule size in bilateral lobes. -He denies dysphagia, shortness of  breath, nor voice change. - She denies any family history of thyroid cancer, however one of her sisters have hyperthyroidism status post treatment with what appears to be radioactive iodine therapy.    Objective:    There were no vitals taken for this visit.  Wt Readings from Last 3 Encounters:  08/13/18 148 lb (67.1 kg)  07/21/18 150 lb (68 kg)  07/15/18 150 lb 11.2 oz (68.4 kg)       CMP ( most recent) CMP     Component Value Date/Time   NA 138 07/22/2018 0236   K 3.2 (L) 07/22/2018 0236   CL 98 07/22/2018 0236   CO2 30 07/22/2018 0236   GLUCOSE 149 (H) 07/22/2018 0236   BUN 5 (L) 07/22/2018 0236   CREATININE 0.79 07/22/2018 0236   CREATININE 0.80 03/26/2018 1041   CALCIUM 8.3 (L) 07/22/2018 0236   PROT 7.2 03/26/2018 1041   ALBUMIN 3.2 (L) 02/06/2017 0444   AST 16 03/26/2018 1041   ALT 15 03/26/2018 1041   ALKPHOS 68 02/06/2017 0444   BILITOT 0.4 03/26/2018 1041   GFRNONAA >60 07/22/2018 0236   GFRNONAA 74 03/26/2018 1041   GFRAA >60 07/22/2018 0236   GFRAA 85 03/26/2018 1041        Fine-needle aspiration cytology of the thyroid on 10/23/2016 showed: THYROID, FINE NEEDLE ASPIRATION, LEFT (SPECIMEN 1 OF 1, COLLECTED 10/23/16): CONSISTENT WITH BENIGN FOLLICULAR NODULE (BETHESDA CATEGORY II).  02/05/2017: She underwent total thyroidectomy: 02/05/2017: INAL DIAGNOSIS Diagnosis 1. Thyroid,  lobectomy, left - NODULAR HYPERPLASIA (MULTINODULAR GOITER). 2. Thyroid, lobectomy, right - PAPILLARY THYROID CARCINOMA, TWO TUMORS, 0.5 AND 0.2 CM. - PAPILLARY CARCINOMA FOCALLY EXTENDS INTO THE PERITHYROIDAL CONNECTIVE TISSUE. - HURTHLE CELL ADENOMATOUS NODULE. - NODULAR HYPERPLASIA.  TNM code: pT1a , pNX.   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  No distant sites of iodine-avid metastatic thyroid cancer Identified.  Surveillance thyroid/neck ultrasound from 05/19/2017 showed:  Surgically absent thyroid lobes. A single morphologically unremarkable 0.8 cm left cervical lymph node is noted. No pathologic adenopathy.  IMPRESSION: No evidence of residual/ recurrent tissue post thyroidectomy   Thyroid/neck ultrasound on February 23, 2018 IMPRESSION: 1. Post total thyroidectomy without evidence of residual nodular tissue within the thyroidectomy resection bed. 2. Solitary left-sided cervical lymph node is not enlarged by size criteria measuring 0.3 cm in diameter. No definitive bulky regional cervical lymphadenopathy.   Recent Results (from the past 2160 hour(s))  TSH     Status: Abnormal   Collection Time: 11/04/18 12:01 PM  Result Value Ref Range   TSH 0.20 (L) 0.40 - 4.50 Huang/L  T4, free     Status: None   Collection Time: 11/04/18 12:01 PM  Result Value Ref Range   Free T4 1.5 0.8 - 1.8 ng/dL  Thyroglobulin antibody     Status: None   Collection Time: 11/04/18 12:01 PM  Result Value Ref Range   Thyroglobulin Ab <1 < or = 1 IU/mL  Thyroglobulin Level     Status: Abnormal   Collection Time: 11/04/18 12:01 PM  Result Value Ref Range   Thyroglobulin <0.1 (L) ng/mL    Comment:       Reference Range:       Intact Thyroid   2.8-40.9       Athyrotic        <0.1 .       Note: Abnormal flagging is based  on the reference interval for         patients with intact thyroid. . . This test was performed using the Beckman Coulter  chemiluminescent method. Values obtained from different assay methods cannot be used interchangeably. Thyroglobulin levels, regardless of value, should not be interpreted as absolute evidence of the presence or absence of disease. .    Comment      Comment: . Thyroglobulin antibodies (TGAB) interfere with thyroglobulin (TG) assays; therefore, TGAB assay should always be performed in conjunction with a TG assay. . . For additional information, please refer to  http://education.quest[employer].com/faq/FAQ202  (This link is being provided for informational/ educational purposes only.) .      Assessment & Plan:   1. 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 Her surgical sample unexpectedly revealed occult malignancy on the right lobe of the thyroid, 0.5 cm  with focal extrathyroidal extension and 0.2 cm foci.  TNM code: pT1a , pNX. - She is status post total thyroidectomy and I-131 thyroid remnant ablation on 04/15/2017.  - Thyroglobulin level and thyroglobulin antibodies are  low  on the day of or body scan. 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  No distant sites of iodine-avid metastatic thyroid cancer Identified.  - Surveillance  thyroid/neck ultrasound - is unremarkable except for nonspecific 0.8 cm lymph node on the left cervical region.   Subsequent thyroid/neck ultrasound on February 23, 2018 showed decreasing size of his nonspecific lymph node to 0.3 cm. -She will be considered for thyroid/neck ultrasound before her next visit in 6 months.      2. Postsurgical hypothyroidism  -Her thyroid function tests are consistent with appropriate replacement  with appropriately suppressed TSH.   She is advised to continue levothyroxine 112 mcg p.o. daily before breakfast.   - The goal of therapy in thyroid cancer suppressive therapy is to keep TSH between 0.1-0.5 .   - We discussed about the correct intake of her thyroid hormone, on empty stomach at fasting, with water, separated by at least 30 minutes from breakfast and other medications,  and separated by more than 4 hours from calcium, iron, multivitamins, acid reflux medications (PPIs). -Patient is made aware of the fact that thyroid hormone replacement is needed for life, dose to be adjusted by periodic monitoring of thyroid function tests.    - I advised patient to maintain close follow up with Abran Richard, MD for primary care needs.  Follow up plan: Return in about 6 months (around 05/14/2019) for Follow up with Pre-visit Labs, Thyroid / Neck Ultrasound.  Glade Lloyd, MD Phone: [phone]  Fax: [phone]  This note was partially dictated with voice recognition software. Similar sounding words can be transcribed inadequately or may not  be corrected upon review.  11/12/2018, 11:48 AM

## 2018-11-13 ENCOUNTER — Ambulatory Visit: Payer: Medicare PPO | Admitting: "Endocrinology

## 2019-03-09 ENCOUNTER — Ambulatory Visit (INDEPENDENT_AMBULATORY_CARE_PROVIDER_SITE_OTHER): Payer: Medicare PPO

## 2019-03-09 ENCOUNTER — Ambulatory Visit (INDEPENDENT_AMBULATORY_CARE_PROVIDER_SITE_OTHER): Payer: Medicare PPO | Admitting: Orthopaedic Surgery

## 2019-03-09 ENCOUNTER — Encounter: Payer: Self-pay | Admitting: Orthopaedic Surgery

## 2019-03-09 VITALS — Ht 63.0 in | Wt 150.0 lb

## 2019-03-09 DIAGNOSIS — Z96642 Presence of left artificial hip joint: Secondary | ICD-10-CM | POA: Diagnosis not present

## 2019-03-09 DIAGNOSIS — M25551 Pain in right hip: Secondary | ICD-10-CM

## 2019-03-09 DIAGNOSIS — M1611 Unilateral primary osteoarthritis, right hip: Secondary | ICD-10-CM | POA: Diagnosis not present

## 2019-03-09 NOTE — Progress Notes (Signed)
Office Visit Note   Patient: Kristina Huang           Date of Birth: 10/03/1945           MRN: [id number] Visit Date: 03/09/2019              Requested by: Abran Richard, MD [address] [place],  [place] [address] PCP: Abran Richard, MD   Assessment & Plan: Visit Diagnoses:  1. History of left hip replacement   2. Pain in right hip   3. Unilateral primary osteoarthritis, right hip     Plan: I do feel it is appropriate at this point to schedule her for right total hip arthroplasty given the severity of her arthritis and the detrimental effect this is having on her mobility, her quality of life and her activities daily living.  It is definitely warranted based on her clinical exam and x-ray findings.  Also given the failure of conservative treatment for over a year.  Having had this done on her left hip 7 months ago, she is fully aware of the risk and benefits of surgery and what the surgery involves including her interoperative and postoperative course.  We will work on getting a schedule in the near future.  All question concerns were answered and addressed.  Follow-Up Instructions: Return for 2 weeks post-op.   Orders:  Orders Placed This Encounter  Procedures  . XR HIPS BILAT W OR W/O PELVIS 2V   No orders of the defined types were placed in this encounter.     Procedures: No procedures performed   Clinical Data: No additional findings.   Subjective: Chief Complaint  Patient presents with  . Left Hip - Follow-up  . Right Hip - Pain  The patient is well-known to me.  She is now 7 months status post a left total hip arthroplasty to treat the pain from severe osteoarthritis.  She already has known severe osteoarthritis of her right hip.  This is been well-documented for well over a year now with x-ray showing complete loss of the right hip joint space.  She does still have ambulate with a cane.  She is tried intra-articular steroid injections on the right hip as  well as hip strengthening exercises, activity modification, rest and anti-inflammatories.  At this point her right hip pain is 10 out of 10 and it is definitely affecting her mobility, her quality of life, and her activities daily living.  Her left total hip arthroplasty is done well and she is pain-free on her left hip.  She is still a fall risk as it relates to the severe arthritis in her right hip.  She does wish at this point to proceed with a right total hip arthroplasty.  HPI  Review of Systems She currently denies any headache, chest pain, shortness of breath, fever, chills, nausea, vomiting  Objective: Vital Signs: Ht 5\' 3"  (1.6 m)   Wt 150 lb (68 kg)   BMI 26.57 kg/m   Physical Exam She is alert and orient x3 and in no acute distress.  She is ambulate with a cane with a significant limp involving the right side. Ortho Exam Her left operative hip moves smoothly and is full motion with no pain at all.  Her right hip has significant stiffness with attempts of internal and external rotation with severe pain with rotation. Specialty Comments:  No specialty comments available.  Imaging: Xr Hips Bilat W Or W/o Pelvis 2v  Result Date:  03/09/2019 An [facility] pelvis and bilateral hip films shows a well-seated left total hip arthroplasty with no complicating features or evidence of loosening.  The right hip has severe end-stage arthritis.  There is essentially no joint space remaining.  There is cystic changes in the femoral head and the acetabulum.  There periarticular osteophytes around the hip on the right side.    PMFS History: Patient Active Problem List   Diagnosis Date Noted  . Unilateral primary osteoarthritis, right hip 03/09/2019  . Vitamin D deficiency 08/13/2018  . Prediabetes 08/13/2018  . Unilateral primary osteoarthritis, left hip 07/21/2018  . Status post total replacement of left hip 07/21/2018  . Malignant neoplasm of thyroid gland ([place]) 03/18/2017  . Postsurgical  hypothyroidism 03/18/2017  . Multinodular goiter 12/10/2016  . Personal history of colonic polyps 08/23/2014  . GERD (gastroesophageal reflux disease) 08/23/2014  . Chronic diarrhea 08/23/2014  . Spondylolisthesis at L3-L4 level 11/17/2013  . COLONIC POLYPS, HYPERPLASTIC 02/03/2009  . HYPERLIPIDEMIA 02/03/2009  . HYPERTENSION 02/03/2009  . GERD 02/03/2009  . ARTHRITIS 02/03/2009   Past Medical History:  Diagnosis Date  . Acid reflux   . Arthritis   . Cancer ([place])    Thyroid  . Diabetes mellitus without complication (HCC)    fasting 100-130  . Hypercholesterolemia   . Hypertension   . Thyroid goiter    right tracheal deviation by CXR 10/2013    Family History  Problem Relation Age of Onset  . Heart disease Mother   . Heart disease Father   . Colon cancer Neg Hx     Past Surgical History:  Procedure Laterality Date  . BACK SURGERY    . BUNIONECTOMY Bilateral   . CHOLECYSTECTOMY     Dr. Irving Shows  . COLONOSCOPY  2009   Dr. Oneida Alar: moderate internal hemorrhoids, frequent sigmoid colon diverticula, polyps X 3, one large adenoma otherwise hyperplastic  . COLONOSCOPY N/A 09/09/2014   Procedure: COLONOSCOPY;  Surgeon: Danie Binder, MD;  [facility];  Service: Endoscopy;  Laterality: N/A;  200 - moved to 1/29 @ 12:45  . COLONOSCOPY N/A 10/24/2017   Procedure: COLONOSCOPY;  Surgeon: Danie Binder, MD;  [facility];  Service: Endoscopy;  Laterality: N/A;  9:30  . ESOPHAGOGASTRODUODENOSCOPY N/A 09/09/2014   Procedure: ESOPHAGOGASTRODUODENOSCOPY (EGD);  Surgeon: Danie Binder, MD;  [facility];  Service: Endoscopy;  Laterality: N/A;  . EYE SURGERY Right   . JOINT REPLACEMENT    . LAMINECTOMY  1997  . MAXIMUM ACCESS (MAS)POSTERIOR LUMBAR INTERBODY FUSION (PLIF) 2 LEVEL  11/2013   POSTERIOR LUMBAR INTERBODY FUSION POSTERIOR LATERAL ARTHRODESIS POSTERIOR NONSEGMENTAL INSTRUMENTATION  LUMBAR TWO-THREE ,LUMBAR THREE-FOUR WITH HARDWARE REMOVAL  LUMBAR FOUR-FIVE/notes 11/17/2013  . THYROIDECTOMY N/A 02/05/2017   Procedure: THYROIDECTOMY;  Surgeon: Aviva Signs, MD;  Location: [facility];  Service: General;  Laterality: N/A;  . TOTAL HIP ARTHROPLASTY Left 07/21/2018  . TOTAL HIP ARTHROPLASTY Left 07/21/2018   Procedure: LEFT TOTAL HIP ARTHROPLASTY ANTERIOR APPROACH;  Surgeon: Mcarthur Rossetti, MD;  Location: [facility];  Service: Orthopedics;  Laterality: Left;  . TUBAL LIGATION     Social History   Occupational History  . Not on file  Tobacco Use  . Smoking status: Former Smoker    Packs/day: 0.25    Years: 2.00    Pack years: 0.50    Types: Cigarettes    Quit date: 01/31/2010    Years since quitting: 9.1  . Smokeless tobacco: Never Used  Substance  and Sexual Activity  . Alcohol use: No    Alcohol/week: 0.0 standard drinks  . Drug use: No  . Sexual activity: Not Currently    Birth control/protection: Surgical

## 2019-03-31 ENCOUNTER — Other Ambulatory Visit: Payer: Self-pay | Admitting: "Endocrinology

## 2019-03-31 ENCOUNTER — Telehealth: Payer: Self-pay | Admitting: "Endocrinology

## 2019-03-31 DIAGNOSIS — E890 Postprocedural hypothyroidism: Secondary | ICD-10-CM

## 2019-03-31 DIAGNOSIS — C73 Malignant neoplasm of thyroid gland: Secondary | ICD-10-CM

## 2019-03-31 NOTE — Telephone Encounter (Signed)
Patient left a VM something about a whole body scan? She wants to know when that is suppose to be and where?

## 2019-03-31 NOTE — Telephone Encounter (Signed)
Left pt VM that the order had already been sent to [facility]. They should be calling her with appt soon.

## 2019-04-23 ENCOUNTER — Other Ambulatory Visit: Payer: Self-pay

## 2019-04-27 ENCOUNTER — Other Ambulatory Visit: Payer: Self-pay | Admitting: Physician Assistant

## 2019-04-28 ENCOUNTER — Other Ambulatory Visit: Payer: Self-pay

## 2019-04-28 ENCOUNTER — Ambulatory Visit (HOSPITAL_COMMUNITY): Payer: Medicare PPO

## 2019-04-28 ENCOUNTER — Ambulatory Visit (HOSPITAL_COMMUNITY)
Admission: RE | Admit: 2019-04-28 | Discharge: 2019-04-28 | Disposition: A | Discharge status: Home / Self Care | Payer: Medicare PPO | Source: Ambulatory Visit | Attending: "Endocrinology | Admitting: "Endocrinology

## 2019-04-28 DIAGNOSIS — E890 Postprocedural hypothyroidism: Secondary | ICD-10-CM | POA: Diagnosis present

## 2019-04-28 DIAGNOSIS — C73 Malignant neoplasm of thyroid gland: Secondary | ICD-10-CM | POA: Insufficient documentation

## 2019-04-29 NOTE — Progress Notes (Addendum)
[place], [place] - [id number] [place] [address] Phone: [phone] Fax: [facility] - [place], [place] [address] Phone: [phone] Fax: [phone]      Your procedure is scheduled on September 22nd.  Report to [facility]tr[facility] "A" at 12:30 P.M., and check in at the Admitting office.  Call this number if you have problems the morning of surgery:  [phone]  Call [phone] if you have any questions prior to your surgery date Monday-Friday 8am-4pm    Remember:  Do not eat after midnight the night before your surgery  You may drink clear liquids until 11:30 AM the morning of your surgery.   Clear liquids allowed are: Water, Non-Citrus Juices (without pulp), Carbonated Beverages, Clear Tea, Black Coffee Only, and Gatorade  Please complete your PRE-SURGERY G2 Gatorade that was provided to you by 11:30 AM the morning of surgery.  Please, if able, drink it in one setting. DO NOT SIP.     Take these medicines the morning of surgery with A SIP OF WATER   Tylenol - if needed  Levothyroxine (Synthroid)  Omeprazole (Prilosec)  Follow your surgeon's instructions on when to stop Aspirin.  If no instructions were given by your surgeon then you will need to call the office to get those instructions.    7 days prior to surgery STOP taking any Aleve, Naproxen, Ibuprofen, Motrin, Advil, Goody's, BC's, all herbal medications, fish oil, and all vitamins.   WHAT DO I DO ABOUT MY DIABETES MEDICATION?   Marland Kitchen Do not take oral diabetes medicines (pills) the morning of surgery. - Metformin   How to Manage Your Diabetes Before and After Surgery  Why is it important to control my blood sugar before and after surgery? . Improving blood sugar levels before and after surgery helps healing and can limit problems. . A way of improving blood sugar  control is eating a healthy diet by: o  Eating less sugar and carbohydrates o  Increasing activity/exercise o  Talking with your doctor about reaching your blood sugar goals . High blood sugars (greater than 180 mg/dL) can raise your risk of infections and slow your recovery, so you will need to focus on controlling your diabetes during the weeks before surgery. . Make sure that the doctor who takes care of your diabetes knows about your planned surgery including the date and location.  How do I manage my blood sugar before surgery? . Check your blood sugar at least 4 times a day, starting 2 days before surgery, to make sure that the level is not too high or low. o Check your blood sugar the morning of your surgery when you wake up and every 2 hours until you get to the Short Stay unit. . If your blood sugar is less than 70 mg/dL, you will need to treat for low blood sugar: o Do not take insulin. o Treat a low blood sugar (less than 70 mg/dL) with  cup of clear juice (cranberry or apple), 4 glucose tablets, OR glucose gel. o Recheck blood sugar in 15 minutes after treatment (to make sure it is greater than 70 mg/dL). If your blood sugar is not greater than 70 mg/dL on recheck, call [phone] for further instructions. . Report your blood sugar to the short stay nurse when you get to Short Stay.  . If you are admitted  to the hospital after surgery: o Your blood sugar will be checked by the staff and you will probably be given insulin after surgery (instead of oral diabetes medicines) to make sure you have good blood sugar levels. o The goal for blood sugar control after surgery is 80-180 mg/dL.   The Morning of Surgery  Do not wear jewelry, make-up or nail polish.  Do not wear lotions, powders, or perfumes, or deodorant  Do not shave 48 hours prior to surgery.    Do not bring valuables to the hospital.  [facility] is not responsible for any belongings or valuables.  If you are a  smoker, DO NOT Smoke 24 hours prior to surgery IF you wear a CPAP at night please bring your mask, tubing, and machine the morning of surgery   Remember that you must have someone to transport you home after your surgery, and remain with you for 24 hours if you are discharged the same day.   Contacts, glasses, hearing aids, dentures or bridgework may not be worn into surgery.    Leave your suitcase in the car.  After surgery it may be brought to your room.  For patients admitted to the hospital, discharge time will be determined by your treatment team.  Patients discharged the day of surgery will not be allowed to drive home.    Special instructions:   [facility]- Preparing For Surgery  Before surgery, you can play an important role. Because skin is not sterile, your skin needs to be as free of germs as possible. You can reduce the number of germs on your skin by washing with CHG (chlorahexidine gluconate) Soap before surgery.  CHG is an antiseptic cleaner which kills germs and bonds with the skin to continue killing germs even after washing.    Oral Hygiene is also important to reduce your risk of infection.  Remember - BRUSH YOUR TEETH THE MORNING OF SURGERY WITH YOUR REGULAR TOOTHPASTE  Please do not use if you have an allergy to CHG or antibacterial soaps. If your skin becomes reddened/irritated stop using the CHG.  Do not shave (including legs and underarms) for at least 48 hours prior to first CHG shower. It is OK to shave your face.  Please follow these instructions carefully.   1. Shower the NIGHT BEFORE SURGERY and the MORNING OF SURGERY with CHG Soap.   2. If you chose to wash your hair, wash your hair first as usual with your normal shampoo.  3. After you shampoo, rinse your hair and body thoroughly to remove the shampoo.  4. Use CHG as you would any other liquid soap. You can apply CHG directly to the skin and wash gently with a scrungie or a clean washcloth.    5. Apply the CHG Soap to your body ONLY FROM THE NECK DOWN.  Do not use on open wounds or open sores. Avoid contact with your eyes, ears, mouth and genitals (private parts). Wash Face and genitals (private parts)  with your normal soap.   6. Wash thoroughly, paying special attention to the area where your surgery will be performed.  7. Thoroughly rinse your body with warm water from the neck down.  8. DO NOT shower/wash with your normal soap after using and rinsing off the CHG Soap.  9. Pat yourself dry with a CLEAN TOWEL.  10. Wear CLEAN PAJAMAS to bed the night before surgery, wear comfortable clothes the morning of surgery  11. Place CLEAN SHEETS on your  bed the night of your first shower and DO NOT SLEEP WITH PETS.    Day of Surgery:  Do not apply any deodorants/lotions. Please shower the morning of surgery with the CHG soap  Please wear clean clothes to the hospital/surgery center.   Remember to brush your teeth WITH YOUR REGULAR TOOTHPASTE.   Please read over the following fact sheets that you were given.

## 2019-04-30 ENCOUNTER — Encounter (HOSPITAL_COMMUNITY)
Admission: RE | Admit: 2019-04-30 | Discharge: 2019-04-30 | Disposition: A | Discharge status: Home / Self Care | Payer: Medicare PPO | Source: Ambulatory Visit | Attending: Orthopaedic Surgery | Admitting: Orthopaedic Surgery

## 2019-04-30 ENCOUNTER — Other Ambulatory Visit (HOSPITAL_COMMUNITY)
Admission: RE | Admit: 2019-04-30 | Discharge: 2019-04-30 | Disposition: A | Discharge status: Home / Self Care | Payer: Medicare PPO | Source: Ambulatory Visit

## 2019-04-30 ENCOUNTER — Encounter (HOSPITAL_COMMUNITY): Payer: Self-pay

## 2019-04-30 ENCOUNTER — Other Ambulatory Visit: Payer: Self-pay

## 2019-04-30 DIAGNOSIS — Z01812 Encounter for preprocedural laboratory examination: Secondary | ICD-10-CM | POA: Insufficient documentation

## 2019-04-30 LAB — BASIC METABOLIC PANEL
Anion gap: 12 (ref 5–15)
BUN: 9 mg/dL (ref 8–23)
CO2: 21 mmol/L — ABNORMAL LOW (ref 22–32)
Calcium: 9.1 mg/dL (ref 8.9–10.3)
Chloride: 106 mmol/L (ref 98–111)
Creatinine, Ser: 0.8 mg/dL (ref 0.44–1.00)
GFR calc Af Amer: >60 mL/min (ref >60)
GFR calc non Af Amer: >60 mL/min (ref >60)
Glucose, Bld: 91 mg/dL (ref 70–99)
Potassium: 3.4 mmol/L — ABNORMAL LOW (ref 3.5–5.1)
Sodium: 139 mmol/L (ref 135–145)

## 2019-04-30 LAB — CBC
HCT: 47.9 % — ABNORMAL HIGH (ref 36.0–46.0)
Hemoglobin: 15.6 g/dL — ABNORMAL HIGH (ref 12.0–15.0)
MCH: 31.8 pg (ref 26.0–34.0)
MCHC: 32.6 g/dL (ref 30.0–36.0)
MCV: 97.8 fL (ref 80.0–100.0)
Platelets: 287 10*3/uL (ref 150–400)
RBC: 4.9 MIL/uL (ref 3.87–5.11)
RDW: 12.9 % (ref 11.5–15.5)
WBC: 8.1 10*3/uL (ref 4.0–10.5)
nRBC: 0 % (ref 0.0–0.2)

## 2019-04-30 LAB — SURGICAL PCR SCREEN
MRSA, PCR: NEGATIVE
Staphylococcus aureus: NEGATIVE

## 2019-04-30 LAB — HEMOGLOBIN A1C
Hgb A1c MFr Bld: 5.9 % — ABNORMAL HIGH (ref 4.8–5.6)
Mean Plasma Glucose: 122.63 mg/dL

## 2019-04-30 LAB — GLUCOSE, CAPILLARY: Glucose-Capillary: 92 mg/dL (ref 70–99)

## 2019-04-30 NOTE — Progress Notes (Signed)
PCP - Abran Richard Cardiologist - denies Endocrinologist - Dr. Dorris Fetch [facility])  Chest x-ray - n/a EKG - 07-15-18  DM - yes, Type 2 Fasting Blood Sugar - low 110  Aspirin Instructions: stop 7 days prior to surgery  ERAS Protcol - yes PRE-SURGERY G2 - pt given G2 gatorade per order   COVID TEST- 04-30-19   Anesthesia review: n/a  Patient denies shortness of breath, fever, cough and chest pain at PAT appointment   Patient verbalized understanding of instructions that were given to them at the PAT appointment. Patient was also instructed that they will need to review over the PAT instructions again at home before surgery.

## 2019-05-01 LAB — NOVEL CORONAVIRUS, NAA (HOSP ORDER, SEND-OUT TO REF LAB; TAT 18-24 HRS): SARS-CoV-2, NAA: NOT DETECTED

## 2019-05-03 LAB — T4, FREE: Free T4: 1.5 ng/dL (ref 0.8–1.8)

## 2019-05-03 LAB — TSH: TSH: 0.69 mIU/L (ref 0.40–4.50)

## 2019-05-03 LAB — THYROGLOBULIN ANTIBODY: Thyroglobulin Ab: <1 IU/mL (ref <=1)

## 2019-05-03 LAB — THYROGLOBULIN LEVEL: Thyroglobulin: 0.1 ng/mL — ABNORMAL LOW

## 2019-05-04 ENCOUNTER — Ambulatory Visit (HOSPITAL_COMMUNITY): Payer: Medicare PPO

## 2019-05-04 ENCOUNTER — Ambulatory Visit (HOSPITAL_COMMUNITY): Payer: Medicare PPO | Admitting: Certified Registered"

## 2019-05-04 ENCOUNTER — Inpatient Hospital Stay (HOSPITAL_COMMUNITY): Payer: Medicare PPO

## 2019-05-04 ENCOUNTER — Encounter (HOSPITAL_COMMUNITY): Payer: Self-pay

## 2019-05-04 ENCOUNTER — Encounter (HOSPITAL_COMMUNITY)
Admission: RE | Disposition: A | Discharge status: Home Health | Payer: Self-pay | Source: Home / Self Care | Attending: Orthopaedic Surgery

## 2019-05-04 ENCOUNTER — Other Ambulatory Visit: Payer: Self-pay

## 2019-05-04 ENCOUNTER — Inpatient Hospital Stay (HOSPITAL_COMMUNITY)
Admission: RE | Admit: 2019-05-04 | Discharge: 2019-05-07 | DRG: 470 | Disposition: A | Discharge status: Home Health | Payer: Medicare PPO | Attending: Orthopaedic Surgery | Admitting: Orthopaedic Surgery

## 2019-05-04 DIAGNOSIS — E7800 Pure hypercholesterolemia, unspecified: Secondary | ICD-10-CM | POA: Diagnosis present

## 2019-05-04 DIAGNOSIS — Z96641 Presence of right artificial hip joint: Secondary | ICD-10-CM

## 2019-05-04 DIAGNOSIS — E876 Hypokalemia: Secondary | ICD-10-CM | POA: Diagnosis not present

## 2019-05-04 DIAGNOSIS — Z79899 Other long term (current) drug therapy: Secondary | ICD-10-CM

## 2019-05-04 DIAGNOSIS — Z7982 Long term (current) use of aspirin: Secondary | ICD-10-CM | POA: Diagnosis not present

## 2019-05-04 DIAGNOSIS — Z7984 Long term (current) use of oral hypoglycemic drugs: Secondary | ICD-10-CM

## 2019-05-04 DIAGNOSIS — I10 Essential (primary) hypertension: Secondary | ICD-10-CM | POA: Diagnosis present

## 2019-05-04 DIAGNOSIS — Z20828 Contact with and (suspected) exposure to other viral communicable diseases: Secondary | ICD-10-CM | POA: Diagnosis present

## 2019-05-04 DIAGNOSIS — Z23 Encounter for immunization: Secondary | ICD-10-CM | POA: Diagnosis present

## 2019-05-04 DIAGNOSIS — M1611 Unilateral primary osteoarthritis, right hip: Secondary | ICD-10-CM | POA: Diagnosis present

## 2019-05-04 DIAGNOSIS — E785 Hyperlipidemia, unspecified: Secondary | ICD-10-CM | POA: Diagnosis present

## 2019-05-04 DIAGNOSIS — E119 Type 2 diabetes mellitus without complications: Secondary | ICD-10-CM | POA: Diagnosis present

## 2019-05-04 DIAGNOSIS — Z87891 Personal history of nicotine dependence: Secondary | ICD-10-CM

## 2019-05-04 DIAGNOSIS — Z96642 Presence of left artificial hip joint: Secondary | ICD-10-CM | POA: Diagnosis present

## 2019-05-04 DIAGNOSIS — K219 Gastro-esophageal reflux disease without esophagitis: Secondary | ICD-10-CM | POA: Diagnosis present

## 2019-05-04 DIAGNOSIS — Z79890 Hormone replacement therapy (postmenopausal): Secondary | ICD-10-CM | POA: Diagnosis not present

## 2019-05-04 DIAGNOSIS — Z791 Long term (current) use of non-steroidal anti-inflammatories (NSAID): Secondary | ICD-10-CM | POA: Diagnosis not present

## 2019-05-04 DIAGNOSIS — Z419 Encounter for procedure for purposes other than remedying health state, unspecified: Secondary | ICD-10-CM

## 2019-05-04 DIAGNOSIS — E890 Postprocedural hypothyroidism: Secondary | ICD-10-CM | POA: Diagnosis present

## 2019-05-04 DIAGNOSIS — Z85850 Personal history of malignant neoplasm of thyroid: Secondary | ICD-10-CM | POA: Diagnosis not present

## 2019-05-04 DIAGNOSIS — K5900 Constipation, unspecified: Secondary | ICD-10-CM | POA: Diagnosis not present

## 2019-05-04 DIAGNOSIS — M25551 Pain in right hip: Secondary | ICD-10-CM | POA: Diagnosis present

## 2019-05-04 HISTORY — PX: TOTAL HIP ARTHROPLASTY: SHX124

## 2019-05-04 LAB — GLUCOSE, CAPILLARY
Glucose-Capillary: 99 mg/dL (ref 70–99)
Glucose-Capillary: 105 mg/dL — ABNORMAL HIGH (ref 70–99)
Glucose-Capillary: 177 mg/dL — ABNORMAL HIGH (ref 70–99)

## 2019-05-04 SURGERY — ARTHROPLASTY, HIP, TOTAL, ANTERIOR APPROACH
Anesthesia: General | Site: Hip | Laterality: Right

## 2019-05-04 MED ORDER — ACETAMINOPHEN 10 MG/ML IV SOLN
1000.0000 mg | Freq: Once | INTRAVENOUS | Status: DC | PRN
  Administered 2019-05-04: 1000 mg via INTRAVENOUS

## 2019-05-04 MED ORDER — METHOCARBAMOL 500 MG PO TABS
500.0000 mg | ORAL_TABLET | Freq: Four times a day (QID) | ORAL | Status: DC | PRN
  Administered 2019-05-05 – 2019-05-07 (×4): 500 mg via ORAL
  Filled 2019-05-04 (×4): qty 1

## 2019-05-04 MED ORDER — TRANEXAMIC ACID-NACL 1000-0.7 MG/100ML-% IV SOLN
INTRAVENOUS | Status: AC
  Filled 2019-05-04: qty 100

## 2019-05-04 MED ORDER — OXYCODONE HCL 5 MG PO TABS
5.0000 mg | ORAL_TABLET | ORAL | Status: DC | PRN
  Administered 2019-05-04 – 2019-05-07 (×5): 10 mg via ORAL
  Filled 2019-05-04 (×7): qty 2

## 2019-05-04 MED ORDER — LIDOCAINE 2% (20 MG/ML) 5 ML SYRINGE
INTRAMUSCULAR | Status: DC | PRN
  Administered 2019-05-04: 60 mg via INTRAVENOUS

## 2019-05-04 MED ORDER — OXYCODONE HCL 5 MG PO TABS: 5.0000 mg | ORAL_TABLET | Freq: Once | ORAL | Status: DC | PRN

## 2019-05-04 MED ORDER — PHENOL 1.4 % MT LIQD: 1.0000 | OROMUCOSAL | Status: DC | PRN

## 2019-05-04 MED ORDER — ONDANSETRON HCL 4 MG PO TABS
4.0000 mg | ORAL_TABLET | Freq: Four times a day (QID) | ORAL | Status: DC | PRN
  Administered 2019-05-06: 4 mg via ORAL
  Filled 2019-05-04: qty 1

## 2019-05-04 MED ORDER — METOCLOPRAMIDE HCL 5 MG PO TABS: 5.0000 mg | ORAL_TABLET | Freq: Three times a day (TID) | ORAL | Status: DC | PRN

## 2019-05-04 MED ORDER — ONDANSETRON HCL 4 MG/2ML IJ SOLN
INTRAMUSCULAR | Status: DC | PRN
  Administered 2019-05-04: 4 mg via INTRAVENOUS

## 2019-05-04 MED ORDER — TRANEXAMIC ACID-NACL 1000-0.7 MG/100ML-% IV SOLN
1000.0000 mg | INTRAVENOUS | Status: AC
  Administered 2019-05-04: 1000 mg via INTRAVENOUS

## 2019-05-04 MED ORDER — OXYCODONE HCL 5 MG/5ML PO SOLN: 5.0000 mg | Freq: Once | ORAL | Status: DC | PRN

## 2019-05-04 MED ORDER — HYDROMORPHONE HCL 1 MG/ML IJ SOLN: 0.5000 mg | INTRAMUSCULAR | Status: DC | PRN

## 2019-05-04 MED ORDER — METHOCARBAMOL 1000 MG/10ML IJ SOLN
500.0000 mg | Freq: Four times a day (QID) | INTRAVENOUS | Status: DC | PRN
  Filled 2019-05-04: qty 5

## 2019-05-04 MED ORDER — ALUM & MAG HYDROXIDE-SIMETH 200-200-20 MG/5ML PO SUSP: 30.0000 mL | ORAL | Status: DC | PRN

## 2019-05-04 MED ORDER — ONDANSETRON HCL 4 MG/2ML IJ SOLN
INTRAMUSCULAR | Status: AC
  Filled 2019-05-04: qty 2

## 2019-05-04 MED ORDER — FENTANYL CITRATE (PF) 250 MCG/5ML IJ SOLN
INTRAMUSCULAR | Status: AC
  Filled 2019-05-04: qty 5

## 2019-05-04 MED ORDER — LOSARTAN POTASSIUM 50 MG PO TABS
50.0000 mg | ORAL_TABLET | Freq: Every day | ORAL | Status: DC
  Administered 2019-05-04 – 2019-05-07 (×4): 50 mg via ORAL
  Filled 2019-05-04 (×4): qty 1

## 2019-05-04 MED ORDER — SODIUM CHLORIDE 0.9 % IR SOLN
Status: DC | PRN
  Administered 2019-05-04: 3000 mL

## 2019-05-04 MED ORDER — 0.9 % SODIUM CHLORIDE (POUR BTL) OPTIME
TOPICAL | Status: DC | PRN
  Administered 2019-05-04: 17:00:00 1000 mL

## 2019-05-04 MED ORDER — CEFAZOLIN SODIUM-DEXTROSE 2-4 GM/100ML-% IV SOLN
2.0000 g | INTRAVENOUS | Status: AC
  Administered 2019-05-04: 2 g via INTRAVENOUS

## 2019-05-04 MED ORDER — LABETALOL HCL 5 MG/ML IV SOLN
10.0000 mg | Freq: Once | INTRAVENOUS | Status: AC
  Administered 2019-05-04: 10 mg via INTRAVENOUS

## 2019-05-04 MED ORDER — LEVOTHYROXINE SODIUM 112 MCG PO TABS
112.0000 ug | ORAL_TABLET | Freq: Every day | ORAL | Status: DC
  Administered 2019-05-05 – 2019-05-07 (×3): 112 ug via ORAL
  Filled 2019-05-04 (×3): qty 1

## 2019-05-04 MED ORDER — METFORMIN HCL 500 MG PO TABS
500.0000 mg | ORAL_TABLET | Freq: Every day | ORAL | Status: DC
  Administered 2019-05-05 – 2019-05-07 (×3): 500 mg via ORAL
  Filled 2019-05-04 (×3): qty 1

## 2019-05-04 MED ORDER — METOPROLOL TARTRATE 5 MG/5ML IV SOLN
INTRAVENOUS | Status: DC | PRN
  Administered 2019-05-04 (×2): 2.5 mg via INTRAVENOUS

## 2019-05-04 MED ORDER — DIPHENHYDRAMINE HCL 12.5 MG/5ML PO ELIX
12.5000 mg | ORAL_SOLUTION | ORAL | Status: DC | PRN
  Administered 2019-05-06: 12.5 mg via ORAL
  Filled 2019-05-04: qty 10

## 2019-05-04 MED ORDER — METOPROLOL TARTRATE 5 MG/5ML IV SOLN
INTRAVENOUS | Status: AC
  Filled 2019-05-04: qty 5

## 2019-05-04 MED ORDER — ACETAMINOPHEN 325 MG PO TABS
325.0000 mg | ORAL_TABLET | Freq: Four times a day (QID) | ORAL | Status: DC | PRN
  Filled 2019-05-04: qty 2

## 2019-05-04 MED ORDER — ROCURONIUM BROMIDE 10 MG/ML (PF) SYRINGE
PREFILLED_SYRINGE | INTRAVENOUS | Status: AC
  Filled 2019-05-04: qty 10

## 2019-05-04 MED ORDER — ROCURONIUM BROMIDE 10 MG/ML (PF) SYRINGE
PREFILLED_SYRINGE | INTRAVENOUS | Status: DC | PRN
  Administered 2019-05-04: 60 mg via INTRAVENOUS

## 2019-05-04 MED ORDER — HYDRALAZINE HCL 20 MG/ML IJ SOLN
INTRAMUSCULAR | Status: DC | PRN
  Administered 2019-05-04: 5 mg via INTRAVENOUS

## 2019-05-04 MED ORDER — SUGAMMADEX SODIUM 200 MG/2ML IV SOLN
INTRAVENOUS | Status: DC | PRN
  Administered 2019-05-04: 200 mg via INTRAVENOUS

## 2019-05-04 MED ORDER — POVIDONE-IODINE 10 % EX SWAB
2.0000 "application " | Freq: Once | CUTANEOUS | Status: AC
  Administered 2019-05-04: 2 via TOPICAL

## 2019-05-04 MED ORDER — MIDAZOLAM HCL 2 MG/2ML IJ SOLN
INTRAMUSCULAR | Status: AC
  Filled 2019-05-04: qty 2

## 2019-05-04 MED ORDER — LIDOCAINE 2% (20 MG/ML) 5 ML SYRINGE
INTRAMUSCULAR | Status: AC
  Filled 2019-05-04: qty 5

## 2019-05-04 MED ORDER — LABETALOL HCL 5 MG/ML IV SOLN
10.0000 mg | Freq: Once | INTRAVENOUS | Status: AC
  Administered 2019-05-04: 14:00:00 10 mg via INTRAVENOUS

## 2019-05-04 MED ORDER — DOCUSATE SODIUM 100 MG PO CAPS
100.0000 mg | ORAL_CAPSULE | Freq: Two times a day (BID) | ORAL | Status: DC
  Administered 2019-05-04 – 2019-05-07 (×6): 100 mg via ORAL
  Filled 2019-05-04 (×6): qty 1

## 2019-05-04 MED ORDER — ASPIRIN 81 MG PO CHEW
81.0000 mg | CHEWABLE_TABLET | Freq: Two times a day (BID) | ORAL | Status: DC
  Administered 2019-05-04 – 2019-05-07 (×6): 81 mg via ORAL
  Filled 2019-05-04 (×6): qty 1

## 2019-05-04 MED ORDER — MENTHOL 3 MG MT LOZG: 1.0000 | LOZENGE | OROMUCOSAL | Status: DC | PRN

## 2019-05-04 MED ORDER — DEXAMETHASONE SODIUM PHOSPHATE 10 MG/ML IJ SOLN
INTRAMUSCULAR | Status: DC | PRN
  Administered 2019-05-04: 4 mg via INTRAVENOUS

## 2019-05-04 MED ORDER — POLYETHYLENE GLYCOL 3350 17 G PO PACK
17.0000 g | PACK | Freq: Every day | ORAL | Status: DC | PRN
  Administered 2019-05-05: 17 g via ORAL
  Filled 2019-05-04: qty 1

## 2019-05-04 MED ORDER — FENTANYL CITRATE (PF) 100 MCG/2ML IJ SOLN
25.0000 ug | INTRAMUSCULAR | Status: DC | PRN
  Administered 2019-05-04 (×2): 50 ug via INTRAVENOUS

## 2019-05-04 MED ORDER — CEFAZOLIN SODIUM-DEXTROSE 1-4 GM/50ML-% IV SOLN
1.0000 g | Freq: Four times a day (QID) | INTRAVENOUS | Status: AC
  Administered 2019-05-04 – 2019-05-05 (×2): 1 g via INTRAVENOUS
  Filled 2019-05-04 (×2): qty 50

## 2019-05-04 MED ORDER — METOCLOPRAMIDE HCL 5 MG/ML IJ SOLN: 5.0000 mg | Freq: Three times a day (TID) | INTRAMUSCULAR | Status: DC | PRN

## 2019-05-04 MED ORDER — ACETAMINOPHEN 10 MG/ML IV SOLN
INTRAVENOUS | Status: AC
  Filled 2019-05-04: qty 100

## 2019-05-04 MED ORDER — PROMETHAZINE HCL 25 MG/ML IJ SOLN: 6.2500 mg | INTRAMUSCULAR | Status: DC | PRN

## 2019-05-04 MED ORDER — LABETALOL HCL 5 MG/ML IV SOLN
INTRAVENOUS | Status: AC
  Administered 2019-05-04: 10 mg via INTRAVENOUS
  Filled 2019-05-04: qty 4

## 2019-05-04 MED ORDER — CEFAZOLIN SODIUM-DEXTROSE 2-4 GM/100ML-% IV SOLN
INTRAVENOUS | Status: AC
  Filled 2019-05-04: qty 100

## 2019-05-04 MED ORDER — FENTANYL CITRATE (PF) 100 MCG/2ML IJ SOLN
INTRAMUSCULAR | Status: DC | PRN
  Administered 2019-05-04: 100 ug via INTRAVENOUS
  Administered 2019-05-04: 50 ug via INTRAVENOUS
  Administered 2019-05-04 (×2): 100 ug via INTRAVENOUS

## 2019-05-04 MED ORDER — LACTATED RINGERS IV SOLN
INTRAVENOUS | Status: DC
  Administered 2019-05-04: 13:00:00 via INTRAVENOUS

## 2019-05-04 MED ORDER — SODIUM CHLORIDE 0.9 % IV SOLN
INTRAVENOUS | Status: DC
  Administered 2019-05-04: 21:00:00 via INTRAVENOUS

## 2019-05-04 MED ORDER — PANTOPRAZOLE SODIUM 40 MG PO TBEC
40.0000 mg | DELAYED_RELEASE_TABLET | Freq: Every day | ORAL | Status: DC
  Administered 2019-05-05 – 2019-05-07 (×3): 40 mg via ORAL
  Filled 2019-05-04 (×3): qty 1

## 2019-05-04 MED ORDER — FENTANYL CITRATE (PF) 100 MCG/2ML IJ SOLN
INTRAMUSCULAR | Status: AC
  Filled 2019-05-04: qty 2

## 2019-05-04 MED ORDER — ONDANSETRON HCL 4 MG/2ML IJ SOLN
4.0000 mg | Freq: Four times a day (QID) | INTRAMUSCULAR | Status: DC | PRN
  Administered 2019-05-05: 4 mg via INTRAVENOUS

## 2019-05-04 MED ORDER — PROPOFOL 10 MG/ML IV BOLUS
INTRAVENOUS | Status: DC | PRN
  Administered 2019-05-04: 90 mg via INTRAVENOUS

## 2019-05-04 MED ORDER — DEXAMETHASONE SODIUM PHOSPHATE 10 MG/ML IJ SOLN
INTRAMUSCULAR | Status: AC
  Filled 2019-05-04: qty 1

## 2019-05-04 MED ORDER — OXYCODONE HCL 5 MG PO TABS
10.0000 mg | ORAL_TABLET | ORAL | Status: DC | PRN
  Administered 2019-05-05: 15 mg via ORAL
  Administered 2019-05-05 – 2019-05-06 (×2): 10 mg via ORAL
  Filled 2019-05-04: qty 3

## 2019-05-04 MED ORDER — CHLORHEXIDINE GLUCONATE 4 % EX LIQD: 60.0000 mL | Freq: Once | CUTANEOUS | Status: DC

## 2019-05-04 MED ORDER — MIDAZOLAM HCL 5 MG/5ML IJ SOLN
INTRAMUSCULAR | Status: DC | PRN
  Administered 2019-05-04: 2 mg via INTRAVENOUS

## 2019-05-04 SURGICAL SUPPLY — 55 items
BENZOIN TINCTURE PRP APPL 2/3 (GAUZE/BANDAGES/DRESSINGS) ×3 IMPLANT
BLADE SAW SGTL 18X1.27X75 (BLADE) ×2 IMPLANT
BLADE SAW SGTL 18X1.27X75MM (BLADE) ×1
CLOSURE WOUND 1/2 X4 (GAUZE/BANDAGES/DRESSINGS) ×2
COVER SURGICAL LIGHT HANDLE (MISCELLANEOUS) ×3 IMPLANT
COVER WAND RF STERILE (DRAPES) ×3 IMPLANT
CUP SECTOR GRIPTON 50MM (Cup) ×3 IMPLANT
DRAPE C-ARM 42X72 X-RAY (DRAPES) ×3 IMPLANT
DRAPE STERI IOBAN 125X83 (DRAPES) ×3 IMPLANT
DRAPE U-SHAPE 47X51 STRL (DRAPES) ×9 IMPLANT
DRSG AQUACEL AG ADV 3.5X10 (GAUZE/BANDAGES/DRESSINGS) ×3 IMPLANT
DRSG XEROFORM 1X8 (GAUZE/BANDAGES/DRESSINGS) ×3 IMPLANT
DURAPREP 26ML APPLICATOR (WOUND CARE) ×3 IMPLANT
ELECT BLADE 4.0 EZ CLEAN MEGAD (MISCELLANEOUS) ×3
ELECT BLADE 6.5 EXT (BLADE) ×3 IMPLANT
ELECT REM PT RETURN 9FT ADLT (ELECTROSURGICAL) ×3
ELECTRODE BLDE 4.0 EZ CLN MEGD (MISCELLANEOUS) ×1 IMPLANT
ELECTRODE REM PT RTRN 9FT ADLT (ELECTROSURGICAL) ×1 IMPLANT
FACESHIELD WRAPAROUND (MASK) ×9 IMPLANT
GLOVE BIOGEL PI IND STRL 8 (GLOVE) ×2 IMPLANT
GLOVE BIOGEL PI INDICATOR 8 (GLOVE) ×4
GLOVE ECLIPSE 8.0 STRL XLNG CF (GLOVE) ×3 IMPLANT
GLOVE ORTHO TXT STRL SZ7.5 (GLOVE) ×6 IMPLANT
GOWN STRL REUS W/ TWL LRG LVL3 (GOWN DISPOSABLE) ×2 IMPLANT
GOWN STRL REUS W/ TWL XL LVL3 (GOWN DISPOSABLE) ×2 IMPLANT
GOWN STRL REUS W/TWL LRG LVL3 (GOWN DISPOSABLE) ×6
GOWN STRL REUS W/TWL XL LVL3 (GOWN DISPOSABLE) ×6
HANDPIECE INTERPULSE COAX TIP (DISPOSABLE) ×3
HEAD FEM STD 32X+1 STRL (Hips) ×3 IMPLANT
KIT BASIN OR (CUSTOM PROCEDURE TRAY) ×3 IMPLANT
KIT TURNOVER KIT B (KITS) ×3 IMPLANT
LINER ACETABULAR 32X50 (Liner) ×3 IMPLANT
MANIFOLD NEPTUNE II (INSTRUMENTS) ×3 IMPLANT
NS IRRIG 1000ML POUR BTL (IV SOLUTION) ×3 IMPLANT
PACK TOTAL JOINT (CUSTOM PROCEDURE TRAY) ×3 IMPLANT
PAD ARMBOARD 7.5X6 YLW CONV (MISCELLANEOUS) ×3 IMPLANT
SCREW PINN CAN 6.5X20 (Screw) ×3 IMPLANT
SET HNDPC FAN SPRY TIP SCT (DISPOSABLE) ×1 IMPLANT
STAPLER VISISTAT 35W (STAPLE) IMPLANT
STEM CORAIL KA11 (Stem) ×3 IMPLANT
STRIP CLOSURE SKIN 1/2X4 (GAUZE/BANDAGES/DRESSINGS) ×4 IMPLANT
SUT ETHIBOND NAB CT1 #1 30IN (SUTURE) ×3 IMPLANT
SUT MNCRL AB 4-0 PS2 18 (SUTURE) IMPLANT
SUT VIC AB 0 CT1 27 (SUTURE) ×3
SUT VIC AB 0 CT1 27XBRD ANBCTR (SUTURE) ×1 IMPLANT
SUT VIC AB 1 CT1 27 (SUTURE) ×2
SUT VIC AB 1 CT1 27XBRD ANBCTR (SUTURE) ×1 IMPLANT
SUT VIC AB 2-0 CT1 27 (SUTURE) ×3
SUT VIC AB 2-0 CT1 TAPERPNT 27 (SUTURE) ×1 IMPLANT
TOWEL GREEN STERILE (TOWEL DISPOSABLE) ×3 IMPLANT
TOWEL GREEN STERILE FF (TOWEL DISPOSABLE) ×3 IMPLANT
TRAY CATH 16FR W/PLASTIC CATH (SET/KITS/TRAYS/PACK) IMPLANT
TRAY FOLEY W/BAG SLVR 16FR (SET/KITS/TRAYS/PACK)
TRAY FOLEY W/BAG SLVR 16FR ST (SET/KITS/TRAYS/PACK) IMPLANT
WATER STERILE IRR 1000ML POUR (IV SOLUTION) ×6 IMPLANT

## 2019-05-04 NOTE — Anesthesia Procedure Notes (Signed)
Procedure Name: Intubation Date/Time: 05/04/2019 4:34 PM Performed by: Moshe Salisbury, CRNA Pre-anesthesia Checklist: Patient identified, Emergency Drugs available, Suction available and Patient being monitored Patient Re-evaluated:Patient Re-evaluated prior to induction Oxygen Delivery Method: Circle System Utilized Preoxygenation: Pre-oxygenation with 100% oxygen Induction Type: IV induction Ventilation: Mask ventilation without difficulty Laryngoscope Size: Mac and 3 Grade View: Grade II Tube type: Oral Tube size: 7.5 mm Number of attempts: 1 Airway Equipment and Method: Stylet and Oral airway Placement Confirmation: ETT inserted through vocal cords under direct vision,  positive ETCO2 and breath sounds checked- equal and bilateral Secured at: 21 cm Tube secured with: Tape Dental Injury: Teeth and Oropharynx as per pre-operative assessment

## 2019-05-04 NOTE — Progress Notes (Signed)
Dr. Daiva Huge made aware of BP

## 2019-05-04 NOTE — Op Note (Signed)
NAME: SUSANAH, KRUGMAN MEDICAL RECORD [id number] ACCOUNT [id number] DATE OF BIRTH:28-Nov-1945 FACILITY: [facility] LOCATION: [facility] PHYSICIAN:Shandee Jergens Kerry Fort, MD  OPERATIVE REPORT  DATE OF PROCEDURE:  05/04/2019  PREOPERATIVE DIAGNOSIS:  Primary osteoarthritis and degenerative joint disease, right hip.  POSTOPERATIVE DIAGNOSIS:  Primary osteoarthritis and degenerative joint disease, right hip.  PROCEDURE:  Right total hip arthroplasty through direct anterior approach.  IMPLANTS:  DePuy Sector Gription acetabular component size 50, size 32+0 neutral polyethylene liner, size 11 Corail femoral component with standard offset, size 32+1 metal hip ball, 1 screw in the acetabulum.  SURGEON:  Lind Guest. Ninfa Linden, MD  ASSISTANT:  Erskine Emery, PA-C  ANESTHESIA:  General.  ANTIBIOTICS:  Two grams IV Ancef.  ESTIMATED BLOOD LOSS:  200 mL.  COMPLICATIONS:  None.  INDICATIONS:  The patient is a very pleasant 73 year old female with debilitating arthritis involving her right hip.  We actually replaced her left hip due to a similar situation in just December of last year.  Her right hip x-rays show complete loss of  joint space.  Her arthritis is severe.  At this point, her right hip pain is detrimentally affecting her mobility, her quality of life, and her activities of daily living to the point she does wish to proceed with a total hip arthroplasty on the right  side.  We had a long and thorough discussion about the risk of acute blood loss anemia, nerve and vessel injury, fracture, infection, dislocation, DVT and implant failure.  We talked about her goals being decreased pain, improved mobility, and overall  improved quality of life.  DESCRIPTION OF PROCEDURE:  After informed consent was obtained and appropriate right hip was marked, she was brought to the operating room and general anesthesia was obtained while she was on her stretcher.  Traction boots were placed on both her  feet.   Next, she was placed supine on the [place]ana fracture table, the perineal post in place, and both legs in in-line skeletal traction device and no traction applied.  Her right operative hip was prepped and draped with DuraPrep and sterile drapes.  A time-out  was called.  She was identified as correct patient, correct right hip.  We then made an incision just inferior and posterior to the anterior iliac spine and carried this obliquely down the leg.  We dissected down the tensor fascia lata muscle.  Tensor  fascia was then divided longitudinally to proceed with direct anterior approach to the hip.  We identified and cauterized circumflex vessels.  I then identified the hip capsule and opened up the hip capsule in an L-type format, finding moderate joint  effusion and significant osteophytes around the femoral head and neck.  We then made our femoral neck cut with an oscillating saw just proximal to the lesser trochanter and completed this with an osteotome.  Of note, we had cauterized again the  circumflex vessels, then placed our Cobra retractor around the medial and lateral femoral neck for making this neck cut.  We then placed a corkscrew guide in the femoral head and removed the femoral head in its entirety and found a wide area devoid of  cartilage.  We then placed a bent Hohmann over the medial acetabular rim and removed remnants of the acetabular labrum and other debris.  We then began reaming sequentially in stepwise increments from a size 42 reamer in stepwise increments going up to a  size 49 with all reamers under direct visualization, the last reamer under direct fluoroscopy so we  could obtain our depth of reaming, our inclination and anteversion.  We then placed the real DePuy Sector Gription acetabular component size 50 and a  single screw and a 32+0 neutral polyethylene liner for that size acetabular component.  Attention was then turned to the femur.  With the leg externally rotated to 120  degrees, extended and adducted, we were able to place a Mueller retractor medially and  a Hohmann retractor behind the greater trochanter.  We released the lateral joint capsule and used a box-cutting osteotome to enter the femoral canal and a rongeur to lateralize.  We then began broaching from a size 8 broach using the Corail broaching  system up to a size 11.  With the 11 in place, we trialed a standard offset femoral neck and a 32+1 hip ball.  We brought the leg back over and up and with traction and internal rotation reduced the pelvis.  We were pleased with the range of motion,  offset, leg length, and stability assessed radiographically and clinically.  We then dislocated the hip and removed the trial components.  We placed the real Corail femoral component with standard offset size 11 and the real 32+1 hip ball and again  reduced this in the acetabulum.  We were pleased with the leg length, offset, range of motion, and stability.  We then irrigated the soft tissue with normal saline solution using pulsatile lavage.  We closed the joint capsule with interrupted #1 Ethibond  suture, followed by closing the tensor fascia with #1 Vicryl.  We closed the deep tissue with 0 Vicryl.  The subcutaneous tissue was closed with 2-0 Vicryl, and staples were used to close the skin.  Xeroform and an Aquacel dressing were applied.  She  was taken off the Hana table, awakened, extubated, and taken to recovery room in stable condition.  All final counts were correct.  There were no complications noted.  Of note, Benita Stabile, PA-C, assisted the entire case.  His assistance was crucial for  facilitating all aspects of this case.  LN/NUANCE  D:05/04/2019 T:05/04/2019 JOB:[id number]

## 2019-05-04 NOTE — Brief Op Note (Signed)
05/04/2019  5:43 PM  PATIENT:  Kristina Huang  73 y.o. female  PRE-OPERATIVE DIAGNOSIS:  osteoarthritis right hip  POST-OPERATIVE DIAGNOSIS:  osteoarthritis right hip  PROCEDURE:  Procedure(s): RIGHT TOTAL HIP ARTHROPLASTY ANTERIOR APPROACH (Right)  SURGEON:  Surgeon(s) and Role:    Mcarthur Rossetti, MD - Primary  PHYSICIAN ASSISTANT: Benita Stabile, PA-C  ANESTHESIA:   general  EBL:  200 mL   COUNTS:  YES  DICTATION: .Other Dictation: Dictation Number [id number]  PLAN OF CARE: Admit to inpatient   PATIENT DISPOSITION:  PACU - hemodynamically stable.   Delay start of Pharmacological VTE agent (>24hrs) due to surgical blood loss or risk of bleeding: no

## 2019-05-04 NOTE — Anesthesia Preprocedure Evaluation (Addendum)
Anesthesia Evaluation  Patient identified by MRN, date of birth, ID band Patient awake    Reviewed: Allergy & Precautions, Patient's Chart, lab work & pertinent test results  History of Anesthesia Complications Negative for: history of anesthetic complications  Airway Mallampati: II  TM Distance: >3 FB Neck ROM: Full    Dental  (+) Missing,    Pulmonary Patient abstained from smoking., former smoker,    Pulmonary exam normal        Cardiovascular hypertension, Pt. on medications Normal cardiovascular exam     Neuro/Psych negative neurological ROS  negative psych ROS   GI/Hepatic Neg liver ROS, GERD  Controlled and Medicated,  Endo/Other  diabetes, Type 2, Oral Hypoglycemic AgentsHypothyroidism   Renal/GU negative Renal ROS  negative genitourinary   Musculoskeletal  (+) Arthritis ,   Abdominal   Peds  Hematology negative hematology ROS (+)   Anesthesia Other Findings Day of surgery medications reviewed with patient.  Reproductive/Obstetrics negative OB ROS                            Anesthesia Physical Anesthesia Plan  ASA: II  Anesthesia Plan: General   Post-op Pain Management:    Induction: Intravenous  PONV Risk Score and Plan: 4 or greater and Treatment may vary due to age or medical condition, Ondansetron, Dexamethasone and Midazolam  Airway Management Planned: Oral ETT  Additional Equipment: None  Intra-op Plan:   Post-operative Plan: Extubation in OR  Informed Consent: I have reviewed the patients History and Physical, chart, labs and discussed the procedure including the risks, benefits and alternatives for the proposed anesthesia with the patient or authorized representative who has indicated his/her understanding and acceptance.     Dental advisory given  Plan Discussed with: CRNA  Anesthesia Plan Comments:        Anesthesia Quick Evaluation

## 2019-05-04 NOTE — Transfer of Care (Signed)
Immediate Anesthesia Transfer of Care Note  Patient: Kristina Huang  Procedure(s) Performed: RIGHT TOTAL HIP ARTHROPLASTY ANTERIOR APPROACH (Right Hip)  Patient Location: PACU  Anesthesia Type:General  Level of Consciousness: drowsy and patient cooperative  Airway & Oxygen Therapy: Patient Spontanous Breathing and Patient connected to nasal cannula oxygen  Post-op Assessment: Report given to RN, Post -op Vital signs reviewed and stable and Patient moving all extremities  Post vital signs: Reviewed and stable  Last Vitals:  Vitals Value Taken Time  BP 137/76 05/04/19 1804  Temp    Pulse 83 05/04/19 1808  Resp 16 05/04/19 1808  SpO2 100 % 05/04/19 1808  Vitals shown include unvalidated device data.  Last Pain:  Vitals:   05/04/19 1314  TempSrc:   PainSc: 5          Complications: No apparent anesthesia complications

## 2019-05-04 NOTE — Progress Notes (Signed)
Elevated BP Dr. Daiva Huge notified, verbal orders given. No c/o dizziness, headaches, chest pain, or blurry vision. Patient resting comfortably. Will continue to monitor.

## 2019-05-04 NOTE — H&P (Signed)
TOTAL HIP ADMISSION H&P  Patient is admitted for right total hip arthroplasty.  Subjective:  Chief Complaint: right hip pain  HPI: Kristina Huang, 73 y.o. female, has a history of pain and functional disability in the right hip(s) due to arthritis and patient has failed non-surgical conservative treatments for greater than 12 weeks to include NSAID's and/or analgesics, corticosteriod injections, flexibility and strengthening excercises, supervised PT with diminished ADL's post treatment, use of assistive devices and activity modification.  Onset of symptoms was gradual starting 5 years ago with gradually worsening course since that time.The patient noted no past surgery on the right hip(s).  Patient currently rates pain in the right hip at 10 out of 10 with activity. Patient has night pain, worsening of pain with activity and weight bearing, trendelenberg gait, pain that interfers with activities of daily living, pain with passive range of motion and crepitus. Patient has evidence of subchondral cysts, subchondral sclerosis, periarticular osteophytes and joint space narrowing by imaging studies. This condition presents safety issues increasing the risk of falls.  There is no current active infection.  Patient Active Problem List   Diagnosis Date Noted  . Unilateral primary osteoarthritis, right hip 03/09/2019  . Vitamin D deficiency 08/13/2018  . Prediabetes 08/13/2018  . Unilateral primary osteoarthritis, left hip 07/21/2018  . Status post total replacement of left hip 07/21/2018  . Malignant neoplasm of thyroid gland ([place]) 03/18/2017  . 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 Multinodular goiter 12/10/2016  . Personal history of colonic polyps 08/23/2014  . GERD (gastroesophageal reflux disease) 08/23/2014  . Chronic diarrhea 08/23/2014  . Spondylolisthesis at L3-L4 level 11/17/2013  . COLONIC POLYPS, HYPERPLASTIC 02/03/2009  . HYPERLIPIDEMIA 02/03/2009  . HYPERTENSION 02/03/2009  .  GERD 02/03/2009  . ARTHRITIS 02/03/2009   Past Medical History:  Diagnosis Date  . Acid reflux   . Arthritis   . Cancer ([place])    Thyroid  . Diabetes mellitus without complication (HCC)    fasting 100-130  . Hypercholesterolemia   . Hypertension   . 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 BACK SURGERY    . BUNIONECTOMY Bilateral   . CHOLECYSTECTOMY     Dr. Irving Shows  . COLONOSCOPY  2009   Dr. Oneida Alar: moderate internal hemorrhoids, frequent sigmoid colon diverticula, polyps X 3, one large adenoma otherwise hyperplastic  . COLONOSCOPY N/A 09/09/2014   Procedure: COLONOSCOPY;  Surgeon: Danie Binder, MD;  [facility];  Service: Endoscopy;  Laterality: N/A;  200 - moved to 1/29 @ 12:45  . COLONOSCOPY N/A 10/24/2017   Procedure: COLONOSCOPY;  Surgeon: Danie Binder, MD;  [facility];  Service: Endoscopy;  Laterality: N/A;  9:30  . ESOPHAGOGASTRODUODENOSCOPY N/A 09/09/2014   Procedure: ESOPHAGOGASTRODUODENOSCOPY (EGD);  Surgeon: Danie Binder, MD;  [facility];  Service: Endoscopy;  Laterality: N/A;  . EYE SURGERY Right   . JOINT REPLACEMENT    . LAMINECTOMY  1997  . MAXIMUM ACCESS (MAS)POSTERIOR LUMBAR INTERBODY FUSION (PLIF) 2 LEVEL  11/2013   POSTERIOR LUMBAR INTERBODY FUSION POSTERIOR LATERAL ARTHRODESIS POSTERIOR NONSEGMENTAL INSTRUMENTATION  LUMBAR TWO-THREE ,LUMBAR THREE-FOUR WITH HARDWARE REMOVAL LUMBAR FOUR-FIVE/notes 11/17/2013  . THYROIDECTOMY N/A 02/05/2017   Procedure: THYROIDECTOMY;  Surgeon: Aviva Signs, MD;  Location: [facility];  Service: General;  Laterality: N/A;  . TOTAL HIP ARTHROPLASTY Left 07/21/2018  . TOTAL HIP ARTHROPLASTY Left 07/21/2018   Procedure: LEFT TOTAL  HIP ARTHROPLASTY ANTERIOR APPROACH;  Surgeon: Mcarthur Rossetti, MD;  Location: [facility];  Service: Orthopedics;  Laterality: Left;  . TUBAL LIGATION      No current facility-administered  medications for this encounter.    Current Outpatient Medications  Medication Sig Dispense Refill Last Dose  . acetaminophen (TYLENOL) 500 MG tablet Take 1,000 mg by mouth every 8 (eight) hours as needed for moderate pain or headache.      Marland Kitchen aspirin EC 81 MG tablet Take 81 mg by mouth daily.     . diclofenac (VOLTAREN) 75 MG EC tablet Take 75 mg by mouth 2 (two) times daily.      Marland Kitchen levothyroxine (SYNTHROID, LEVOTHROID) 112 MCG tablet Take 1 tablet (112 mcg total) by mouth daily before breakfast. 30 tablet 6   . losartan (COZAAR) 50 MG tablet Take 50 mg by mouth daily.      . metFORMIN (GLUCOPHAGE) 500 MG tablet Take 500 mg by mouth daily with breakfast.     . omeprazole (PRILOSEC) 20 MG capsule Take 20 mg by mouth daily.     Marland Kitchen ACCU-CHEK AVIVA PLUS test strip      . ACCU-CHEK SOFTCLIX LANCETS lancets       No Known Allergies  Social History   Tobacco Use  . Smoking status: Former Smoker    Packs/day: 0.25    Years: 2.00    Pack years: 0.50    Types: Cigarettes    Quit date: 01/31/2010    Years since quitting: 9.2  . Smokeless tobacco: Never Used  Substance Use Topics  . Alcohol use: No    Alcohol/week: 0.0 standard drinks    Family History  Problem Relation Age of Onset  . Heart disease Mother   . Heart disease Father   . Colon cancer Neg Hx      Review of Systems  Musculoskeletal: Positive for joint pain.  All other systems reviewed and are negative.   Objective:  Physical Exam  Constitutional: She is oriented to person, place, and time. She appears well-developed and well-nourished.  HENT:  Head: Normocephalic and atraumatic.  Eyes: Pupils are equal, round, and reactive to light. EOM are normal.  Neck: Normal range of motion. Neck supple.  Cardiovascular: Normal rate.  Respiratory: Effort normal.  GI: Soft.  Musculoskeletal:     Right hip: She exhibits decreased range of motion, decreased strength, tenderness and bony tenderness.  Neurological: She is alert and  oriented to person, place, and time.  Skin: Skin is warm and dry.  Psychiatric: She has a normal mood and affect.    Vital signs in last 24 hours:    Labs:   Estimated body mass index is 27.37 kg/m as calculated from the following:   Height as of 04/30/19: 5\' 3"  (1.6 m).   Weight as of 04/30/19: 70.1 kg.   Imaging Review Plain radiographs demonstrate severe degenerative joint disease of the right hip(s). The bone quality appears to be good for age and reported activity level.      Assessment/Plan:  End stage arthritis, right hip(s)  The patient history, physical examination, clinical judgement of the provider and imaging studies are consistent with end stage degenerative joint disease of the right hip(s) and total hip arthroplasty is deemed medically necessary. The treatment options including medical management, injection therapy, arthroscopy and arthroplasty were discussed at length. The risks and benefits of total hip arthroplasty were presented and reviewed. The risks due to aseptic loosening, infection, stiffness, dislocation/subluxation,  thromboembolic  complications and other imponderables were discussed.  The patient acknowledged the explanation, agreed to proceed with the plan and consent was signed. Patient is being admitted for inpatient treatment for surgery, pain control, PT, OT, prophylactic antibiotics, VTE prophylaxis, progressive ambulation and ADL's and discharge planning.The patient is planning to be discharged home with home health services

## 2019-05-05 ENCOUNTER — Encounter (HOSPITAL_COMMUNITY): Payer: Self-pay | Admitting: Orthopaedic Surgery

## 2019-05-05 LAB — CBC
HCT: 40.7 % (ref 36.0–46.0)
Hemoglobin: 13.8 g/dL (ref 12.0–15.0)
MCH: 31.9 pg (ref 26.0–34.0)
MCHC: 33.9 g/dL (ref 30.0–36.0)
MCV: 94.2 fL (ref 80.0–100.0)
Platelets: 305 10*3/uL (ref 150–400)
RBC: 4.32 MIL/uL (ref 3.87–5.11)
RDW: 12.7 % (ref 11.5–15.5)
WBC: 13.3 10*3/uL — ABNORMAL HIGH (ref 4.0–10.5)
nRBC: 0 % (ref 0.0–0.2)

## 2019-05-05 LAB — BASIC METABOLIC PANEL
Anion gap: 11 (ref 5–15)
BUN: 7 mg/dL — ABNORMAL LOW (ref 8–23)
CO2: 23 mmol/L (ref 22–32)
Calcium: 8.6 mg/dL — ABNORMAL LOW (ref 8.9–10.3)
Chloride: 104 mmol/L (ref 98–111)
Creatinine, Ser: 0.89 mg/dL (ref 0.44–1.00)
GFR calc Af Amer: >60 mL/min (ref >60)
GFR calc non Af Amer: >60 mL/min (ref >60)
Glucose, Bld: 186 mg/dL — ABNORMAL HIGH (ref 70–99)
Potassium: 3.1 mmol/L — ABNORMAL LOW (ref 3.5–5.1)
Sodium: 138 mmol/L (ref 135–145)

## 2019-05-05 MED ORDER — BISACODYL 10 MG RE SUPP
10.0000 mg | Freq: Every day | RECTAL | Status: DC | PRN
  Administered 2019-05-05: 10 mg via RECTAL
  Filled 2019-05-05: qty 1

## 2019-05-05 NOTE — Progress Notes (Signed)
Physical Therapy Treatment and Discharge Patient Details Name: Kristina Huang MRN: [id number] DOB: 1946-06-16 Today's Date: 05/05/2019    History of Present Illness 73 y.o. female with history of R hip OA now s/p R THA w/ anterior approach. PMH including L THA, back surgery, cholecystectomy.    PT Comments    Pt tolerated treatment well increasing ambulation distances and negotiating stairs without physical assistance. Pt has met all PT goals at this time and is appropriate for discharge form acute PT service. Pt is encouraged to ambulate outside of the room at least 3 times per day for the remainder of hospitalization. Recommendations: Home with outpatient PT, no DME needs.   Follow Up Recommendations  Outpatient PT(pt may decline)     Equipment Recommendations  None recommended by PT    Recommendations for Other Services       Precautions / Restrictions Precautions Precautions: None Restrictions Weight Bearing Restrictions: Yes RLE Weight Bearing: Weight bearing as tolerated    Mobility  Bed Mobility Overal bed mobility: Modified Independent Bed Mobility: Supine to Sit     Supine to sit: Modified independent (Device/Increase time)        Transfers Overall transfer level: Modified independent Equipment used: Rolling walker (2 wheeled) Transfers: Sit to/from Stand Sit to Stand: Modified independent (Device/Increase time)            Ambulation/Gait Ambulation/Gait assistance: Supervision Gait Distance (Feet): 150 Feet Assistive device: Rolling walker (2 wheeled) Gait Pattern/deviations: Decreased step length - right;Decreased step length - left Gait velocity: decreased   General Gait Details: Pt with reduced stride length bilaterally with slight decrease in stance time on RLE   Stairs Stairs: Yes Stairs assistance: Supervision Stair Management: No rails;Backwards;With walker Number of Stairs: 1     Wheelchair Mobility    Modified Rankin  (Stroke Patients Only)       Balance   Sitting-balance support: No upper extremity supported;Feet supported Sitting balance-Leahy Scale: Normal     Standing balance support: No upper extremity supported;During functional activity Standing balance-Leahy Scale: Fair                              Cognition Arousal/Alertness: Awake/alert Behavior During Therapy: WFL for tasks assessed/performed Overall Cognitive Status: Within Functional Limits for tasks assessed                                        Exercises      General Comments        Pertinent Vitals/Pain Pain Assessment: 0-10 Pain Score: 10-Worst pain ever Pain Location: R hip Pain Descriptors / Indicators: Aching Pain Intervention(s): Patient requesting pain meds-RN notified    Home Living                      Prior Function            PT Goals (current goals can now be found in the care plan section) Acute Rehab PT Goals Patient Stated Goal: To go home and return to PLOF Progress towards PT goals: Goals met and updated - see care plan    Frequency    BID      PT Plan Current plan remains appropriate    Co-evaluation              AM-PAC PT "6 Clicks" Mobility  Outcome Measure  Help needed turning from your back to your side while in a flat bed without using bedrails?: None Help needed moving from lying on your back to sitting on the side of a flat bed without using bedrails?: None Help needed moving to and from a bed to a chair (including a wheelchair)?: None Help needed standing up from a chair using your arms (e.g., wheelchair or bedside chair)?: None Help needed to walk in hospital room?: None Help needed climbing 3-5 steps with a railing? : None 6 Click Score: 24    End of Session Equipment Utilized During Treatment: (none) Activity Tolerance: Patient tolerated treatment well Patient left: in chair;with call bell/phone within reach;with chair  alarm set Nurse Communication: Mobility status PT Visit Diagnosis: Other abnormalities of gait and mobility (R26.89)     Time: 6568-1275 PT Time Calculation (min) (ACUTE ONLY): 24 min  Charges:  $Gait Training: 23-37 mins                     Zenaida Niece, PT, DPT Acute Rehabilitation Pager: [phone]  Zenaida Niece 05/05/2019, 3:41 PM

## 2019-05-05 NOTE — Progress Notes (Signed)
Pt noted to have increased pulse, pt stated she was having some pain at the time her VS were taken. Pt was also on the bed pan. Pulse was retaken, and was WNL

## 2019-05-05 NOTE — TOC Transition Note (Addendum)
Transition of Care [facility]) - CM/SW Discharge Note   Patient Details  Name: ASLY PYLES MRN: [id number] Date of Birth: 04-Nov-1945  [facility] CM/SW Contact:  Midge Minium RN, BSN, NCM-BC, ACM-RN [phone] Phone Number: 05/05/2019, 2:37 PM   Clinical Narrative:    CM following for DCP needs. CM spoke to the patient to discuss the POC. Patient states living at home with her spouse and being independent with AD PTA. Patient is s/p R THA; PT eval completed with outpatient PT recommended. CM discussed the recommendations with the patient, with the patient declining HHPT and Outpatient PT. Patient states, "I don't need it. I had surgery on my other knee and I know what to do." Patient reports having a BSC and RW, with spouse to assist post-discharge and transport home. No further needs from CM.    Final next level of care: Home/Self Care Barriers to Discharge: Continued Medical Work up   Patient Goals and CMS Choice Patient states their goals for this hospitalization and ongoing recovery are:: " to return to prior care "    Discharge Plan and Services         DME Arranged: N/A DME Agency: NA HH Arranged: NA HH Agency: NA  Social Determinants of Health (SDOH) Interventions     Readmission Risk Interventions No flowsheet data found.

## 2019-05-05 NOTE — Anesthesia Postprocedure Evaluation (Signed)
Anesthesia Post Note  Patient: IDAH QUIZON  Procedure(s) Performed: RIGHT TOTAL HIP ARTHROPLASTY ANTERIOR APPROACH (Right Hip)     Patient location during evaluation: PACU Anesthesia Type: General Level of consciousness: awake and alert Pain management: pain level controlled Vital Signs Assessment: post-procedure vital signs reviewed and stable Respiratory status: spontaneous breathing, nonlabored ventilation, respiratory function stable and patient connected to nasal cannula oxygen Cardiovascular status: blood pressure returned to baseline and stable Postop Assessment: no apparent nausea or vomiting Anesthetic complications: no    Last Vitals:  Vitals:   05/05/19 1536 05/05/19 1700  BP: (!) 162/86   Pulse: (!) 112 98  Resp: 15   Temp: 37.5 C   SpO2: 99%     Last Pain:  Vitals:   05/05/19 1536  TempSrc: Oral  PainSc: 8                  Kristie Bracewell

## 2019-05-05 NOTE — Progress Notes (Signed)
Subjective: 1 Day Post-Op Procedure(s) (LRB): RIGHT TOTAL HIP ARTHROPLASTY ANTERIOR APPROACH (Right) Patient reports pain as moderate.    Objective: Vital signs in last 24 hours: Temp:  [96.8 F (36 C)-98.3 F (36.8 C)] 98.1 F (36.7 C) (09/23 0438) Pulse Rate:  [71-95] 95 (09/23 0438) Resp:  [14-22] 20 (09/23 0438) BP: (130-199)/(69-101) 154/92 (09/23 0438) SpO2:  [98 %-100 %] 100 % (09/23 0438) Weight:  [69.9 kg] 69.9 kg (09/22 1303)  Intake/Output from previous day: 09/22 0701 - 09/23 0700 In: 1289.1 [I.V.:1189.1; IV Piggyback:100] Out: 200 [Blood:200] Intake/Output this shift: No intake/output data recorded.  Recent Labs    05/05/19 0059  HGB 13.8   Recent Labs    05/05/19 0059  WBC 13.3*  RBC 4.32  HCT 40.7  PLT 305   Recent Labs    05/05/19 0059  NA 138  K 3.1*  CL 104  CO2 23  BUN 7*  CREATININE 0.89  GLUCOSE 186*  CALCIUM 8.6*   No results for input(s): LABPT, INR in the last 72 hours.  Sensation intact distally Intact pulses distally Dorsiflexion/Plantar flexion intact Incision: dressing C/D/I   Assessment/Plan: 1 Day Post-Op Procedure(s) (LRB): RIGHT TOTAL HIP ARTHROPLASTY ANTERIOR APPROACH (Right) Up with therapy   Anticipated LOS equal to or greater than 2 midnights due to - Age 73 and older with one or more of the following:  - Obesity  - Expected need for hospital services (PT, OT, Nursing) required for safe  discharge  - Anticipated need for postoperative skilled nursing care or inpatient rehab  - Active co-morbidities: None OR   - Unanticipated findings during/Post Surgery: None  - Patient is a high risk of re-admission due to: None    Mcarthur Rossetti 05/05/2019, 7:29 AM

## 2019-05-05 NOTE — Evaluation (Signed)
Physical Therapy Evaluation Patient Details Name: Kristina Huang MRN: [id number] DOB: 1945/12/22 Today's Date: 05/05/2019   History of Present Illness  73 y.o. female with history of R hip OA now s/p R THA w/ anterior approach. PMH including L THA, back surgery, cholecystectomy.  Clinical Impression  Pt demonstrates deficits in gait, balance, functional mobility, endurance, power, and strength. Pt is able to perform bed mobility, transfer, and ambulate without physical assistance when utilizing RW. Pt will benefit from further acute PT to increase ambulation tolerance and initiate stair training. PT recommendations: Home with outpatient PT, no current DME needs.    Follow Up Recommendations Outpatient PT    Equipment Recommendations  None recommended by PT    Recommendations for Other Services       Precautions / Restrictions Precautions Precautions: None Restrictions Weight Bearing Restrictions: Yes RLE Weight Bearing: Weight bearing as tolerated      Mobility  Bed Mobility Overal bed mobility: Needs Assistance Bed Mobility: Supine to Sit     Supine to sit: Supervision        Transfers Overall transfer level: Needs assistance Equipment used: Rolling walker (2 wheeled) Transfers: Sit to/from Stand Sit to Stand: Supervision            Ambulation/Gait Ambulation/Gait assistance: Supervision Gait Distance (Feet): 120 Feet Assistive device: Rolling walker (2 wheeled) Gait Pattern/deviations: Step-through pattern;Decreased step length - right Gait velocity: decreased      Stairs            Wheelchair Mobility    Modified Rankin (Stroke Patients Only)       Balance Overall balance assessment: Needs assistance Sitting-balance support: No upper extremity supported;Feet supported Sitting balance-Leahy Scale: Normal     Standing balance support: No upper extremity supported Standing balance-Leahy Scale: Fair                               Pertinent Vitals/Pain Pain Assessment: 0-10 Pain Score: 10-Worst pain ever Pain Location: R hip Pain Descriptors / Indicators: Aching Pain Intervention(s): Limited activity within patient's tolerance;Patient requesting pain meds-RN notified    Home Living Family/patient expects to be discharged to:: Private residence Living Arrangements: Spouse/significant other Available Help at Discharge: Family;Available PRN/intermittently Type of Home: House Home Access: Stairs to enter Entrance Stairs-Rails: None Entrance Stairs-Number of Steps: 1 Home Layout: One level Home Equipment: Cane - single point;Walker - 2 wheels;Bedside commode      Prior Function Level of Independence: Independent with assistive device(s)         Comments: ambulatory for limited community distances with cane     Hand Dominance   Dominant Hand: Right    Extremity/Trunk Assessment   Upper Extremity Assessment Upper Extremity Assessment: Overall WFL for tasks assessed    Lower Extremity Assessment Lower Extremity Assessment: RLE deficits/detail RLE Deficits / Details: Grossly 4-/5 RLE, ROM WFL RLE Sensation: WNL    Cervical / Trunk Assessment Cervical / Trunk Assessment: Normal  Communication   Communication: No difficulties  Cognition Arousal/Alertness: Awake/alert Behavior During Therapy: WFL for tasks assessed/performed Overall Cognitive Status: Within Functional Limits for tasks assessed                                        General Comments      Exercises     Assessment/Plan    PT Assessment  Patient needs continued PT services(likely one additional visit)  PT Problem List Decreased strength;Decreased range of motion;Decreased activity tolerance;Decreased balance;Decreased mobility       PT Treatment Interventions DME instruction;Gait training;Stair training;Functional mobility training;Therapeutic activities;Therapeutic exercise;Balance  training;Patient/family education    PT Goals (Current goals can be found in the Care Plan section)  Acute Rehab PT Goals Patient Stated Goal: To return to prior level of function PT Goal Formulation: With patient Time For Goal Achievement: 05/12/19 Potential to Achieve Goals: Good    Frequency BID   Barriers to discharge        Co-evaluation               AM-PAC PT "6 Clicks" Mobility  Outcome Measure Help needed turning from your back to your side while in a flat bed without using bedrails?: None Help needed moving from lying on your back to sitting on the side of a flat bed without using bedrails?: None Help needed moving to and from a bed to a chair (including a wheelchair)?: None Help needed standing up from a chair using your arms (e.g., wheelchair or bedside chair)?: None Help needed to walk in hospital room?: None Help needed climbing 3-5 steps with a railing? : None 6 Click Score: 24    End of Session Equipment Utilized During Treatment: Gait belt Activity Tolerance: Patient tolerated treatment well Patient left: in chair;with call bell/phone within reach;with chair alarm set;with nursing/sitter in room Nurse Communication: Mobility status PT Visit Diagnosis: Other abnormalities of gait and mobility (R26.89)    Time: [id number] PT Time Calculation (min) (ACUTE ONLY): 26 min   Charges:   PT Evaluation $PT Eval Low Complexity: 1 Low PT Treatments $Gait Training: 8-22 mins        Zenaida Niece, PT, DPT Acute Rehabilitation Pager: [phone]  Zenaida Niece 05/05/2019, 12:40 PM

## 2019-05-06 LAB — BASIC METABOLIC PANEL
Anion gap: 10 (ref 5–15)
BUN: 8 mg/dL (ref 8–23)
CO2: 26 mmol/L (ref 22–32)
Calcium: 8.4 mg/dL — ABNORMAL LOW (ref 8.9–10.3)
Chloride: 101 mmol/L (ref 98–111)
Creatinine, Ser: 0.82 mg/dL (ref 0.44–1.00)
GFR calc Af Amer: >60 mL/min (ref >60)
GFR calc non Af Amer: >60 mL/min (ref >60)
Glucose, Bld: 102 mg/dL — ABNORMAL HIGH (ref 70–99)
Potassium: 3.3 mmol/L — ABNORMAL LOW (ref 3.5–5.1)
Sodium: 137 mmol/L (ref 135–145)

## 2019-05-06 LAB — CBC
HCT: 36.6 % (ref 36.0–46.0)
Hemoglobin: 12.3 g/dL (ref 12.0–15.0)
MCH: 31.9 pg (ref 26.0–34.0)
MCHC: 33.6 g/dL (ref 30.0–36.0)
MCV: 95.1 fL (ref 80.0–100.0)
Platelets: 242 10*3/uL (ref 150–400)
RBC: 3.85 MIL/uL — ABNORMAL LOW (ref 3.87–5.11)
RDW: 12.8 % (ref 11.5–15.5)
WBC: 10.3 10*3/uL (ref 4.0–10.5)
nRBC: 0 % (ref 0.0–0.2)

## 2019-05-06 MED ORDER — PNEUMOCOCCAL VAC POLYVALENT 25 MCG/0.5ML IJ INJ
0.5000 mL | INJECTION | INTRAMUSCULAR | Status: AC
  Administered 2019-05-06: 0.5 mL via INTRAMUSCULAR
  Filled 2019-05-06: qty 0.5

## 2019-05-06 MED ORDER — METHOCARBAMOL 500 MG PO TABS: 500.0000 mg | ORAL_TABLET | Freq: Four times a day (QID) | ORAL | 1 refills | Status: DC | PRN

## 2019-05-06 MED ORDER — INFLUENZA VAC A&B SA ADJ QUAD 0.5 ML IM PRSY
0.5000 mL | PREFILLED_SYRINGE | INTRAMUSCULAR | Status: AC
  Administered 2019-05-06: 0.5 mL via INTRAMUSCULAR
  Filled 2019-05-06: qty 0.5

## 2019-05-06 MED ORDER — OXYCODONE HCL 5 MG PO TABS: 5.0000 mg | ORAL_TABLET | Freq: Four times a day (QID) | ORAL | 0 refills | Status: DC | PRN

## 2019-05-06 MED ORDER — ASPIRIN 81 MG PO CHEW: 81.0000 mg | CHEWABLE_TABLET | Freq: Two times a day (BID) | ORAL | 0 refills | Status: DC

## 2019-05-06 MED ORDER — BISACODYL 10 MG RE SUPP: 10.0000 mg | Freq: Every day | RECTAL | 1 refills | Status: DC | PRN

## 2019-05-06 MED ORDER — POTASSIUM CHLORIDE CRYS ER 20 MEQ PO TBCR
40.0000 meq | EXTENDED_RELEASE_TABLET | Freq: Two times a day (BID) | ORAL | Status: DC
  Administered 2019-05-06 – 2019-05-07 (×3): 40 meq via ORAL
  Filled 2019-05-06 (×2): qty 2

## 2019-05-06 NOTE — Plan of Care (Signed)

## 2019-05-06 NOTE — Discharge Instructions (Signed)

## 2019-05-06 NOTE — Progress Notes (Signed)
In to get bedside shift report. Pt stated that she "loved the suppository, I need the doctor to give me some of them for at home." Pt stated that she had several small stools through the night. Pt alert, up in chair. No acute distress. Said she is ready to go home.

## 2019-05-06 NOTE — Progress Notes (Signed)
Patient ID: Otilio Miu, female   DOB: 12/27/1945, 73 y.o.   MRN: [id number] The patient is very comfortable this morning and reports only similar pain to what she had on admission with her right hip.  She is done very well with physical therapy and is cleared physical therapy.  However she has been tachycardic.  Her hemoglobin/hematocrit were normal yesterday.  The only thing that I see from her labs that she does have low potassium and this is been chronic for her.  Nursing was concerned about her being tachycardic.  The patient denies any shortness of breath or chest pain.  I did obtain an EKG and it showed sinus tachycardia and her heart rate was 110.  Her potassium yesterday postoperative was 3.1.  Given this hypokalemia, I will order oral potassium chloride to take 40 mEq twice daily for today and will order a new BMET and CBC today.  Her only other complaint is constipation and she was given a suppository.  We will have her continue to mobilize today.  The plan will then be to check her potassium again in the morning and hopefully discharge her tomorrow.

## 2019-05-06 NOTE — Progress Notes (Signed)
Provider notified on days

## 2019-05-07 LAB — BASIC METABOLIC PANEL
Anion gap: 7 (ref 5–15)
BUN: 8 mg/dL (ref 8–23)
CO2: 26 mmol/L (ref 22–32)
Calcium: 8.3 mg/dL — ABNORMAL LOW (ref 8.9–10.3)
Chloride: 104 mmol/L (ref 98–111)
Creatinine, Ser: 0.59 mg/dL (ref 0.44–1.00)
GFR calc Af Amer: >60 mL/min (ref >60)
GFR calc non Af Amer: >60 mL/min (ref >60)
Glucose, Bld: 128 mg/dL — ABNORMAL HIGH (ref 70–99)
Potassium: 3.3 mmol/L — ABNORMAL LOW (ref 3.5–5.1)
Sodium: 137 mmol/L (ref 135–145)

## 2019-05-07 NOTE — Plan of Care (Signed)
Pt for discharge going home, alert and oriented, able to tolerate her meals, can moved transfer to bedside commode, wound site dry and intact, discontinued peripheral IV line, given health teachings, next appointment, due med explained and understood, given all he personal belongings, given pain med prior to discharge.

## 2019-05-07 NOTE — Progress Notes (Signed)
Patient ID: Kristina Huang, female   DOB: [birth year], 73 y.o.   MRN: [id number] Doing well overall.  Right hip stable.  Potassium improved and vitals stable.  Feels well.  Can be discharged to home today.

## 2019-05-07 NOTE — Discharge Summary (Signed)
Patient ID: Kristina Huang MRN: [id number] DOB/AGE: 73-28-1947 73 y.o.  Admit date: 05/04/2019 Discharge date: 05/07/2019  Admission Diagnoses:  Principal Problem:   Unilateral primary osteoarthritis, right hip Active Problems:   Status post total replacement of right hip   Discharge Diagnoses:  Same  Past Medical History:  Diagnosis Date  . Acid reflux   . Arthritis   . Cancer ([place])    Thyroid  . Diabetes mellitus without complication (HCC)    fasting 100-130  . Hypercholesterolemia   . Hypertension   . Thyroid goiter    right tracheal deviation by CXR 10/2013    Surgeries: Procedure(s): RIGHT TOTAL HIP ARTHROPLASTY ANTERIOR APPROACH on 05/04/2019   Consultants:   Discharged Condition: Improved  Hospital Course: Kristina Huang is an 73 y.o. female who was admitted 05/04/2019 for operative treatment ofUnilateral primary osteoarthritis, right hip. Patient has severe unremitting pain that affects sleep, daily activities, and work/hobbies. After pre-op clearance the patient was taken to the operating room on 05/04/2019 and underwent  Procedure(s): RIGHT TOTAL HIP ARTHROPLASTY ANTERIOR APPROACH.    Patient was given perioperative antibiotics:  Anti-infectives (From admission, onward)   Start     Dose/Rate Route Frequency Ordered Stop   05/05/19 0600  ceFAZolin (ANCEF) IVPB 2g/100 mL premix     2 g 200 mL/hr over 30 Minutes Intravenous On call to O.R. 05/04/19 1307 05/04/19 1612   05/04/19 2200  ceFAZolin (ANCEF) IVPB 1 g/50 mL premix     1 g 100 mL/hr over 30 Minutes Intravenous Every 6 hours 05/04/19 2036 05/05/19 0503   05/04/19 1310  ceFAZolin (ANCEF) 2-4 GM/100ML-% IVPB    Note to Pharmacy: Kristina Huang   : cabinet override      05/04/19 1310 05/04/19 1612       Patient was given sequential compression devices, early ambulation, and chemoprophylaxis to prevent DVT.  Patient benefited maximally from hospital stay and there were no complications.     Recent vital signs:  Patient Vitals for the past 24 hrs:  BP Temp Temp src Pulse Resp SpO2  05/07/19 0412 127/78 98.6 F (37 C) Oral (!) 105 18 99 %  05/06/19 1948 (!) 134/91 98.1 F (36.7 C) Oral (!) 112 - 98 %  05/06/19 1548 118/79 99 F (37.2 C) Oral (!) 124 14 99 %  05/06/19 0834 131/75 99.1 F (37.3 C) Oral (!) 121 16 100 %     Recent laboratory studies:  Recent Labs    05/05/19 0059 05/06/19 1100 05/07/19 0355  WBC 13.3* 10.3  --   HGB 13.8 12.3  --   HCT 40.7 36.6  --   PLT 305 242  --   NA 138 137 137  K 3.1* 3.3* 3.3*  CL 104 101 104  CO2 23 26 26   BUN 7* 8 8  CREATININE 0.89 0.82 0.59  GLUCOSE 186* 102* 128*  CALCIUM 8.6* 8.4* 8.3*     Discharge Medications:   Allergies as of 05/07/2019   No Known Allergies     Medication List    STOP taking these medications   aspirin EC 81 MG tablet Replaced by: aspirin 81 MG chewable tablet   diclofenac 75 MG EC tablet Commonly known as: VOLTAREN     TAKE these medications   Accu-Chek Aviva Plus test strip Generic drug: glucose blood   Accu-Chek Softclix Lancets lancets   acetaminophen 500 MG tablet Commonly known as: TYLENOL Take 1,000 mg by mouth every 8 (eight) hours  as needed for moderate pain or headache.   aspirin 81 MG chewable tablet Chew 1 tablet (81 mg total) by mouth 2 (two) times daily. Replaces: aspirin EC 81 MG tablet   bisacodyl 10 MG suppository Commonly known as: DULCOLAX Place 1 suppository (10 mg total) rectally daily as needed for moderate constipation.   levothyroxine 112 MCG tablet Commonly known as: SYNTHROID Take 1 tablet (112 mcg total) by mouth daily before breakfast.   losartan 50 MG tablet Commonly known as: COZAAR Take 50 mg by mouth daily.   metFORMIN 500 MG tablet Commonly known as: GLUCOPHAGE Take 500 mg by mouth daily with breakfast.   methocarbamol 500 MG tablet Commonly known as: ROBAXIN Take 1 tablet (500 mg total) by mouth every 6 (six) hours as  needed for muscle spasms.   omeprazole 20 MG capsule Commonly known as: PRILOSEC Take 20 mg by mouth daily.   oxyCODONE 5 MG immediate release tablet Commonly known as: Oxy IR/ROXICODONE Take 1-2 tablets (5-10 mg total) by mouth every 6 (six) hours as needed for moderate pain (pain score 4-6).            Durable Medical Equipment  (From admission, onward)         Start     Ordered   05/04/19 2037  DME 3 n 1  Once     05/04/19 2036   05/04/19 2037  DME Walker rolling  Once    Question:  Patient needs a walker to treat with the following condition  Answer:  Status post total replacement of right hip   05/04/19 2036          Diagnostic Studies: US Soft Tissue Head/neck  Result Date: 04/28/2019 CLINICAL DATA:  Thyroid carcinoma post thyroidectomy EXAM: THYROID ULTRASOUND TECHNIQUE: Ultrasound examination of the thyroidectomy bed and adjacent soft tissues was performed. COMPARISON:  02/23/2018 and previous FINDINGS: Parenchymal Echotexture: None seen Isthmus: Surgically absent Right lobe: Surgically absent Left lobe: Surgically absent _________________________________________________________ Estimated total number of nodules >/= 1 cm: 0 Number of spongiform nodules >/=  2 cm not described below (TR1): 0 Number of mixed cystic and solid nodules >/= 1.5 cm not described below ([place]): 0 _________________________________________________________ No discrete nodules are seen within the thyroidectomy bed. No regional adenopathy identified. IMPRESSION: 1. No residual/recurrent tissue post thyroidectomy. No adenopathy identified. The above is in keeping with the ACR TI-RADS recommendations - J Am Coll Radiol 2017;14:587-595. Electronically Signed   By: Lucrezia Europe M.D.   On: 04/28/2019 12:11   Dg Pelvis Portable  Result Date: 05/04/2019 CLINICAL DATA:  Status post total right hip replacement. EXAM: PORTABLE PELVIS 1-2 VIEWS COMPARISON:  Preoperative radiographs 07/21/2018 FINDINGS: Right hip  arthroplasty in expected alignment. No periprosthetic lucency or fracture. Femoral stem is midline. Expected postsurgical change in the soft tissues with air and edema. Lateral skin staples. Left hip arthroplasty is intact. IMPRESSION: Recent right hip arthroplasty in expected alignment. No immediate postoperative complication. Electronically Signed   By: Keith Rake M.D.   On: 05/04/2019 20:30   Dg C-arm 1-60 Min  Result Date: 05/04/2019 CLINICAL DATA:  Status post right hip arthroplasty. EXAM: DG C-ARM 1-60 MIN; OPERATIVE RIGHT HIP WITH PELVIS CONTRAST:  None FLUOROSCOPY TIME:  Fluoroscopy Time:  27 seconds Radiation Exposure Index (if provided by the fluoroscopic device): Not available Number of Acquired Spot Images: 0 COMPARISON:  07/21/2018 FINDINGS: Four images obtained via C-arm radiography show total right hip arthroplasty. The hardware components are in anatomic alignment and no  complications identified. IMPRESSION: 1. Status post right hip arthroplasty.  No complications. Electronically Signed   By: Kerby Moors M.D.   On: 05/04/2019 20:09   Dg Hip Operative Unilat W Or W/o Pelvis Right  Result Date: 05/04/2019 CLINICAL DATA:  Status post right hip arthroplasty. EXAM: DG C-ARM 1-60 MIN; OPERATIVE RIGHT HIP WITH PELVIS CONTRAST:  None FLUOROSCOPY TIME:  Fluoroscopy Time:  27 seconds Radiation Exposure Index (if provided by the fluoroscopic device): Not available Number of Acquired Spot Images: 0 COMPARISON:  07/21/2018 FINDINGS: Four images obtained via C-arm radiography show total right hip arthroplasty. The hardware components are in anatomic alignment and no complications identified. IMPRESSION: 1. Status post right hip arthroplasty.  No complications. Electronically Signed   By: Kerby Moors M.D.   On: 05/04/2019 20:09    Disposition: Discharge disposition: 01-Home or [place] Follow up.   Specialty: [place] Why: Home Health Physical Therapy Contact information: [address] [place] [address] [phone]        Mcarthur Rossetti, MD. Schedule an appointment as soon as possible for a visit in 2 week(s).   Specialty: Orthopedic Surgery Contact information: [place] [address] [phone]            Signed: Mcarthur Rossetti 05/07/2019, 6:43 AM

## 2019-05-10 ENCOUNTER — Other Ambulatory Visit: Payer: Self-pay | Admitting: Orthopaedic Surgery

## 2019-05-10 ENCOUNTER — Telehealth: Payer: Self-pay | Admitting: Orthopaedic Surgery

## 2019-05-10 MED ORDER — HYDROCODONE-ACETAMINOPHEN 7.5-325 MG PO TABS: 1.0000 | ORAL_TABLET | Freq: Four times a day (QID) | ORAL | 0 refills | Status: DC | PRN

## 2019-05-10 NOTE — Telephone Encounter (Signed)
[facility] with [facility] called in requesting verbal orders for home health pt for 1 time a week for 1 week and 3 times a week for 2 weeks.    [phone]

## 2019-05-10 NOTE — Telephone Encounter (Signed)
Nurse from [facility] called in reference to the patient's medication.  The patient advised the nurse that her pain medication will not be available at her current pharmacy for about a month.  The nurse from [facility] wanted [place] to reach out to the patient to get the medication sent to another pharmacy.  CB#[phone].  Thank you.

## 2019-05-10 NOTE — Telephone Encounter (Signed)
Can you send something else in I couldn't get her on the phone

## 2019-05-10 NOTE — Telephone Encounter (Signed)
[facility] called. They are out of Oxycodone. Would like to know if patient could get something else prescribed for pain. Patient would like for someone to call her. [phone]

## 2019-05-14 ENCOUNTER — Encounter: Payer: Self-pay | Admitting: "Endocrinology

## 2019-05-14 ENCOUNTER — Other Ambulatory Visit: Payer: Self-pay

## 2019-05-14 ENCOUNTER — Ambulatory Visit (INDEPENDENT_AMBULATORY_CARE_PROVIDER_SITE_OTHER): Payer: Medicare PPO | Admitting: "Endocrinology

## 2019-05-14 DIAGNOSIS — E890 Postprocedural hypothyroidism: Secondary | ICD-10-CM | POA: Diagnosis not present

## 2019-05-14 DIAGNOSIS — C73 Malignant neoplasm of thyroid gland: Secondary | ICD-10-CM | POA: Diagnosis not present

## 2019-05-14 NOTE — Progress Notes (Addendum)
05/14/2019                                Endocrinology Telehealth Visit Follow up Note -During COVID -19 Pandemic  I connected with Kristina Huang on 05/14/2019   by telephone and verified that I am speaking with the correct person using two identifiers. Kristina Huang, 1946/02/18. she has verbally consented to this visit. All issues noted in this document were discussed and addressed. The format was not optimal for physical exam.  Subjective:    Patient ID: Kristina Huang, female    DOB: 1946-06-25, PCP Kristina Richard, MD   Past Medical History:  Diagnosis Date  . Acid reflux   . Arthritis   . Cancer ([place])    Thyroid  . Diabetes mellitus without complication (HCC)    fasting 100-130  . Hypercholesterolemia   . Hypertension   . 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 BACK SURGERY    . BUNIONECTOMY Bilateral   . CHOLECYSTECTOMY     Dr. Irving Shows  . COLONOSCOPY  2009   Dr. Oneida Alar: moderate internal hemorrhoids, frequent sigmoid colon diverticula, polyps X 3, one large adenoma otherwise hyperplastic  . COLONOSCOPY N/A 09/09/2014   Procedure: COLONOSCOPY;  Surgeon: Danie Binder, MD;  [facility];  Service: Endoscopy;  Laterality: N/A;  200 - moved to 1/29 @ 12:45  . COLONOSCOPY N/A 10/24/2017   Procedure: COLONOSCOPY;  Surgeon: Danie Binder, MD;  [facility];  Service: Endoscopy;  Laterality: N/A;  9:30  . ESOPHAGOGASTRODUODENOSCOPY N/A 09/09/2014   Procedure: ESOPHAGOGASTRODUODENOSCOPY (EGD);  Surgeon: Danie Binder, MD;  [facility];  Service: Endoscopy;  Laterality: N/A;  . EYE SURGERY Right   . JOINT REPLACEMENT    . LAMINECTOMY  1997  . MAXIMUM ACCESS (MAS)POSTERIOR LUMBAR INTERBODY FUSION (PLIF) 2 LEVEL  11/2013   POSTERIOR LUMBAR INTERBODY FUSION POSTERIOR LATERAL ARTHRODESIS POSTERIOR NONSEGMENTAL INSTRUMENTATION  LUMBAR  TWO-THREE ,LUMBAR THREE-FOUR WITH HARDWARE REMOVAL LUMBAR FOUR-FIVE/notes 11/17/2013  . THYROIDECTOMY N/A 02/05/2017   Procedure: THYROIDECTOMY;  Surgeon: Aviva Signs, MD;  Location: [facility];  Service: General;  Laterality: N/A;  . TOTAL HIP ARTHROPLASTY Left 07/21/2018  . TOTAL HIP ARTHROPLASTY Left 07/21/2018   Procedure: LEFT TOTAL HIP ARTHROPLASTY ANTERIOR APPROACH;  Surgeon: Mcarthur Rossetti, MD;  Location: [facility];  Service: Orthopedics;  Laterality: Left;  . TOTAL HIP ARTHROPLASTY Right 05/04/2019   Procedure: RIGHT TOTAL HIP ARTHROPLASTY ANTERIOR APPROACH;  Surgeon: Mcarthur Rossetti, MD;  Location: [facility];  Service: Orthopedics;  Laterality: Right;  . TUBAL LIGATION     Social History   Socioeconomic History  . Marital status: Divorced    Spouse name: Not on file  . Number of children: Not on file  . Years of education: Not on file  . Highest education level: Not on file  Occupational History  . Not on file  Social Needs  . Financial resource strain: Not on file  . Food insecurity    Worry: Not on file    Inability: Not on file  . Transportation needs    Medical: Not on file    Non-medical:  Not on file  Tobacco Use  . Smoking status: Former Smoker    Packs/day: 0.25    Years: 2.00    Pack years: 0.50    Types: Cigarettes    Quit date: 01/31/2010    Years since quitting: 9.2  . Smokeless tobacco: Never Used  Substance and Sexual Activity  . Alcohol use: No    Alcohol/week: 0.0 standard drinks  . Drug use: No  . Sexual activity: Not Currently    Birth control/protection: Surgical  Lifestyle  . Physical activity    Days per week: Not on file    Minutes per session: Not on file  . Stress: Not on file  Relationships  . Social Herbalist on phone: Not on file    Gets together: Not on file    Attends religious service: Not on file    Active member of club or organization: Not on file    Attends meetings of clubs or organizations: Not on file     Relationship status: Not on file  Other Topics Concern  . Not on file  Social History Narrative  . Not on file   Outpatient Encounter Medications as of 05/14/2019  Medication Sig  . ACCU-CHEK AVIVA PLUS test strip   . ACCU-CHEK SOFTCLIX LANCETS lancets   . acetaminophen (TYLENOL) 500 MG tablet Take 1,000 mg by mouth every 8 (eight) hours as needed for moderate pain or headache.   Marland Kitchen aspirin 81 MG chewable tablet Chew 1 tablet (81 mg total) by mouth 2 (two) times daily.  . bisacodyl (DULCOLAX) 10 MG suppository Place 1 suppository (10 mg total) rectally daily as needed for moderate constipation.  Marland Kitchen HYDROcodone-acetaminophen (NORCO) 7.5-325 MG tablet Take 1 tablet by mouth every 6 (six) hours as needed for moderate pain.  Marland Kitchen levothyroxine (SYNTHROID, LEVOTHROID) 112 MCG tablet Take 1 tablet (112 mcg total) by mouth daily before breakfast.  . losartan (COZAAR) 50 MG tablet Take 50 mg by mouth daily.   . metFORMIN (GLUCOPHAGE) 500 MG tablet Take 500 mg by mouth daily with breakfast.  . methocarbamol (ROBAXIN) 500 MG tablet Take 1 tablet (500 mg total) by mouth every 6 (six) hours as needed for muscle spasms.  Marland Kitchen omeprazole (PRILOSEC) 20 MG capsule Take 20 mg by mouth daily.   No facility-administered encounter medications on file as of 05/14/2019.    ALLERGIES: No Known Allergies  VACCINATION STATUS: Immunization History  Administered Date(s) Administered  . Fluad Quad(high Dose 65+) 05/06/2019  . Pneumococcal Polysaccharide-23 05/06/2019    HPI 73 year old female patient with medical history as above.  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  - She underwent total thyroidectomy on 02/05/2017 which revealed a  papillary thyroid carcinoma on the right lower thyroid-0.5 cm foci with local extrathyroidal extension and another 0.2 cm separate tumor in the right  lobe. The left lobe was said to be consistent with hyperplastic nodule. - She underwent I-131 thyroid remnant ablation on 04/15/2017 followed by post therapy whole body scan. -  On the scan, she was found to have foci of abnormal radioiodine accumulation in the cervical region, suggestive of thyroid remnant or metastatic cervical lymph node.  -Her previsit thyroid/neck ultrasound confirms decrease in size of this lymph node to 0.3 cm and no new concerns.  -She denies dysphagia, shortness of breath, nor voice change.  Her most recent thyroid/neck ultrasound is negative  for any residual thyroid tissue nor cervical adenopathy. -She is currently on levothyroxine 112 mcg p.o. daily before breakfast.  she reports compliance to this medication.  - She denies any family history of thyroid cancer, however one of her sisters have hyperthyroidism status post treatment with what appears to be radioactive iodine therapy.    Objective:    There were no vitals taken for this visit.  Wt Readings from Last 3 Encounters:  05/04/19 154 lb (69.9 kg)  04/30/19 154 lb 8 oz (70.1 kg)  03/09/19 150 lb (68 kg)       CMP ( most recent) CMP     Component Value Date/Time   NA 137 05/07/2019 0355   K 3.3 (L) 05/07/2019 0355   CL 104 05/07/2019 0355   CO2 26 05/07/2019 0355   GLUCOSE 128 (H) 05/07/2019 0355   BUN 8 05/07/2019 0355   CREATININE 0.59 05/07/2019 0355   CREATININE 0.80 03/26/2018 1041   CALCIUM 8.3 (L) 05/07/2019 0355   PROT 7.2 03/26/2018 1041   ALBUMIN 3.2 (L) 02/06/2017 0444   AST 16 03/26/2018 1041   ALT 15 03/26/2018 1041   ALKPHOS 68 02/06/2017 0444   BILITOT 0.4 03/26/2018 1041   GFRNONAA >60 05/07/2019 0355   GFRNONAA 74 03/26/2018 1041   GFRAA >60 05/07/2019 0355   GFRAA 85 03/26/2018 1041        Fine-needle aspiration cytology of the thyroid on 10/23/2016 showed: THYROID, FINE NEEDLE ASPIRATION, LEFT (SPECIMEN 1 OF 1, COLLECTED 10/23/16): CONSISTENT WITH BENIGN  FOLLICULAR NODULE (B[place]ES[place] CATEGORY II).  02/05/2017: She underwent total thyroidectomy: 02/05/2017: INAL DIAGNOSIS Diagnosis 1. Thyroid, lobectomy, left - NODULAR HYPERPLASIA (MULTINODULAR GOITER). 2. Thyroid, lobectomy, right - PAPILLARY THYROID CARCINOMA, TWO TUMORS, 0.5 AND 0.2 CM. - PAPILLARY CARCINOMA FOCALLY EXTENDS INTO THE PERITHYROIDAL CONNECTIVE TISSUE. - HURTHLE CELL ADENOMATOUS NODULE. - NODULAR HYPERPLASIA.  TNM code: pT1a , pNX.   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  No distant sites of iodine-avid metastatic thyroid cancer Identified.  Surveillance thyroid/neck ultrasound from 05/19/2017 showed:  Surgically absent thyroid lobes. A single morphologically unremarkable 0.8 cm left cervical lymph node is noted. No pathologic adenopathy.  IMPRESSION: No evidence of residual/ recurrent tissue post thyroidectomy   Thyroid/neck ultrasound on February 23, 2018 IMPRESSION: 1. Post total thyroidectomy without evidence of residual nodular tissue within the thyroidectomy resection bed. 2. Solitary left-sided cervical lymph node is not enlarged by size criteria measuring 0.3 cm in diameter. No definitive bulky regional cervical lymphadenopathy.   Results for CAMBRA, ADWELL (MRN [id number]) as of 05/14/2019 12:40  Ref. Range 11/04/2018 12:01 04/30/2019 15:10  TSH Latest Ref Range: 0.40 - 4.50 Huang/L 0.20 (L) 0.69  T4,Free(Direct) Latest Ref Range: 0.8 - 1.8 ng/dL 1.5 1.5  Thyroglobulin Latest Units: ng/mL <0.1 (L) 0.1 (L)  Thyroglobulin Ab Latest Ref Range: < or = 1 IU/mL <1 <1   Thyroid/neck ultrasound on April 28, 2019 No discrete nodules are seen within the thyroidectomy bed. No regional adenopathy identified.  IMPRESSION: 1. No residual/recurrent tissue post thyroidectomy. No adenopathy identified.  The above is in keeping with  the ACR TI-RADS recommendations - J Am Coll Radiol 2017;14:587-595.  Assessment & Plan:   1. 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 Her surgical sample  unexpectedly revealed occult malignancy on the right lobe of the thyroid, 0.5 cm  with focal extrathyroidal extension and 0.2 cm foci.  TNM code: pT1a , pNX. - She is status post total thyroidectomy and I-131 thyroid remnant ablation on 04/15/2017.  - Thyroglobulin level and thyroglobulin antibodies are  low  on the day of or body scan. 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  No distant sites of iodine-avid metastatic thyroid cancer Identified.  - Surveillance  thyroid/neck ultrasound - is unremarkable except for nonspecific 0.8 cm lymph node on the left cervical region.   Subsequent thyroid/neck ultrasound on February 23, 2018 showed decreasing size of his nonspecific lymph node to 0.3 cm. -Her most recent thyroid/neck ultrasound is negative for residual thyroid tissue nor thyroid lymphadenopathy.      2. Postsurgical hypothyroidism  -Her thyroid function tests are consistent with appropriate replacement with appropriately suppressed TSH.   She is advised to continue levothyroxine 112 mcg p.o. daily before breakfast.     - The goal of therapy in thyroid cancer suppressive therapy is to keep TSH between 0.1-0.5 .   - We discussed about the correct intake of her thyroid hormone, on empty stomach at fasting, with water, separated by at least 30 minutes from breakfast and other medications,  and separated by more than 4 hours from calcium, iron, multivitamins, acid reflux medications (PPIs). -Patient is made aware of the fact that thyroid hormone replacement is needed for life, dose to be adjusted by  periodic monitoring of thyroid function tests.  - Patient Care Time Today:  25 min, of which >50% was spent in  counseling and the rest reviewing her  current and  previous labs/studies, previous treatments,  and medications' doses and developing a plan for long-term care based on the latest recommendations for standards of care.   Kristina Huang participated in the discussions, expressed understanding, and voiced agreement with the above plans.  All questions were answered to her satisfaction. she is encouraged to contact clinic should she have any questions or concerns prior to her return visit.  - I advised patient to maintain close follow up with Kristina Richard, MD for primary care needs.  Follow up plan: Return in about 6 months (around 11/12/2019) for Follow up with Pre-visit Labs.  Glade Lloyd, MD Phone: [phone]  Fax: [phone]  This note was partially dictated with voice recognition software. Similar sounding words can be transcribed inadequately or may not  be corrected upon review.  05/14/2019, 12:39 PM

## 2019-05-18 NOTE — Addendum Note (Signed)
Addended by: Cassandria Anger on: 05/18/2019 02:43 PM   Modules accepted: Level of Service

## 2019-05-19 ENCOUNTER — Ambulatory Visit (INDEPENDENT_AMBULATORY_CARE_PROVIDER_SITE_OTHER): Payer: Medicare PPO | Admitting: Orthopaedic Surgery

## 2019-05-19 ENCOUNTER — Encounter: Payer: Self-pay | Admitting: Orthopaedic Surgery

## 2019-05-19 DIAGNOSIS — Z96641 Presence of right artificial hip joint: Secondary | ICD-10-CM

## 2019-05-19 MED ORDER — HYDROCODONE-ACETAMINOPHEN 7.5-325 MG PO TABS: 1.0000 | ORAL_TABLET | Freq: Four times a day (QID) | ORAL | 0 refills | Status: DC | PRN

## 2019-05-19 NOTE — Progress Notes (Signed)
HPI: Mrs. Kope returns today 2 weeks status post right total hip arthroplasty.  She is overall doing well.  She has had no pain.  She is using a cane to ambulate.  She is  does note some swelling about the right hip.  She had no fevers, shortness of breath or chest pain.  Physical exam: Right hip surgical incisions healing well no signs of infection.  Staples well approximate skin.  Slight seroma.  Calf supple nontender.  Dorsiflexion plantarflexion ankle intact.  Ambulates with a single-point cane.  Impression: 2-week status post right total hip arthroplasty   Plan: Staples removed Steri-Strips applied.  Scar tissue mobilization encouraged.  Seroma was aspirated today 50 cc of serosanguineous fluid obtained patient tolerates well.  She will continue work with therapy and then discharged home exercise program.  Follow-up with [place] in 1 month sooner if there is any questions or concerns.

## 2019-05-19 NOTE — Addendum Note (Signed)
Addended by: Erskine Emery on: 05/19/2019 10:07 AM   Modules accepted: Orders

## 2019-05-26 ENCOUNTER — Other Ambulatory Visit: Payer: Self-pay

## 2019-05-26 ENCOUNTER — Other Ambulatory Visit: Payer: Self-pay | Admitting: "Endocrinology

## 2019-05-26 MED ORDER — LEVOTHYROXINE SODIUM 112 MCG PO TABS: ORAL_TABLET | ORAL | 0 refills | Status: DC

## 2019-06-21 ENCOUNTER — Other Ambulatory Visit: Payer: Self-pay

## 2019-06-21 ENCOUNTER — Encounter: Payer: Self-pay | Admitting: Orthopaedic Surgery

## 2019-06-21 ENCOUNTER — Ambulatory Visit (INDEPENDENT_AMBULATORY_CARE_PROVIDER_SITE_OTHER): Payer: Medicare PPO | Admitting: Orthopaedic Surgery

## 2019-06-21 DIAGNOSIS — Z96642 Presence of left artificial hip joint: Secondary | ICD-10-CM

## 2019-06-21 DIAGNOSIS — Z96641 Presence of right artificial hip joint: Secondary | ICD-10-CM

## 2019-06-21 NOTE — Progress Notes (Signed)
The patient is 6 weeks status post a right total hip arthroplasty.  She is over a year out from a left total hip arthroplasty.  She is a very active 73 year old female.  She states she is doing well overall.  On exam she just has a little bit of residual hematoma in the proximal hip area on the right side but no seroma.  Her leg lengths are equal.  She tolerates me easily put in both hips to internal and external rotation.  She does ambulate with a cane.  She is pleased with her progress.  All questions concerns were answered and addressed.  I talked with her about coming back to see me in 6 months for repeat x-rays but she feels that she is doing well with it if she is having any issues at all she will call us to be seen.  She will otherwise then follow-up as needed.                                                                                                                                        Plan  I will write start        [place] so that it automatically print through all already

## 2019-09-28 ENCOUNTER — Other Ambulatory Visit: Payer: Self-pay | Admitting: "Endocrinology

## 2019-10-12 ENCOUNTER — Other Ambulatory Visit (HOSPITAL_COMMUNITY): Payer: Self-pay | Admitting: Internal Medicine

## 2019-10-12 DIAGNOSIS — Z1231 Encounter for screening mammogram for malignant neoplasm of breast: Secondary | ICD-10-CM

## 2019-10-21 ENCOUNTER — Ambulatory Visit (HOSPITAL_COMMUNITY): Payer: Medicare PPO

## 2019-11-04 ENCOUNTER — Other Ambulatory Visit: Payer: Self-pay

## 2019-11-04 ENCOUNTER — Ambulatory Visit (HOSPITAL_COMMUNITY)
Admission: RE | Admit: 2019-11-04 | Discharge: 2019-11-04 | Disposition: A | Discharge status: Home / Self Care | Payer: Medicare PPO | Source: Ambulatory Visit | Attending: Internal Medicine | Admitting: Internal Medicine

## 2019-11-04 DIAGNOSIS — Z1231 Encounter for screening mammogram for malignant neoplasm of breast: Secondary | ICD-10-CM

## 2019-11-08 LAB — TSH: TSH: 0.56 mIU/L (ref 0.40–4.50)

## 2019-11-08 LAB — T4, FREE: Free T4: 1.6 ng/dL (ref 0.8–1.8)

## 2019-11-12 ENCOUNTER — Ambulatory Visit: Payer: Medicare PPO | Admitting: "Endocrinology

## 2019-11-15 ENCOUNTER — Encounter: Payer: Self-pay | Admitting: "Endocrinology

## 2019-11-15 ENCOUNTER — Ambulatory Visit (INDEPENDENT_AMBULATORY_CARE_PROVIDER_SITE_OTHER): Payer: Medicare PPO | Admitting: "Endocrinology

## 2019-11-15 DIAGNOSIS — C73 Malignant neoplasm of thyroid gland: Secondary | ICD-10-CM | POA: Diagnosis not present

## 2019-11-15 DIAGNOSIS — E890 Postprocedural hypothyroidism: Secondary | ICD-10-CM | POA: Diagnosis not present

## 2019-11-15 NOTE — Progress Notes (Signed)
11/15/2019                                    Endocrinology Telehealth Visit Follow up Note -During COVID -19 Pandemic  I connected with Kristina Huang on 11/16/2019   by telephone and verified that I am speaking with the correct person using two identifiers. Kristina Huang, [birth year]. she has verbally consented to this visit. All issues noted in this document were discussed and addressed. The format was not optimal for physical exam.   Subjective:    Patient ID: Kristina Huang, female    DOB: [birth year],  PCP Abran Richard, MD   Past Medical History:  Diagnosis Date  . Acid reflux   . Arthritis   . Cancer ([place])    Thyroid  . Diabetes mellitus without complication (HCC)    fasting 100-130  . Hypercholesterolemia   . Hypertension   . 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 BACK SURGERY    . BUNIONECTOMY Bilateral   . CHOLECYSTECTOMY     Dr. Irving Shows  . COLONOSCOPY  2009   Dr. Oneida Alar: moderate internal hemorrhoids, frequent sigmoid colon diverticula, polyps X 3, one large adenoma otherwise hyperplastic  . COLONOSCOPY N/A 09/09/2014   Procedure: COLONOSCOPY;  Surgeon: Danie Binder, MD;  [facility];  Service: Endoscopy;  Laterality: N/A;  200 - moved to 1/29 @ 12:45  . COLONOSCOPY N/A 10/24/2017   Procedure: COLONOSCOPY;  Surgeon: Danie Binder, MD;  [facility];  Service: Endoscopy;  Laterality: N/A;  9:30  . ESOPHAGOGASTRODUODENOSCOPY N/A 09/09/2014   Procedure: ESOPHAGOGASTRODUODENOSCOPY (EGD);  Surgeon: Danie Binder, MD;  [facility];  Service: Endoscopy;  Laterality: N/A;  . EYE SURGERY Right   . JOINT REPLACEMENT    . LAMINECTOMY  1997  . MAXIMUM ACCESS (MAS)POSTERIOR LUMBAR INTERBODY FUSION (PLIF) 2 LEVEL  11/2013   POSTERIOR LUMBAR INTERBODY FUSION POSTERIOR LATERAL ARTHRODESIS POSTERIOR NONSEGMENTAL INSTRUMENTATION   LUMBAR TWO-THREE ,LUMBAR THREE-FOUR WITH HARDWARE REMOVAL LUMBAR FOUR-FIVE/notes 11/17/2013  . THYROIDECTOMY N/A 02/05/2017   Procedure: THYROIDECTOMY;  Surgeon: Aviva Signs, MD;  Location: [facility];  Service: General;  Laterality: N/A;  . TOTAL HIP ARTHROPLASTY Left 07/21/2018  . TOTAL HIP ARTHROPLASTY Left 07/21/2018   Procedure: LEFT TOTAL HIP ARTHROPLASTY ANTERIOR APPROACH;  Surgeon: Mcarthur Rossetti, MD;  Location: [place];  Service: Orthopedics;  Laterality: Left;  . TOTAL HIP ARTHROPLASTY Right 05/04/2019   Procedure: RIGHT TOTAL HIP ARTHROPLASTY ANTERIOR APPROACH;  Surgeon: Mcarthur Rossetti, MD;  Location: [place];  Service: Orthopedics;  Laterality: Right;  . TUBAL LIGATION     Social History   Socioeconomic History  . Marital status: Divorced    Spouse name: Not on file  . Number of children: Not on file  . Years of education: Not on file  . Highest education level: Not on file  Occupational History  . Not on file  Tobacco Use  . Smoking status: Former Smoker    Packs/day: 0.25    Years: 2.00    Pack years: 0.50    Types: Cigarettes    Quit date: 01/31/2010  Years since quitting: 9.7  . Smokeless tobacco: Never Used  Substance and Sexual Activity  . Alcohol use: No    Alcohol/week: 0.0 standard drinks  . Drug use: No  . Sexual activity: Not Currently    Birth control/protection: Surgical  Other Topics Concern  . Not on file  Social History Narrative  . Not on file   Social Determinants of Health   Financial Resource Strain:   . Difficulty of Paying Living Expenses:   Food Insecurity:   . Worried About Charity fundraiser in the Last Year:   . Arboriculturist in the Last Year:   Transportation Needs:   . Film/video editor (Medical):   Marland Kitchen Lack of Transportation (Non-Medical):   Physical Activity:   . Days of Exercise per Week:   . Minutes of Exercise per Session:   Stress:   . Feeling of Stress :   Social Connections:   . Frequency of  Communication with Friends and Family:   . Frequency of Social Gatherings with Friends and Family:   . Attends Religious Services:   . Active Member of Clubs or Organizations:   . Attends Archivist Meetings:   Marland Kitchen Marital Status:    Outpatient Encounter Medications as of 11/15/2019  Medication Sig  . ACCU-CHEK AVIVA PLUS test strip   . ACCU-CHEK SOFTCLIX LANCETS lancets   . acetaminophen (TYLENOL) 500 MG tablet Take 1,000 mg by mouth every 8 (eight) hours as needed for moderate pain or headache.   Marland Kitchen aspirin 81 MG chewable tablet Chew 1 tablet (81 mg total) by mouth 2 (two) times daily.  . bisacodyl (DULCOLAX) 10 MG suppository Place 1 suppository (10 mg total) rectally daily as needed for moderate constipation.  . EUTHYROX 112 MCG tablet TAKE 1 TABLET BY MOUTH ONCE DAILY BEFORE BREAKFAST  . HYDROcodone-acetaminophen (NORCO) 7.5-325 MG tablet Take 1 tablet by mouth every 6 (six) hours as needed for moderate pain.  Marland Kitchen losartan (COZAAR) 50 MG tablet Take 50 mg by mouth daily.   . metFORMIN (GLUCOPHAGE) 500 MG tablet Take 500 mg by mouth daily with breakfast.  . methocarbamol (ROBAXIN) 500 MG tablet Take 1 tablet (500 mg total) by mouth every 6 (six) hours as needed for muscle spasms.  Marland Kitchen omeprazole (PRILOSEC) 20 MG capsule Take 20 mg by mouth daily.   No facility-administered encounter medications on file as of 11/15/2019.   ALLERGIES: No Known Allergies  VACCINATION STATUS: Immunization History  Administered Date(s) Administered  . Fluad Quad(high Dose 65+) 05/06/2019  . Pneumococcal Polysaccharide-23 05/06/2019    HPI 74 year old female patient with medical history as above.  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  - She underwent total thyroidectomy on 02/05/2017 which revealed a  papillary thyroid carcinoma on the right lower thyroid-0.5 cm foci  with local extrathyroidal extension and another 0.2 cm separate tumor in the right lobe. The left lobe was said to be consistent with hyperplastic nodule. - She underwent I-131 thyroid remnant ablation on 04/15/2017 followed by post therapy whole body scan. -  On the scan, she was found to have foci of abnormal radioiodine accumulation in the cervical region, suggestive of thyroid remnant or metastatic cervical lymph node.  -Her previsit thyroid/neck ultrasound confirms decrease in size of this lymph node to 0.3 cm and no new concerns.  -She denies dysphagia, shortness of breath, nor voice change.  Her most recent thyroid/neck ultrasound is negative for any residual thyroid tissue nor cervical adenopathy. -She is currently on levothyroxine 112 mcg p.o. daily before breakfast.  she reports compliance to this medication.  - She denies any family history of thyroid cancer, however one of her sisters have hyperthyroidism status post treatment with what appears to be radioactive iodine therapy.    Objective:    There were no vitals taken for this visit.  Wt Readings from Last 3 Encounters:  05/04/19 154 lb (69.9 kg)  04/30/19 154 lb 8 oz (70.1 kg)  03/09/19 150 lb (68 kg)       CMP ( most recent) CMP     Component Value Date/Time   NA 137 05/07/2019 0355   K 3.3 (L) 05/07/2019 0355   CL 104 05/07/2019 0355   CO2 26 05/07/2019 0355   GLUCOSE 128 (H) 05/07/2019 0355   BUN 8 05/07/2019 0355   CREATININE 0.59 05/07/2019 0355   CREATININE 0.80 03/26/2018 1041   CALCIUM 8.3 (L) 05/07/2019 0355   PROT 7.2 03/26/2018 1041   ALBUMIN 3.2 (L) 02/06/2017 0444   AST 16 03/26/2018 1041   ALT 15 03/26/2018 1041   ALKPHOS 68 02/06/2017 0444   BILITOT 0.4 03/26/2018 1041   GFRNONAA >60 05/07/2019 0355   GFRNONAA 74 03/26/2018 1041   GFRAA >60 05/07/2019 0355   GFRAA 85 03/26/2018 1041        Fine-needle aspiration cytology of the thyroid on 10/23/2016 showed: THYROID, FINE NEEDLE  ASPIRATION, LEFT (SPECIMEN 1 OF 1, COLLECTED 10/23/16): CONSISTENT WITH BENIGN FOLLICULAR NODULE (BETHESDA CATEGORY II).  02/05/2017: She underwent total thyroidectomy: 02/05/2017: INAL DIAGNOSIS Diagnosis 1. Thyroid, lobectomy, left - NODULAR HYPERPLASIA (MULTINODULAR GOITER). 2. Thyroid, lobectomy, right - PAPILLARY THYROID CARCINOMA, TWO TUMORS, 0.5 AND 0.2 CM. - PAPILLARY CARCINOMA FOCALLY EXTENDS INTO THE PERITHYROIDAL CONNECTIVE TISSUE. - HURTHLE CELL ADENOMATOUS NODULE. - NODULAR HYPERPLASIA.  TNM code: pT1a , pNX.   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  No distant sites of iodine-avid metastatic thyroid cancer Identified.  Surveillance thyroid/neck ultrasound from 05/19/2017 showed:  Surgically absent thyroid lobes. A single morphologically unremarkable 0.8 cm left cervical lymph node is noted. No pathologic adenopathy.  IMPRESSION: No evidence of residual/ recurrent tissue post thyroidectomy   Thyroid/neck ultrasound on February 23, 2018 IMPRESSION: 1. Post total thyroidectomy without evidence of residual nodular tissue within the thyroidectomy resection bed. 2. Solitary left-sided cervical lymph node is not enlarged by size criteria measuring 0.3 cm in diameter. No definitive bulky regional cervical lymphadenopathy.   Results for RINDY, REAUME (MRN [id number]) as of 05/14/2019 12:40  Ref. Range 11/04/2018 12:01 04/30/2019 15:10  TSH Latest Ref Range: 0.40 - 4.50 Huang/L 0.20 (L) 0.69  T4,Free(Direct) Latest Ref Range: 0.8 - 1.8 ng/dL 1.5 1.5  Thyroglobulin Latest Units: ng/mL <0.1 (L) 0.1 (L)  Thyroglobulin Ab Latest Ref Range: < or = 1 IU/mL <1 <1   Thyroid/neck ultrasound on April 28, 2019 No discrete nodules are seen within the thyroidectomy bed. No regional adenopathy identified.   IMPRESSION: 1. No residual/recurrent tissue  post thyroidectomy. No adenopathy identified.      Assessment & Plan:   1. 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 Her surgical sample unexpectedly revealed occult malignancy on  the right lobe of the thyroid, 0.5 cm  with focal extrathyroidal extension and 0.2 cm foci.  TNM code: pT1a , pNX. - She is status post total thyroidectomy and I-131 thyroid remnant ablation on 04/15/2017.  - Thyroglobulin level and thyroglobulin antibodies are  low  on the day of or body scan. 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  No distant sites of iodine-avid metastatic thyroid cancer Identified.  - Surveillance  thyroid/neck ultrasound - is unremarkable except for nonspecific 0.8 cm lymph node on the left cervical region.   Subsequent thyroid/neck ultrasound on February 23, 2018 showed decreasing size of his nonspecific lymph node to 0.3 cm. -Her most recent thyroid/neck ultrasound is negative for residual thyroid tissue nor thyroid lymphadenopathy.    -She is being considered for Thyrogen stimulated whole-body scan before her next visit in 6 months.   2. Postsurgical hypothyroidism  -Her thyroid function tests are consistent with appropriate replacement with appropriately suppressed TSH.   She is advised to continue levothyroxine 112 mcg p.o. daily before breakfast.     - The goal of therapy in thyroid cancer suppressive therapy is to keep TSH between 0.1-0.5 .   - We discussed about the correct intake of her thyroid hormone, on empty stomach at fasting, with water, separated by at least 30 minutes from breakfast and other medications,  and separated by more than 4 hours from calcium, iron, multivitamins, acid reflux medications (PPIs). -Patient is made aware of  the fact that thyroid hormone replacement is needed for life, dose to be adjusted by periodic monitoring of thyroid function tests.   - Patient Care Time Today:  25 min, of which >50% was spent in  counseling and the rest reviewing her  current and  previous labs/studies, previous treatments,  and medications' doses and developing a plan for long-term care based on the latest recommendations for standards of care.   Kristina Huang participated in the discussions, expressed understanding, and voiced agreement with the above plans.  All questions were answered to her satisfaction. she is encouraged to contact clinic should she have any questions or concerns prior to her return visit.  - I advised patient to maintain close follow up with Abran Richard, MD for primary care needs.  Follow up plan: Return in about 6 months (around 05/16/2020) for Follow up with Whole Body Scan w/Thyrogen, Follow up with Pre-visit Labs.  Glade Lloyd, MD Phone: [phone]  Fax: [phone]  This note was partially dictated with voice recognition software. Similar sounding words can be transcribed inadequately or may not  be corrected upon review.  11/15/2019, 9:22 PM

## 2019-11-22 ENCOUNTER — Telehealth: Payer: Self-pay | Admitting: Orthopaedic Surgery

## 2019-11-22 MED ORDER — TIZANIDINE HCL 4 MG PO TABS: 4.0000 mg | ORAL_TABLET | Freq: Two times a day (BID) | ORAL | 0 refills | Status: DC | PRN

## 2019-11-22 NOTE — Telephone Encounter (Signed)
Please advise 

## 2019-11-22 NOTE — Telephone Encounter (Signed)
Patient called requesting a muscle relaxer be sent to pharmacy before appointment. Patient states to be in a lot of pain. Please [facility] in [place]. Patient phone number is [phone].

## 2019-11-24 ENCOUNTER — Other Ambulatory Visit: Payer: Self-pay

## 2019-11-24 ENCOUNTER — Ambulatory Visit (INDEPENDENT_AMBULATORY_CARE_PROVIDER_SITE_OTHER): Payer: Medicare PPO

## 2019-11-24 ENCOUNTER — Ambulatory Visit: Payer: Medicare PPO | Admitting: Orthopaedic Surgery

## 2019-11-24 VITALS — Ht 63.0 in | Wt 154.0 lb

## 2019-11-24 DIAGNOSIS — G8929 Other chronic pain: Secondary | ICD-10-CM

## 2019-11-24 DIAGNOSIS — M5442 Lumbago with sciatica, left side: Secondary | ICD-10-CM

## 2019-11-24 DIAGNOSIS — Z96642 Presence of left artificial hip joint: Secondary | ICD-10-CM

## 2019-11-24 NOTE — Progress Notes (Signed)
Office Visit Note   Patient: Kristina Huang           Date of Birth: [birth year]           MRN: [id number] Visit Date: 11/24/2019              Requested by: Abran Richard, MD [address] [place],  [place] [address] PCP: Abran Richard, MD   Assessment & Plan: Visit Diagnoses:  1. History of left hip replacement   2. Chronic left-sided low back pain with left-sided sciatica     Plan: I gave her reassurance that her hip replacements look good and I do not see this being a hip issue at all.  I do feel that she would benefit from physical therapy on her lumbar spine and any modalities they can do for the sciatic region on the left side such as e-stim MRI and a pheresis and phonophoresis or even dry needling.  If this becomes a persistent problem she does need to follow-up with her neurosurgeon.  I can always see her back in 4 weeks to see how she is done with therapy.  All questions and concerns were answered and addressed.  Follow-Up Instructions: Return in about 4 weeks (around 12/22/2019).   Orders:  Orders Placed This Encounter  Procedures  . XR HIP UNILAT W OR W/O PELVIS 1V LEFT   No orders of the defined types were placed in this encounter.     Procedures: No procedures performed   Clinical Data: No additional findings.   Subjective: Chief Complaint  Patient presents with  . Left Hip - Pain  The patient comes in today with left hip pain.  She does have a history of bilateral total hip arthroplasties but when she points to her hip it is really her low back to the left side a source of her pain.  She has had quite extensive lumbar spine surgery as well.  She does report radicular symptoms that goes down to her foot.  She denies any pain on the side of her hip and says it hurts when she lays on the side but again she points to the posterior pelvis and not her hip as a source of her pain.  She is a diabetic but reports good control.  Her spine surgery was done in the  [facility] system by Dr. Christella Noa several years ago.  She is 74 years old and does ambulate with a cane.  HPI  Review of Systems She currently denies any headache, chest pain, shortness of breath, fever, chills, nausea, vomiting  Objective: Vital Signs: Ht 5\' 3"  (1.6 m)   Wt 154 lb (69.9 kg)   BMI 27.28 kg/m   Physical Exam She is alert and orient x3 and in no acute distress.  She does get up on the exam table easily. Ortho Exam Examination of both hips shows fluid and full range of motion of both hips with no pain in the groin or around the hip.  There is no pain to palpation along the trochanteric area.  There is no pain to palpation of the lumbar spine or the left side of her pelvis. Specialty Comments:  No specialty comments available.  Imaging: XR HIP UNILAT W OR W/O PELVIS 1V LEFT  Result Date: 11/24/2019 A low AP pelvis and lateral of the left hip shows bilateral total hip arthroplasties with no complicating features.  There is no evidence of loosening.  The lower lumbar spine can be seen  and there is evidence of hardware from previous spine surgery.    PMFS History: Patient Active Problem List   Diagnosis Date Noted  . Status post total replacement of right hip 05/04/2019  . Unilateral primary osteoarthritis, right hip 03/09/2019  . Vitamin D deficiency 08/13/2018  . Prediabetes 08/13/2018  . Unilateral primary osteoarthritis, left hip 07/21/2018  . Status post total replacement of left hip 07/21/2018  . Malignant neoplasm of thyroid gland ([place]) 03/18/2017  . 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 Multinodular goiter 12/10/2016  . Personal history of colonic polyps 08/23/2014  . GERD (gastroesophageal reflux disease) 08/23/2014  . Chronic diarrhea 08/23/2014  . Spondylolisthesis at L3-L4 level 11/17/2013  . COLONIC POLYPS, HYPERPLASTIC 02/03/2009  . HYPERLIPIDEMIA 02/03/2009  . HYPERTENSION 02/03/2009  . GERD 02/03/2009  . ARTHRITIS 02/03/2009   Past Medical  History:  Diagnosis Date  . Acid reflux   . Arthritis   . Cancer ([place])    Thyroid  . Diabetes mellitus without complication (HCC)    fasting 100-130  . Hypercholesterolemia   . Hypertension   . Thyroid goiter    right tracheal deviation by CXR 10/2013    Family History  Problem Relation Age of Onset  . Heart disease Mother   . Heart disease Father   . Colon cancer Neg Hx     Past Surgical History:  Procedure Laterality Date  . BACK SURGERY    . BUNIONECTOMY Bilateral   . CHOLECYSTECTOMY     Dr. Irving Shows  . COLONOSCOPY  2009   Dr. Oneida Alar: moderate internal hemorrhoids, frequent sigmoid colon diverticula, polyps X 3, one large adenoma otherwise hyperplastic  . COLONOSCOPY N/A 09/09/2014   Procedure: COLONOSCOPY;  Surgeon: Danie Binder, MD;  [facility];  Service: Endoscopy;  Laterality: N/A;  200 - moved to 1/29 @ 12:45  . COLONOSCOPY N/A 10/24/2017   Procedure: COLONOSCOPY;  Surgeon: Danie Binder, MD;  [facility];  Service: Endoscopy;  Laterality: N/A;  9:30  . ESOPHAGOGASTRODUODENOSCOPY N/A 09/09/2014   Procedure: ESOPHAGOGASTRODUODENOSCOPY (EGD);  Surgeon: Danie Binder, MD;  [facility];  Service: Endoscopy;  Laterality: N/A;  . EYE SURGERY Right   . JOINT REPLACEMENT    . LAMINECTOMY  1997  . MAXIMUM ACCESS (MAS)POSTERIOR LUMBAR INTERBODY FUSION (PLIF) 2 LEVEL  11/2013   POSTERIOR LUMBAR INTERBODY FUSION POSTERIOR LATERAL ARTHRODESIS POSTERIOR NONSEGMENTAL INSTRUMENTATION  LUMBAR TWO-THREE ,LUMBAR THREE-FOUR WITH HARDWARE REMOVAL LUMBAR FOUR-FIVE/notes 11/17/2013  . THYROIDECTOMY N/A 02/05/2017   Procedure: THYROIDECTOMY;  Surgeon: Aviva Signs, MD;  Location: [facility];  Service: General;  Laterality: N/A;  . TOTAL HIP ARTHROPLASTY Left 07/21/2018  . TOTAL HIP ARTHROPLASTY Left 07/21/2018   Procedure: LEFT TOTAL HIP ARTHROPLASTY ANTERIOR APPROACH;  Surgeon: Mcarthur Rossetti, MD;  Location: [place];  Service: Orthopedics;   Laterality: Left;  . TOTAL HIP ARTHROPLASTY Right 05/04/2019   Procedure: RIGHT TOTAL HIP ARTHROPLASTY ANTERIOR APPROACH;  Surgeon: Mcarthur Rossetti, MD;  Location: [place];  Service: Orthopedics;  Laterality: Right;  . TUBAL LIGATION     Social History   Occupational History  . Not on file  Tobacco Use  . Smoking status: Former Smoker    Packs/day: 0.25    Years: 2.00    Pack years: 0.50    Types: Cigarettes    Quit date: 01/31/2010    Years since quitting: 9.8  . Smokeless tobacco: Never Used  Substance and Sexual Activity  . Alcohol use: No  Alcohol/week: 0.0 standard drinks  . Drug use: No  . Sexual activity: Not Currently    Birth control/protection: Surgical

## 2019-11-26 ENCOUNTER — Ambulatory Visit (HOSPITAL_COMMUNITY): Payer: Medicare PPO | Admitting: Physical Therapy

## 2019-11-30 ENCOUNTER — Other Ambulatory Visit: Payer: Self-pay

## 2019-11-30 ENCOUNTER — Encounter (HOSPITAL_COMMUNITY): Payer: Self-pay

## 2019-11-30 ENCOUNTER — Ambulatory Visit (HOSPITAL_COMMUNITY): Payer: Medicare PPO | Attending: Orthopaedic Surgery

## 2019-11-30 DIAGNOSIS — G8929 Other chronic pain: Secondary | ICD-10-CM | POA: Insufficient documentation

## 2019-11-30 DIAGNOSIS — R29898 Other symptoms and signs involving the musculoskeletal system: Secondary | ICD-10-CM | POA: Insufficient documentation

## 2019-11-30 DIAGNOSIS — R262 Difficulty in walking, not elsewhere classified: Secondary | ICD-10-CM | POA: Diagnosis present

## 2019-11-30 DIAGNOSIS — M6281 Muscle weakness (generalized): Secondary | ICD-10-CM | POA: Insufficient documentation

## 2019-11-30 DIAGNOSIS — M545 Low back pain, unspecified: Secondary | ICD-10-CM

## 2019-11-30 NOTE — Therapy (Signed)
[place], [place], [address] Phone: [phone]   Fax:  [phone]  Physical Therapy Evaluation  Patient Details  Name: Kristina Huang MRN: [id number] Date of Birth: 03-31-1946 Referring Provider (PT): Mcarthur Rossetti, MD   Encounter Date: 11/30/2019  PT End of Session - 11/30/19 0810    Visit Number  1    Number of Visits  12    Date for PT Re-Evaluation  01/14/20    Authorization Type  [employer] ([place] every 5 visits)    Authorization Time Period  11/30/19 to 01/14/20    Progress Note Due on Visit  10    PT Start Time  0815    PT Stop Time  0900    PT Time Calculation (min)  45 min    Activity Tolerance  Patient tolerated treatment well;Patient limited by pain    Behavior During Therapy  [facility] for tasks assessed/performed       Past Medical History:  Diagnosis Date  . Acid reflux   . Arthritis   . Cancer ([place])    Thyroid  . Diabetes mellitus without complication (HCC)    fasting 100-130  . Hypercholesterolemia   . Hypertension   . 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 BACK SURGERY    . BUNIONECTOMY Bilateral   . CHOLECYSTECTOMY     Dr. Irving Shows  . COLONOSCOPY  2009   Dr. Oneida Alar: moderate internal hemorrhoids, frequent sigmoid colon diverticula, polyps X 3, one large adenoma otherwise hyperplastic  . COLONOSCOPY N/A 09/09/2014   Procedure: COLONOSCOPY;  Surgeon: Danie Binder, MD;  [facility];  Service: Endoscopy;  Laterality: N/A;  200 - moved to 1/29 @ 12:45  . COLONOSCOPY N/A 10/24/2017   Procedure: COLONOSCOPY;  Surgeon: Danie Binder, MD;  [facility];  Service: Endoscopy;  Laterality: N/A;  9:30  . ESOPHAGOGASTRODUODENOSCOPY N/A 09/09/2014   Procedure: ESOPHAGOGASTRODUODENOSCOPY (EGD);  Surgeon: Danie Binder, MD;  [facility];  Service: Endoscopy;  Laterality:  N/A;  . EYE SURGERY Right   . JOINT REPLACEMENT    . LAMINECTOMY  1997  . MAXIMUM ACCESS (MAS)POSTERIOR LUMBAR INTERBODY FUSION (PLIF) 2 LEVEL  11/2013   POSTERIOR LUMBAR INTERBODY FUSION POSTERIOR LATERAL ARTHRODESIS POSTERIOR NONSEGMENTAL INSTRUMENTATION  LUMBAR TWO-THREE ,LUMBAR THREE-FOUR WITH HARDWARE REMOVAL LUMBAR FOUR-FIVE/notes 11/17/2013  . THYROIDECTOMY N/A 02/05/2017   Procedure: THYROIDECTOMY;  Surgeon: Aviva Signs, MD;  Location: [facility];  Service: General;  Laterality: N/A;  . TOTAL HIP ARTHROPLASTY Left 07/21/2018  . TOTAL HIP ARTHROPLASTY Left 07/21/2018   Procedure: LEFT TOTAL HIP ARTHROPLASTY ANTERIOR APPROACH;  Surgeon: Mcarthur Rossetti, MD;  Location: [facility];  Service: Orthopedics;  Laterality: Left;  . TOTAL HIP ARTHROPLASTY Right 05/04/2019   Procedure: RIGHT TOTAL HIP ARTHROPLASTY ANTERIOR APPROACH;  Surgeon: Mcarthur Rossetti, MD;  Location: [facility];  Service: Orthopedics;  Laterality: Right;  . TUBAL LIGATION      There were no vitals filed for this visit.   Subjective Assessment - 11/30/19 0809    Subjective  Pt reports pain in low back from her muscles causing intense pain going down her legs. Pt reports pain does down her lateral thighs and started about 6 weeks ago without a fall or injury. Pt reports using a tight abdominal corset and using SPC now due to the pain. Pt  reports difficulty with bed mobility and increased pain with standing up from sitting. Pt reports numbness/tingling down LLE always. Pt reports lying down used to give her relief, but no longer is giving her any. Pt reports urgency with bladder, but no incontinence; denies saddle parasthesias; denies recent falls. Pt reports coughing is the worst pain and causes pressure all over her legs. Pt reports bending forward helps to take the pressure and pain off her back.    Pertinent History  L THA 07/2018, R THA 04/2019, Post lumbar interbody fusion (PLIF) 11/2013, HTN, diabetes, thyroid CA,  arthritis    Limitations  House hold activities    How long can you sit comfortably?  no issues - increased stiffness with prolonged sitting    How long can you stand comfortably?  10 minutes    How long can you walk comfortably?  eases as the day goes on    Diagnostic tests  x-ray: THA and lumbar spine hardware noted without issues    Patient Stated Goals  get painfree without being cut on    Currently in Pain?  Yes    Pain Score  10-Worst pain ever    Pain Location  Back    Pain Orientation  Right;Left;Lower    Pain Descriptors / Indicators  Aching    Pain Type  Chronic pain    Pain Radiating Towards  down bil lateral thighs    Pain Onset  More than a month ago    Pain Frequency  Constant    Aggravating Factors   moving around in bed    Pain Relieving Factors  nothing    Effect of Pain on Daily Activities  limited         [facility] PT Assessment - 11/30/19 0001      Assessment   Medical Diagnosis  L side LBP with L sciatica    Referring Provider (PT)  Mcarthur Rossetti, MD    Onset Date/Surgical Date  --   approximately 6 weeks ago   Hand Dominance  Right    Next MD Visit  none scheduled    Prior Therapy  Yes, after THA with good results      Precautions   Precautions  None    Required Braces or Orthoses  --   wears corset for comfort     Restrictions   Weight Bearing Restrictions  No      Balance Screen   Has the patient fallen in the past 6 months  No    Has the patient had a decrease in activity level because of a fear of falling?   No    Is the patient reluctant to leave their home because of a fear of falling?   No      Prior Function   Level of Independence  Independent    Leisure  walking      Cognition   Overall Cognitive Status  Within Functional Limits for tasks assessed      Observation/Other Assessments   Focus on Therapeutic Outcomes (FOTO)   52% limited      Sensation   Light Touch  Appears Intact   numb tips of R fingers, L lat leg to toes  at night     Functional Tests   Functional tests  Sit to Stand      Sit to Stand   Comments  5x STS: limited due to sharp with rising from sitting, able to complete 4 reps with increased time  ROM / Strength   AROM / PROM / Strength  AROM;Strength      AROM   Overall AROM Comments  all lumbar mobility limtied and painful    AROM Assessment Site  Lumbar    Lumbar Flexion  50% limited    Lumbar Extension  50% limited    Lumbar - Right Side Bend  50% limited    Lumbar - Left Side Bend  50% limited    Lumbar - Right Rotation  50% limited    Lumbar - Left Rotation  50% limited      Strength   Strength Assessment Site  Hip;Knee;Ankle    Right Hip Flexion  3+/5    Right Hip Extension  3/5    Right Hip ABduction  4+/5    Left Hip Flexion  3+/5    Left Hip Extension  3+/5    Left Hip ABduction  4+/5    Right Knee Flexion  4/5    Right Knee Extension  5/5    Left Knee Flexion  4/5    Left Knee Extension  5/5    Right Ankle Dorsiflexion  4+/5    Left Ankle Dorsiflexion  4+/5      Palpation   Spinal mobility  unable to recreate pt's pain with gentle PAs to lumbar vertebrae    SI assessment   denies tenderness bilaterally    Palpation comment  unable to recreate pt's pain with palpation to lumbar paraspinals and bil glutes/piriformis      Special Tests    Special Tests  Lumbar    Lumbar Tests  Slump Test      Slump test   Findings  Negative    Comment  bil      Ambulation/Gait   Ambulation/Gait  Yes    Ambulation/Gait Assistance  6: Modified independent (Device/Increase time)    Assistive device  Straight cane    Gait Comments  Pt ambalutes with short, shuffling steps, flexed trunk with flexed knees, using SPC to steady self      Balance   Balance Assessed  --   no concerns at this time         Objective measurements completed on examination: See above findings.       PT Education - 11/30/19 1239    Education Details  Assessment findings, POC, FOTO,  initaited HEP, log rolling with supine<>sit transfer to reduce pain, spinal anatomy with lumbar plexus and nerves versus piriformis and radiating pain from sciatic nerve.    Person(s) Educated  Patient    Methods  Explanation;Demonstration;Verbal cues;Handout    Comprehension  Verbalized understanding;Returned demonstration;Verbal cues required       PT Short Term Goals - 11/30/19 1246      PT SHORT TERM GOAL #1   Title  Pt will perform HEP consistently and correctly to improve mobility and reduce pain with ambulation and standing.    Time  3    Period  Weeks    Status  New    Target Date  12/21/19      PT SHORT TERM GOAL #2   Title  Pt will perform 5x STS without increased pain in 30 sec or less to allow pt to stand and rise from seated surface to move about the home.    Time  3    Period  Weeks    Status  New        PT Long Term Goals - 11/30/19 1249  PT LONG TERM GOAL #1   Title  Pt will demo 4+/5 hip and knee strength bil to improve standing tolerance and ambulation tolerance to return to regular walking program.    Time  6    Period  Weeks    Status  New    Target Date  01/11/20      PT LONG TERM GOAL #2   Title  Pt will improve FOTO to 25% limited to indicate improvement in self perceived limitations with daily activities.    Time  6    Period  Weeks    Status  New      PT LONG TERM GOAL #3   Title  Pt will ambulate at 0.40m/s in 3MWT with LRAD to decrease risk for falls when ambulating in the community.    Time  6    Period  Weeks    Status  New      PT LONG TERM GOAL #4   Title  Pt will self report no lumbar corset use to indicate improvement in core strength and reduced back pain with regular daily activities.    Time  6    Period  Weeks    Status  New             Plan - 11/30/19 1240    Clinical Impression Statement  Pt is a pleasant 74YO female with bil LBP radiating down bil lateral LE. Pt with THA in both hips, one in 2019 and one in 2020,  but x-rays show good alignment of artificial hip joints and no concerns of loosening. Pt with negative slump test. Pt with significantly increased pain with rising from seated chair yelling out in pain, but not with every rep. Pt with increased difficulty performing supine<>sit and educated on log rolling to minimize pain and reducing demand on core by rolling to side. Therapist unable to recreate pt's pain with palpation but does agree that pain is at lumbar paraspinals radiating down along buttock into lateral thighs when therapist palpates. Pt with very high pain, poor tolerance to prone, and frequently readjusting sitting posture limiting therapists ability to palpate. Pt wearing personal corset to decrease low back pain symptoms, but with minimal improvement. Pt limited with mobility, limited and painful AROM, decreased strength, abnormal gait pattern, dependence on AD, and increased pain with mobility. Pt would benefit from skilled PT interventions to improve deficits noted and establish HEP to improve mobility with reduced pain.    Personal Factors and Comorbidities  Comorbidity 2;Past/Current Experience    Comorbidities  HTN, arthritis, thyroid CA, diabetes    Examination-Activity Limitations  Bed Mobility;Lift;Locomotion Level;Sit;Sleep;Stand;Transfers    Examination-Participation Restrictions  Cleaning;Community Activity;Shop    Stability/Clinical Decision Making  Evolving/Moderate complexity    Clinical Decision Making  Moderate    Rehab Potential  Fair    PT Frequency  2x / week    PT Duration  6 weeks    PT Treatment/Interventions  ADLs/Self Care Home Management;Aquatic Therapy;Cryotherapy;Moist Heat;Traction;DME Instruction;Gait training;Stair training;Functional mobility training;Therapeutic activities;Therapeutic exercise;Balance training;Neuromuscular re-education;Patient/family education;Manual techniques;Passive range of motion;Dry needling;Joint Manipulations    PT Next Visit Plan   Review HEP, goals, 3MWT. If pain continues to be high with limited participation- refer back to physician for further imaging. Begin core strengthening, glute strengthening and continue assessing pain location. Use manual and modalities for pain relief as needed. f/u with cancer scan.    PT Home Exercise Plan  Eval: SKTC, piriformis leg cross to opposite shoulder, supine figure  4 stretch, LTR    Consulted and Agree with Plan of Care  Patient       Patient will benefit from skilled therapeutic intervention in order to improve the following deficits and impairments:  Abnormal gait, Cardiopulmonary status limiting activity, Decreased activity tolerance, Decreased endurance, Decreased mobility, Decreased strength, Hypomobility, Increased muscle spasms, Impaired perceived functional ability, Impaired flexibility, Impaired sensation, Improper body mechanics, Pain  Visit Diagnosis: Chronic bilateral low back pain without sciatica  Difficulty in walking, not elsewhere classified  Muscle weakness (generalized)  Other symptoms and signs involving the musculoskeletal system     Problem List Patient Active Problem List   Diagnosis Date Noted  . Status post total replacement of right hip 05/04/2019  . Unilateral primary osteoarthritis, right hip 03/09/2019  . Vitamin D deficiency 08/13/2018  . Prediabetes 08/13/2018  . Unilateral primary osteoarthritis, left hip 07/21/2018  . Status post total replacement of left hip 07/21/2018  . Malignant neoplasm of thyroid gland ([place]) 03/18/2017  . 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 Multinodular goiter 12/10/2016  . Personal history of colonic polyps 08/23/2014  . GERD (gastroesophageal reflux disease) 08/23/2014  . Chronic diarrhea 08/23/2014  . Spondylolisthesis at L3-L4 level 11/17/2013  . COLONIC POLYPS, HYPERPLASTIC 02/03/2009  . HYPERLIPIDEMIA 02/03/2009  . HYPERTENSION 02/03/2009  . GERD 02/03/2009  . ARTHRITIS 02/03/2009     Talbot Grumbling PT, DPT 11/30/19, 2:07 PM [place], [place], [address] Phone: [phone]   Fax:  [phone]  Name: Kristina Huang MRN: [id number] Date of Birth: 08/25/1945

## 2019-11-30 NOTE — Patient Instructions (Signed)
Exercises Hook Lying Single Knee to Chest Stretch with Towel - 1 x daily - 7 x weekly - 3 sets - 10 reps - 3-5 seconds hold Supine Figure 4 Piriformis Stretch - 1 x daily - 7 x weekly - 10 reps - 3 sets Supine Piriformis Stretch with Leg Straight - 1 x daily - 7 x weekly - 10 reps - 3 sets Supine Lower Trunk Rotation - 1 x daily - 7 x weekly - 10 reps - 3 sets

## 2019-12-01 ENCOUNTER — Encounter (HOSPITAL_COMMUNITY): Payer: Self-pay

## 2019-12-01 ENCOUNTER — Encounter (HOSPITAL_COMMUNITY)
Admission: RE | Admit: 2019-12-01 | Discharge: 2019-12-01 | Disposition: A | Discharge status: Home / Self Care | Payer: Medicare PPO | Source: Ambulatory Visit | Attending: "Endocrinology | Admitting: "Endocrinology

## 2019-12-01 DIAGNOSIS — C73 Malignant neoplasm of thyroid gland: Secondary | ICD-10-CM | POA: Insufficient documentation

## 2019-12-01 MED ORDER — STERILE WATER FOR INJECTION IJ SOLN
INTRAMUSCULAR | Status: AC
  Filled 2019-12-01: qty 10

## 2019-12-01 MED ORDER — THYROTROPIN ALFA 1.1 MG IM SOLR
INTRAMUSCULAR | Status: AC
  Administered 2019-12-01: 0.9 mg via INTRAMUSCULAR
  Filled 2019-12-01: qty 0.9

## 2019-12-01 MED ORDER — THYROTROPIN ALFA 1.1 MG IM SOLR: 0.9000 mg | INTRAMUSCULAR | Status: AC

## 2019-12-02 ENCOUNTER — Other Ambulatory Visit: Payer: Self-pay

## 2019-12-02 ENCOUNTER — Encounter (HOSPITAL_COMMUNITY)
Admission: RE | Admit: 2019-12-02 | Discharge: 2019-12-02 | Disposition: A | Discharge status: Home / Self Care | Payer: Medicare PPO | Source: Ambulatory Visit | Attending: "Endocrinology | Admitting: "Endocrinology

## 2019-12-02 DIAGNOSIS — C73 Malignant neoplasm of thyroid gland: Secondary | ICD-10-CM

## 2019-12-02 MED ORDER — THYROTROPIN ALFA 1.1 MG IM SOLR
INTRAMUSCULAR | Status: AC
  Filled 2019-12-02: qty 0.9

## 2019-12-02 MED ORDER — THYROTROPIN ALFA 1.1 MG IM SOLR
0.9000 mg | INTRAMUSCULAR | Status: AC
  Administered 2019-12-02: 08:00:00 0.9 mg via INTRAMUSCULAR

## 2019-12-02 MED ORDER — STERILE WATER FOR INJECTION IJ SOLN
INTRAMUSCULAR | Status: AC
  Filled 2019-12-02: qty 10

## 2019-12-03 ENCOUNTER — Other Ambulatory Visit (HOSPITAL_COMMUNITY)
Admission: RE | Admit: 2019-12-03 | Discharge: 2019-12-03 | Disposition: A | Discharge status: Home / Self Care | Payer: Medicare PPO | Source: Ambulatory Visit | Attending: "Endocrinology | Admitting: "Endocrinology

## 2019-12-03 ENCOUNTER — Encounter (HOSPITAL_COMMUNITY)
Admission: RE | Admit: 2019-12-03 | Discharge: 2019-12-03 | Disposition: A | Discharge status: Home / Self Care | Payer: Medicare PPO | Source: Ambulatory Visit | Attending: "Endocrinology | Admitting: "Endocrinology

## 2019-12-03 ENCOUNTER — Encounter (HOSPITAL_COMMUNITY): Payer: Self-pay

## 2019-12-03 DIAGNOSIS — E890 Postprocedural hypothyroidism: Secondary | ICD-10-CM | POA: Diagnosis present

## 2019-12-03 DIAGNOSIS — C73 Malignant neoplasm of thyroid gland: Secondary | ICD-10-CM | POA: Diagnosis present

## 2019-12-03 LAB — TSH: TSH: 125.999 u[IU]/mL — ABNORMAL HIGH (ref 0.350–4.500)

## 2019-12-03 LAB — T4, FREE: Free T4: 1.2 ng/dL — ABNORMAL HIGH (ref 0.61–1.12)

## 2019-12-03 MED ORDER — SODIUM IODIDE I 131 CAPSULE
3.9200 | Freq: Once | INTRAVENOUS | Status: AC
  Administered 2019-12-03: 09:00:00 3.92 via ORAL

## 2019-12-06 ENCOUNTER — Encounter (HOSPITAL_COMMUNITY)
Admission: RE | Admit: 2019-12-06 | Discharge: 2019-12-06 | Disposition: A | Discharge status: Home / Self Care | Payer: Medicare PPO | Source: Ambulatory Visit | Attending: "Endocrinology | Admitting: "Endocrinology

## 2019-12-06 ENCOUNTER — Other Ambulatory Visit: Payer: Self-pay

## 2019-12-06 LAB — THYROGLOBULIN ANTIBODY: Thyroglobulin Antibody: <1 IU/mL (ref 0.0–0.9)

## 2019-12-12 LAB — THYROGLOBULIN LEVEL: Thyroglobulin: <2 ng/mL

## 2019-12-27 ENCOUNTER — Other Ambulatory Visit: Payer: Self-pay | Admitting: "Endocrinology

## 2020-01-24 ENCOUNTER — Telehealth (HOSPITAL_COMMUNITY): Payer: Self-pay

## 2020-01-24 NOTE — Telephone Encounter (Signed)
This pt never called back to schedule appts and when i tried to get her the [facility] was not set up.

## 2020-03-23 ENCOUNTER — Other Ambulatory Visit: Payer: Self-pay | Admitting: "Endocrinology

## 2020-05-11 LAB — THYROGLOBULIN LEVEL: Thyroglobulin: 0.1 ng/mL — ABNORMAL LOW

## 2020-05-11 LAB — TSH: TSH: 0.67 mIU/L (ref 0.40–4.50)

## 2020-05-11 LAB — THYROGLOBULIN ANTIBODY: Thyroglobulin Ab: <1 IU/mL (ref <=1)

## 2020-05-11 LAB — T4, FREE: Free T4: 1.7 ng/dL (ref 0.8–1.8)

## 2020-05-17 ENCOUNTER — Encounter: Payer: Self-pay | Admitting: "Endocrinology

## 2020-05-17 ENCOUNTER — Telehealth (INDEPENDENT_AMBULATORY_CARE_PROVIDER_SITE_OTHER): Payer: Medicare PPO | Admitting: "Endocrinology

## 2020-05-17 DIAGNOSIS — E890 Postprocedural hypothyroidism: Secondary | ICD-10-CM | POA: Diagnosis not present

## 2020-05-17 DIAGNOSIS — C73 Malignant neoplasm of thyroid gland: Secondary | ICD-10-CM

## 2020-05-17 NOTE — Progress Notes (Signed)
05/17/2020                                        Endocrinology Telehealth Visit Follow up Note -During COVID -19 Pandemic  This visit type was conducted  via telephone due to national recommendations for restrictions regarding the COVID-19 Pandemic  in an effort to limit this patient's exposure and mitigate transmission of the corona virus.   I connected with Kristina Huang on 05/17/2020   by telephone and verified that I am speaking with the correct person using two identifiers. Kristina Huang, September 29, 1945. she has verbally consented to this visit.  I was in my office and patient was in her residence. No other persons were with me during the encounter. All issues noted in this document were discussed and addressed. The format was not optimal for physical exam.   Subjective:    Patient ID: Kristina Huang, female    DOB: 1946-07-23, PCP Abran Richard, MD   Past Medical History:  Diagnosis Date  . Acid reflux   . Arthritis   . Cancer ([place])    Thyroid  . Diabetes mellitus without complication (HCC)    fasting 100-130  . Hypercholesterolemia   . Hypertension   . 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 BACK SURGERY    . BUNIONECTOMY Bilateral   . CHOLECYSTECTOMY     Dr. Irving Shows  . COLONOSCOPY  2009   Dr. Oneida Alar: moderate internal hemorrhoids, frequent sigmoid colon diverticula, polyps X 3, one large adenoma otherwise hyperplastic  . COLONOSCOPY N/A 09/09/2014   Procedure: COLONOSCOPY;  Surgeon: Danie Binder, MD;  [facility];  Service: Endoscopy;  Laterality: N/A;  200 - moved to 1/29 @ 12:45  . COLONOSCOPY N/A 10/24/2017   Procedure: COLONOSCOPY;  Surgeon: Danie Binder, MD;  [facility];  Service: Endoscopy;  Laterality: N/A;  9:30  . ESOPHAGOGASTRODUODENOSCOPY N/A 09/09/2014   Procedure: ESOPHAGOGASTRODUODENOSCOPY (EGD);  Surgeon: Danie Binder, MD;  [facility];  Service: Endoscopy;  Laterality: N/A;  . EYE SURGERY Right   . JOINT REPLACEMENT    . LAMINECTOMY  1997  . MAXIMUM ACCESS (MAS)POSTERIOR LUMBAR INTERBODY FUSION (PLIF) 2 LEVEL  11/2013   POSTERIOR LUMBAR INTERBODY FUSION POSTERIOR LATERAL ARTHRODESIS POSTERIOR NONSEGMENTAL INSTRUMENTATION  LUMBAR TWO-THREE ,LUMBAR THREE-FOUR WITH HARDWARE REMOVAL LUMBAR FOUR-FIVE/notes 11/17/2013  . THYROIDECTOMY N/A 02/05/2017   Procedure: THYROIDECTOMY;  Surgeon: Aviva Signs, MD;  Location: [facility];  Service: General;  Laterality: N/A;  . TOTAL HIP ARTHROPLASTY Left 07/21/2018  . TOTAL HIP ARTHROPLASTY Left 07/21/2018   Procedure: LEFT TOTAL HIP ARTHROPLASTY ANTERIOR APPROACH;  Surgeon: Mcarthur Rossetti, MD;  Location: [facility];  Service: Orthopedics;  Laterality: Left;  . TOTAL HIP ARTHROPLASTY Right 05/04/2019   Procedure: RIGHT TOTAL HIP ARTHROPLASTY ANTERIOR APPROACH;  Surgeon: Mcarthur Rossetti, MD;  Location: [facility];  Service: Orthopedics;  Laterality: Right;  . TUBAL LIGATION     Social History   Socioeconomic History  . Marital status: Divorced    Spouse name: Not on file  . Number of children: Not on file  .  Years of education: Not on file  . Highest education level: Not on file  Occupational History  . Not on file  Tobacco Use  . Smoking status: Former Smoker    Packs/day: 0.25    Years: 2.00    Pack years: 0.50    Types: Cigarettes    Quit date: 01/31/2010    Years since quitting: 10.2  . Smokeless tobacco: Never Used  Vaping Use  . Vaping Use: Never used  Substance and Sexual Activity  . Alcohol use: No    Alcohol/week: 0.0 standard drinks  . Drug use: No  . Sexual activity: Not Currently    Birth control/protection: Surgical  Other Topics Concern  . Not on file  Social History Narrative  . Not on file   Social Determinants of Health   Financial Resource Strain:   . Difficulty of Paying Living Expenses: Not on file  Food  Insecurity:   . Worried About Charity fundraiser in the Last Year: Not on file  . Ran Out of Food in the Last Year: Not on file  Transportation Needs:   . Lack of Transportation (Medical): Not on file  . Lack of Transportation (Non-Medical): Not on file  Physical Activity:   . Days of Exercise per Week: Not on file  . Minutes of Exercise per Session: Not on file  Stress:   . Feeling of Stress : Not on file  Social Connections:   . Frequency of Communication with Friends and Family: Not on file  . Frequency of Social Gatherings with Friends and Family: Not on file  . Attends Religious Services: Not on file  . Active Member of Clubs or Organizations: Not on file  . Attends Archivist Meetings: Not on file  . Marital Status: Not on file   Outpatient Encounter Medications as of 05/17/2020  Medication Sig  . ACCU-CHEK AVIVA PLUS test strip   . ACCU-CHEK SOFTCLIX LANCETS lancets   . acetaminophen (TYLENOL) 500 MG tablet Take 1,000 mg by mouth every 8 (eight) hours as needed for moderate pain or headache.   Marland Kitchen aspirin 81 MG chewable tablet Chew 1 tablet (81 mg total) by mouth 2 (two) times daily.  . bisacodyl (DULCOLAX) 10 MG suppository Place 1 suppository (10 mg total) rectally daily as needed for moderate constipation.  . EUTHYROX 112 MCG tablet TAKE 1 TABLET BY MOUTH ONCE DAILY BEFORE BREAKFAST  . HYDROcodone-acetaminophen (NORCO) 7.5-325 MG tablet Take 1 tablet by mouth every 6 (six) hours as needed for moderate pain.  Marland Kitchen losartan (COZAAR) 50 MG tablet Take 50 mg by mouth daily.   . metFORMIN (GLUCOPHAGE) 500 MG tablet Take 500 mg by mouth daily with breakfast.  . methocarbamol (ROBAXIN) 500 MG tablet Take 1 tablet (500 mg total) by mouth every 6 (six) hours as needed for muscle spasms.  Marland Kitchen omeprazole (PRILOSEC) 20 MG capsule Take 20 mg by mouth daily.  Marland Kitchen tiZANidine (ZANAFLEX) 4 MG tablet Take 1 tablet (4 mg total) by mouth 2 (two) times daily as needed for muscle spasms.   No  facility-administered encounter medications on file as of 05/17/2020.   ALLERGIES: No Known Allergies  VACCINATION STATUS: Immunization History  Administered Date(s) Administered  . Fluad Quad(high Dose 65+) 05/06/2019  . Pneumococcal Polysaccharide-23 05/06/2019    HPI 74 year old female patient with medical history as above.  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  - She underwent total thyroidectomy on 02/05/2017 which revealed a  papillary thyroid carcinoma on the right lower thyroid-0.5 cm foci with local extrathyroidal extension and another 0.2 cm separate tumor in the right lobe. The left lobe was said to be consistent with hyperplastic nodule. - She underwent I-131 thyroid remnant ablation on 04/15/2017 followed by post therapy whole body scan. -  On the scan, she was found to have foci of abnormal radioiodine accumulation in the cervical region, suggestive of thyroid remnant or metastatic cervical lymph node.  -Her subsequent thyroid/neck ultrasound  confirms decrease in size of this lymph node to 0.3 cm and no new concerns.  -She denies dysphagia, shortness of breath, nor voice change.  Her most recent thyroid/neck ultrasound is negative for any residual thyroid tissue nor cervical adenopathy, in September 2020.  Before this visit, in December 06, 2019 she underwent Thyrogen stimulated whole-body scan which did not show any evidence of tumor recurrence or distant metastasis.  -She is currently on levothyroxine 112 mcg p.o. daily before breakfast.  she reports compliance to this medication.  - She denies any family history of thyroid cancer, however one of her sisters have hyperthyroidism status post treatment with what appears to be radioactive iodine therapy.    Objective:    There were no vitals taken for this visit.  Wt Readings from Last 3  Encounters:  11/24/19 154 lb (69.9 kg)  05/04/19 154 lb (69.9 kg)  04/30/19 154 lb 8 oz (70.1 kg)       CMP ( most recent) CMP     Component Value Date/Time   NA 137 05/07/2019 0355   K 3.3 (L) 05/07/2019 0355   CL 104 05/07/2019 0355   CO2 26 05/07/2019 0355   GLUCOSE 128 (H) 05/07/2019 0355   BUN 8 05/07/2019 0355   CREATININE 0.59 05/07/2019 0355   CREATININE 0.80 03/26/2018 1041   CALCIUM 8.3 (L) 05/07/2019 0355   PROT 7.2 03/26/2018 1041   ALBUMIN 3.2 (L) 02/06/2017 0444   AST 16 03/26/2018 1041   ALT 15 03/26/2018 1041   ALKPHOS 68 02/06/2017 0444   BILITOT 0.4 03/26/2018 1041   GFRNONAA >60 05/07/2019 0355   GFRNONAA 74 03/26/2018 1041   GFRAA >60 05/07/2019 0355   GFRAA 85 03/26/2018 1041        Fine-needle aspiration cytology of the thyroid on 10/23/2016 showed: THYROID, FINE NEEDLE ASPIRATION, LEFT (SPECIMEN 1 OF 1, COLLECTED 10/23/16): CONSISTENT WITH BENIGN FOLLICULAR NODULE (BETHESDA CATEGORY II).  02/05/2017: She underwent total thyroidectomy: 02/05/2017: INAL DIAGNOSIS Diagnosis 1. Thyroid, lobectomy, left - NODULAR HYPERPLASIA (MULTINODULAR GOITER). 2. Thyroid, lobectomy, right - PAPILLARY THYROID CARCINOMA, TWO TUMORS, 0.5 AND 0.2 CM. - PAPILLARY CARCINOMA FOCALLY EXTENDS INTO THE PERITHYROIDAL CONNECTIVE TISSUE. - HURTHLE CELL ADENOMATOUS NODULE. - NODULAR HYPERPLASIA.  TNM code: pT1a , pNX.   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  No distant sites of iodine-avid metastatic thyroid cancer Identified.  Surveillance thyroid/neck ultrasound from 05/19/2017 showed:  Surgically absent thyroid lobes. A single morphologically unremarkable 0.8 cm left cervical lymph node is noted. No pathologic adenopathy.  IMPRESSION: No evidence of residual/ recurrent tissue post thyroidectomy   Thyroid/neck ultrasound on February 23, 2018 IMPRESSION: 1. Post total thyroidectomy without evidence of residual nodular tissue within the thyroidectomy resection bed. 2. Solitary left-sided cervical lymph node is not enlarged by size criteria measuring 0.3 cm  in diameter. No definitive bulky regional cervical lymphadenopathy.   Results for LIVI, MCGANN (MRN [id number]) as of 05/14/2019 12:40  Ref. Range 11/04/2018 12:01 04/30/2019 15:10  TSH Latest Ref Range: 0.40 - 4.50 Huang/L 0.20 (L) 0.69  T4,Free(Direct) Latest Ref Range: 0.8 - 1.8 ng/dL 1.5 1.5  Thyroglobulin Latest Units: ng/mL <0.1 (L) 0.1 (L)  Thyroglobulin Ab Latest Ref Range: < or = 1 IU/mL <1 <1   Thyroid/neck ultrasound on April 28, 2019 No discrete nodules are seen within the thyroidectomy bed. No regional adenopathy identified.   IMPRESSION: 1. No residual/recurrent tissue post thyroidectomy. No adenopathy identified.   Thyrogen stimulated whole-body scan on December 06, 2019  Resolution of previously identified abnormal uptake in the right cervical region.  No scintigraphic evidence of iodine - avid metastatic thyroid cancer.  Assessment & Plan:   1. 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 Her surgical sample unexpectedly revealed occult malignancy on the right lobe of the thyroid, 0.5 cm  with focal extrathyroidal extension and 0.2 cm foci.  TNM code: pT1a , pNX. - She is status post total thyroidectomy and I-131 thyroid remnant ablation on 04/15/2017.  - Thyroglobulin level and thyroglobulin antibodies are  low  on the day of or body scan. 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  No distant sites of iodine-avid metastatic thyroid cancer Identified.  - Surveillance  thyroid/neck ultrasound - is  unremarkable except for nonspecific 0.8 cm lymph node on the left cervical region.   Subsequent thyroid/neck ultrasound on February 23, 2018 showed decreasing size of his nonspecific lymph node to 0.3 cm. -Her most recent thyroid/neck ultrasound is negative for residual thyroid tissue nor thyroid lymphadenopathy.    -Her previsit Thyrogen stimulated whole-body scan-is negative for iodine avid distant metastasis, resolution of previously documented uptake in the cervical region.      2. Postsurgical hypothyroidism  -Her previsit thyroid function tests are consistent with appropriate suppressive therapy with TSH of 0.67. She is advised to continue levothyroxine 112 mcg p.o. daily before breakfast.     - The goal of therapy in thyroid cancer suppressive therapy is to keep TSH between 0.1-0.5 .   - We discussed about the correct intake of her thyroid hormone, on empty stomach at fasting, with water, separated by at least 30 minutes from breakfast and other medications,  and separated by more than 4 hours from calcium, iron, multivitamins, acid reflux medications (PPIs). -Patient is made aware of the fact that thyroid hormone replacement is needed for life, dose to be adjusted by periodic monitoring of thyroid function tests.  - I advised patient to maintain close follow up with Abran Richard, MD for primary care needs.     - Time spent on this patient care encounter:  20 minutes of which 50% was spent in  counseling and the rest reviewing  her current and  previous labs / studies and medications  doses and developing a plan for long term care. Kristina Huang  participated in the discussions, expressed understanding, and voiced agreement with the above plans.  All questions were answered to her satisfaction. she is encouraged to contact clinic should she have any questions or concerns prior to her return visit.  Follow up plan: Return in about 6 months (around 11/15/2020)  for F/U with Pre-visit  Labs.  Glade Lloyd, MD Phone: [phone]  Fax: [phone]  This note was partially dictated with voice recognition software. Similar sounding words can be transcribed inadequately or may not  be corrected upon review.  05/17/2020, 9:39 AM

## 2020-09-19 ENCOUNTER — Other Ambulatory Visit: Payer: Self-pay | Admitting: "Endocrinology

## 2020-10-02 ENCOUNTER — Encounter: Payer: Self-pay | Admitting: Internal Medicine

## 2020-11-10 LAB — T4, FREE: Free T4: 1.94 ng/dL — ABNORMAL HIGH (ref 0.82–1.77)

## 2020-11-10 LAB — TSH: TSH: 0.335 u[IU]/mL — ABNORMAL LOW (ref 0.450–4.500)

## 2020-11-13 ENCOUNTER — Ambulatory Visit (INDEPENDENT_AMBULATORY_CARE_PROVIDER_SITE_OTHER): Payer: Self-pay | Admitting: *Deleted

## 2020-11-13 ENCOUNTER — Encounter: Payer: Self-pay | Admitting: *Deleted

## 2020-11-13 ENCOUNTER — Other Ambulatory Visit: Payer: Self-pay

## 2020-11-13 VITALS — Ht 63.0 in | Wt 155.0 lb

## 2020-11-13 DIAGNOSIS — Z86010 Personal history of colonic polyps: Secondary | ICD-10-CM

## 2020-11-13 MED ORDER — PEG 3350-KCL-NA BICARB-NACL 420 G PO SOLR: 4000.0000 mL | Freq: Once | ORAL | 0 refills | Status: AC

## 2020-11-13 NOTE — Progress Notes (Addendum)
Gastroenterology Pre-Procedure Review  Request Date: 11/13/2020 Requesting Physician: 3 year recall, Last TCS done 10/24/2017 by Dr. Oneida Alar, 1 serrated adenoma and 3 simple adenomas, hx of colonic polyps  PATIENT REVIEW QUESTIONS: The patient responded to the following health history questions as indicated:    1. Diabetes Melitis: yes, type II 2. Joint replacements in the past 12 months: no, but 2 hip replacements in past 2 years done by Dr. Rush Farmer, right hip 04/2019, left hip 07/2018 3. Major health problems in the past 3 months: no 4. Has an artificial valve or MVP: no 5. Has a defibrillator: no 6. Has been advised in past to take antibiotics in advance of a procedure like teeth cleaning: no 7. Family history of colon cancer: no 8. Alcohol Use: no 9. Illicit drug Use: no 10. History of sleep apnea: no  11. History of coronary artery or other vascular stents placed within the last 12 months: no 12. History of any prior anesthesia complications: no 13. Body mass index is 27.46 kg/m.    MEDICATIONS & ALLERGIES:    Patient reports the following regarding taking any blood thinners:   Plavix? no Aspirin? yes, 81 mg Coumadin? no Brilinta? no Xarelto? no Eliquis? no Pradaxa? no Savaysa? no Effient? no  Patient confirms/reports the following medications:  Current Outpatient Medications  Medication Sig Dispense Refill  . ACCU-CHEK AVIVA PLUS test strip     . ACCU-CHEK SOFTCLIX LANCETS lancets     . acetaminophen (TYLENOL) 500 MG tablet Take 1,000 mg by mouth daily as needed for moderate pain or headache.    Marland Kitchen aspirin 81 MG chewable tablet Chew 1 tablet (81 mg total) by mouth 2 (two) times daily. (Patient taking differently: Chew 81 mg by mouth daily.) 30 tablet 0  . EUTHYROX 112 MCG tablet TAKE 1 TABLET BY MOUTH ONCE DAILY BEFORE BREAKFAST 90 tablet 0  . losartan (COZAAR) 50 MG tablet Take 50 mg by mouth daily.     . metFORMIN (GLUCOPHAGE) 500 MG tablet Take 500 mg by mouth daily  with breakfast.    . omeprazole (PRILOSEC) 20 MG capsule Take 20 mg by mouth daily.    . diclofenac (VOLTAREN) 75 MG EC tablet TAKE 1 TABLET TWICE DAILY WITH FOOD OR MILK     No current facility-administered medications for this visit.    Patient confirms/reports the following allergies:  No Known Allergies  No orders of the defined types were placed in this encounter.   AUTHORIZATION INFORMATION Primary Insurance: [place],  [place] #: [id number] Pre-Cert / Josem Kaufmann required: Yes, approved online 4/0/9811-04/12/4781 Pre-Cert / Josem Kaufmann #: [id number]  SCHEDULE INFORMATION: Procedure has been scheduled as follows:  Date: 12/15/2020, Time: AM procedure Location: [facility] with Dr. Abbey Chatters  This Gastroenterology Pre-Precedure Review Form is being routed to the following provider(s): Aliene Altes, PA

## 2020-11-13 NOTE — Progress Notes (Signed)
Mailed letter to pt with diabetes medication adjustments.   

## 2020-11-13 NOTE — Progress Notes (Signed)
Ok to schedule.   ASA II  Day prior to colonoscopy: Continue diabetes medications as prescribed. Monitor blood sugars closely and correct any low blood sugars with approved sugary clear liquids.  Day of colonoscopy: No diabetes medications morning of procedure.

## 2020-11-13 NOTE — Patient Instructions (Signed)
Remember Covid test: 12/13/2020 at 10:30 AM.   Kristina Huang   [birth year] MRN: [id number] Procedure Date: 12/15/2020 Arrival Time:   You will receive a call from the hospital a few days before your procedure.    Location of Procedure: [facility]  PREPARATION FOR COLONOSCOPY WITH TRI-LYTE PREP   Please notify us immediately if you are diabetic, take iron supplements, or if you are on Coumadin or any other blood thinners.   Please hold the following medications: See letter  You will need to purchase 1 fleet enema and 1 box of Bisacodyl 5mg  tablets.   PROCEDURE IS SCHEDULED FOR Kristina Huang AS FOLLOWS:  Procedure Date: 12/15/2020 Time to register: You will receive a call from the hospital a few days before your procedure. Place to register: [facility] Scheduled provider: Dr. Abbey Chatters   2 DAYS BEFORE PROCEDURE:  DATE: 12/13/2020   DAY: Wednesday Begin clear liquid diet AFTER your lunch meal. NO SOLID FOODS!   1 DAY BEFORE PROCEDURE:  DATE: 12/14/2020   DAY: Thursday  Continue clear liquids the entire day - NO SOLID FOOD.   Diabetic medications adjustments for today: See letter  At 12:00pm (noon): Take 2 (two) Dulcolax (Bisacodyl) tablets  At 2:00pm: Start drinking your solution. Try to drink 1 (one) 8 ounce glass every 10-15 minutes, until you have consumed HALF the jug. (You should complete the first 1/2 of the jug in 2 hours. Wait 30 minutes, then drink 3-4 more glasses of the solution. Your stools should be clear; if not, you may have to consume the rest of the jug.   One hour after completing the solution: take the last 2 (two) Dulcolax (Bisacodyl) tablets, with a clear liquid.  YOU MUST DRINK PLENTY OF CLEAR LIQUIDS DURING YOUR PREP TO REDUCE RISKS OF KIDNEY FAILURE.   Continue clear liquids only, until midnight. Do not eat or drink anything after midnight.  EXCEPTION:  If you take medications for your heart, blood pressure or breathing, you may take  these medications with a small amount of clear liquid.      DAY OF PROCEDURE:   DATE: 12/15/2020     DAY: Friday The morning of your procedure give yourself 1 (one) Fleet Enema, at least 1 hour before going to the hospital.   You may take Tylenol products. Please continue your regular medications unless we have instructed otherwise.   Diabetic medications adjustments for today. See letter  Someone MUST be available to drive you home; the hospital will cancel this appointment if you do not have a driver.   Please call the office if you have any questions (Dept: [phone]).  Please see below for Dietary Information.  CLEAR LIQUIDS INCLUDE:  Water Jello (NOT red in color)   Ice Popsicles (NOT red in color)   Tea (sugar ok, no milk/cream) Powdered fruit flavored drinks  Coffee (sugar ok, no milk/cream) Gatorade/ Lemonade/ Kool-Aid  (NOT red in color)   Juice: apple, white grape, white cranberry Soft drinks  Clear bullion, consomme, broth (fat free beef/chicken/vegetable)  Carbonated beverages (any kind)  Strained chicken noodle soup Hard Candy   REMEMBER: Clear liquids are liquids that will allow you to see your fingers on the other side of a clear glass. Be sure liquids are NOT red in color, and not cloudy, but CLEAR.   DO NOT EAT OR DRINK ANY OF THE FOLLOWING:  Dairy products of any kind   Cranberry juice Tomato juice / V8  juice   Grapefruit juice Orange juice     Red grape juice  Do not eat any solid foods, including such foods as: cereal, oatmeal, yogurt, fruits, vegetables, creamed soups, eggs, bread, etc.    HELPFUL HINTS FOR DRINKING PREP SOLUTION:   Make sure prep is extremely cold. Refrigerate the night before. You may also put in the freezer.   You may try mixing some Crystal Light or Country Time Lemonade if you prefer. Mix in small amounts; add more if necessary.  Try drinking through a straw  Rinse mouth with water or a mouthwash between glasses, to remove  after-taste.  Try sipping on a cold beverage /ice/ popsicles between glasses of prep  Place a piece of sugar-free hard candy in mouth between glasses  If you become nauseated, try consuming smaller amounts, or stretch out the time between glasses. Stop for 30-60 minutes, then slowly start back drinking    You may call the office (Dept: [phone]) before 5:00pm, or page the doctor on call after 5:00pm ([phone]), for further instructions, if necessary.   OTHER INSTRUCTIONS  You will need a responsible adult at least 75 years of age to accompany you and drive you home. This person must remain in the waiting room during your procedure.  Wear loose fitting clothing that is easily removed.  Leave jewelry and other valuables at home.   Remove all body piercing jewelry and leave at home.  Total time from sign-in until discharge is approximately 2-3 hours.  You should go home directly after your procedure and rest. You can resume normal activities the day after your procedure.  The day of your procedure you should not:  Drive  Make legal decisions  Operate machinery  Drink alcohol  Return to work

## 2020-11-16 ENCOUNTER — Ambulatory Visit (INDEPENDENT_AMBULATORY_CARE_PROVIDER_SITE_OTHER): Payer: Medicare HMO | Admitting: "Endocrinology

## 2020-11-16 ENCOUNTER — Encounter: Payer: Self-pay | Admitting: "Endocrinology

## 2020-11-16 ENCOUNTER — Other Ambulatory Visit: Payer: Self-pay | Admitting: *Deleted

## 2020-11-16 VITALS — BP 144/82 | HR 84 | Ht 63.0 in | Wt 155.4 lb

## 2020-11-16 DIAGNOSIS — E890 Postprocedural hypothyroidism: Secondary | ICD-10-CM

## 2020-11-16 DIAGNOSIS — C73 Malignant neoplasm of thyroid gland: Secondary | ICD-10-CM

## 2020-11-16 MED ORDER — LEVOTHYROXINE SODIUM 100 MCG PO TABS: 100.0000 ug | ORAL_TABLET | Freq: Every day | ORAL | 1 refills | Status: DC

## 2020-11-16 NOTE — Progress Notes (Signed)
11/16/2020                                        Endocrinology Telehealth Visit Follow up Note -During COVID -19 Pandemic  This visit type was conducted  via telephone due to national recommendations for restrictions regarding the COVID-19 Pandemic  in an effort to limit this patient's exposure and mitigate transmission of the corona virus.   I connected with Kristina Huang on 11/16/2020   by telephone and verified that I am speaking with the correct person using two identifiers. Kristina Huang, [birth year]. she has verbally consented to this visit.  I was in my office and patient was in her residence. No other persons were with me during the encounter. All issues noted in this document were discussed and addressed. The format was not optimal for physical exam.   Subjective:    Patient ID: Kristina Huang, female    DOB: 07-19-1946, PCP Abran [place], MD   Past Medical History:  Diagnosis Date  . Acid reflux   . Arthritis   . Cancer ([place])    Thyroid  . Diabetes mellitus without complication (HCC)    fasting 100-130  . Hypercholesterolemia   . Hypertension   . 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 BACK SURGERY    . BUNIONECTOMY Bilateral   . CHOLECYSTECTOMY     Dr. Irving Shows  . COLONOSCOPY  2009   Dr. Oneida Alar: moderate internal hemorrhoids, frequent sigmoid colon diverticula, polyps X 3, one large adenoma otherwise hyperplastic  . COLONOSCOPY N/A 09/09/2014   Procedure: COLONOSCOPY;  Surgeon: Danie Binder, MD;  [facility];  Service: Endoscopy;  Laterality: N/A;  200 - moved to 1/29 @ 12:45  . COLONOSCOPY N/A 10/24/2017   Procedure: COLONOSCOPY;  Surgeon: Danie Binder, MD;  [facility];  Service: Endoscopy;  Laterality: N/A;  9:30  . ESOPHAGOGASTRODUODENOSCOPY N/A 09/09/2014   Procedure: ESOPHAGOGASTRODUODENOSCOPY (EGD);  Surgeon: Danie Binder, MD;  [facility];  Service: Endoscopy;  Laterality: N/A;  . EYE SURGERY Right   . JOINT REPLACEMENT    . LAMINECTOMY  1997  . MAXIMUM ACCESS (MAS)POSTERIOR LUMBAR INTERBODY FUSION (PLIF) 2 LEVEL  11/2013   POSTERIOR LUMBAR INTERBODY FUSION POSTERIOR LATERAL ARTHRODESIS POSTERIOR NONSEGMENTAL INSTRUMENTATION  LUMBAR TWO-THREE ,LUMBAR THREE-FOUR WITH HARDWARE REMOVAL LUMBAR FOUR-FIVE/notes 11/17/2013  . THYROIDECTOMY N/A 02/05/2017   Procedure: THYROIDECTOMY;  Surgeon: Aviva Signs, MD;  Location: [facility];  Service: General;  Laterality: N/A;  . TOTAL HIP ARTHROPLASTY Left 07/21/2018  . TOTAL HIP ARTHROPLASTY Left 07/21/2018   Procedure: LEFT TOTAL HIP ARTHROPLASTY ANTERIOR APPROACH;  Surgeon: Mcarthur Rossetti, MD;  Location: [place];  Service: Orthopedics;  Laterality: Left;  . TOTAL HIP ARTHROPLASTY Right 05/04/2019   Procedure: RIGHT TOTAL HIP ARTHROPLASTY ANTERIOR APPROACH;  Surgeon: Mcarthur Rossetti, MD;  Location: [place];  Service: Orthopedics;  Laterality: Right;  . TUBAL LIGATION     Social History   Socioeconomic History  . Marital status: Divorced    Spouse name: Not on file  . Number of children: Not on file  .  Years of education: Not on file  . Highest education level: Not on file  Occupational History  . Not on file  Tobacco Use  . Smoking status: Current Some Day Smoker    Packs/day: 0.25    Years: 2.00    Pack years: 0.50    Types: Cigarettes    Last attempt to quit: 01/31/2010    Years since quitting: 10.8  . Smokeless tobacco: Never Used  Vaping Use  . Vaping Use: Never used  Substance and Sexual Activity  . Alcohol use: No    Alcohol/week: 0.0 standard drinks  . Drug use: No  . Sexual activity: Not Currently    Birth control/protection: Surgical  Other Topics Concern  . Not on file  Social History Narrative  . Not on file   Social Determinants of Health   Financial Resource Strain: Not on file  Food Insecurity: Not on file   Transportation Needs: Not on file  Physical Activity: Not on file  Stress: Not on file  Social Connections: Not on file   Outpatient Encounter Medications as of 11/16/2020  Medication Sig  . ACCU-CHEK AVIVA PLUS test strip   . ACCU-CHEK SOFTCLIX LANCETS lancets   . acetaminophen (TYLENOL) 500 MG tablet Take 1,000 mg by mouth daily as needed for moderate pain or headache.  Marland Kitchen aspirin 81 MG chewable tablet Chew 1 tablet (81 mg total) by mouth 2 (two) times daily. (Patient taking differently: Chew 81 mg by mouth daily.)  . diclofenac (VOLTAREN) 75 MG EC tablet TAKE 1 TABLET TWICE DAILY WITH FOOD OR MILK  . levothyroxine (EUTHYROX) 100 MCG tablet Take 1 tablet (100 mcg total) by mouth daily before breakfast.  . losartan (COZAAR) 50 MG tablet Take 50 mg by mouth daily.   . metFORMIN (GLUCOPHAGE) 500 MG tablet Take 500 mg by mouth daily with breakfast.  . omeprazole (PRILOSEC) 20 MG capsule Take 20 mg by mouth daily.  . [DISCONTINUED] EUTHYROX 112 MCG tablet TAKE 1 TABLET BY MOUTH ONCE DAILY BEFORE BREAKFAST   No facility-administered encounter medications on file as of 11/16/2020.   ALLERGIES: No Known Allergies  VACCINATION STATUS: Immunization History  Administered Date(s) Administered  . Fluad Quad(high Dose 65+) 05/06/2019  . Pneumococcal Polysaccharide-23 05/06/2019    HPI  75 year old female patient with medical history as above.    She is returning for follow-up for history of thyroid malignancy and postsurgical hypothyroidism.  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 The left lobe was said to be consistent with hyperplastic nodule. - She underwent I-131 thyroid remnant ablation on 04/15/2017 followed by post therapy whole body scan. -  On the scan, she was found to have foci  of abnormal radioiodine accumulation in the cervical region, suggestive of thyroid remnant or metastatic cervical lymph node.  -Her subsequent thyroid/neck ultrasound  confirms decrease in size of this lymph node to 0.3 cm and no new concerns.  -She denies dysphagia, shortness of breath, nor voice change.  Her most recent thyroid/neck ultrasound is negative for any residual thyroid tissue nor cervical adenopathy, in September 2020.  Prior to her October 2021 visit , in December 06, 2019 she underwent Thyrogen stimulated whole-body scan which did not show any evidence of tumor recurrence or distant metastasis.  -She is currently on levothyroxine 112 mcg p.o. daily before breakfast.  She is compliant.  Her previsit labs are consistent with slight over replacement.    - She denies any family history of thyroid cancer, however one of her sisters have hyperthyroidism status post treatment with what appears to be radioactive iodine therapy.    Objective:    BP (!) 144/82   Pulse 84   Ht 5\' 3"  (1.6 m)   Wt 155 lb 6.4 oz (70.5 kg)   BMI 27.53 kg/m   Wt Readings from Last 3 Encounters:  11/16/20 155 lb 6.4 oz (70.5 kg)  11/13/20 155 lb (70.3 kg)  11/24/19 154 lb (69.9 kg)       CMP ( most recent) CMP     Component Value Date/Time   NA 137 05/07/2019 0355   K 3.3 (L) 05/07/2019 0355   CL 104 05/07/2019 0355   CO2 26 05/07/2019 0355   GLUCOSE 128 (H) 05/07/2019 0355   BUN 8 05/07/2019 0355   CREATININE 0.59 05/07/2019 0355   CREATININE 0.80 03/26/2018 1041   CALCIUM 8.3 (L) 05/07/2019 0355   PROT 7.2 03/26/2018 1041   ALBUMIN 3.2 (L) 02/06/2017 0444   AST 16 03/26/2018 1041   ALT 15 03/26/2018 1041   ALKPHOS 68 02/06/2017 0444   BILITOT 0.4 03/26/2018 1041   GFRNONAA >60 05/07/2019 0355   GFRNONAA 74 03/26/2018 1041   GFRAA >60 05/07/2019 0355   GFRAA 85 03/26/2018 1041        Fine-needle aspiration cytology of the thyroid on 10/23/2016 showed: THYROID, FINE NEEDLE  ASPIRATION, LEFT (SPECIMEN 1 OF 1, COLLECTED 10/23/16): CONSISTENT WITH BENIGN FOLLICULAR NODULE (BETHESDA CATEGORY II).  02/05/2017: She underwent total thyroidectomy: 02/05/2017: INAL DIAGNOSIS Diagnosis 1. Thyroid, lobectomy, left - NODULAR HYPERPLASIA (MULTINODULAR GOITER). 2. Thyroid, lobectomy, right - PAPILLARY THYROID CARCINOMA, TWO TUMORS, 0.5 AND 0.2 CM. - PAPILLARY CARCINOMA FOCALLY EXTENDS INTO THE PERITHYROIDAL CONNECTIVE TISSUE. - HURTHLE CELL ADENOMATOUS NODULE. - NODULAR HYPERPLASIA.  TNM code: pT1a , pNX.   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  No distant sites of iodine-avid metastatic thyroid cancer Identified.  Surveillance thyroid/neck ultrasound from 05/19/2017 showed:  Surgically absent thyroid lobes. A single morphologically unremarkable 0.8 cm left cervical lymph node is noted. No pathologic adenopathy.  IMPRESSION: No evidence of residual/ recurrent tissue post thyroidectomy   Thyroid/neck ultrasound on February 23, 2018 IMPRESSION: 1. Post total thyroidectomy without evidence of residual nodular tissue within the thyroidectomy resection bed. 2. Solitary left-sided cervical lymph node is not enlarged by size criteria measuring 0.3 cm in diameter. No definitive bulky regional cervical lymphadenopathy.   Results for LORIS, SEELYE (MRN [id number]) as of 05/14/2019 12:40  Ref. Range 11/04/2018 12:01 04/30/2019 15:10  TSH Latest Ref Range: 0.40 - 4.50 Huang/L 0.20 (L) 0.69  T4,Free(Direct) Latest Ref Range: 0.8 - 1.8 ng/dL 1.5 1.5  Thyroglobulin Latest Units: ng/mL <0.1 (L) 0.1 (L)  Thyroglobulin Ab Latest Ref Range: < or = 1 IU/mL <1 <1   Thyroid/neck ultrasound on April 28, 2019 No discrete nodules are seen within the thyroidectomy bed. No regional adenopathy identified.   IMPRESSION: 1. No residual/recurrent tissue  post thyroidectomy. No adenopathy identified.   Thyrogen stimulated whole-body scan on December 06, 2019  Resolution of previously identified abnormal uptake in the right cervical region.  No scintigraphic evidence of iodine - avid metastatic thyroid cancer.  Assessment & Plan:   1. 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 Her surgical sample unexpectedly revealed occult malignancy on the right lobe of the thyroid, 0.5 cm  with focal extrathyroidal extension and 0.2 cm foci.  TNM code: pT1a , pNX. - She is status post total thyroidectomy and I-131 thyroid remnant ablation on 04/15/2017.  - Thyroglobulin level and thyroglobulin antibodies are  low  on the day of or body scan. 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  No distant sites of iodine-avid metastatic thyroid cancer Identified.  - Surveillance  thyroid/neck ultrasound - is unremarkable except for nonspecific 0.8 cm lymph node on the left cervical region.   Subsequent thyroid/neck ultrasound on February 23, 2018 showed decreasing size of his nonspecific lymph node to 0.3 cm. -Her most recent thyroid/neck ultrasound is negative for residual thyroid tissue nor thyroid lymphadenopathy.    -Her previsit Thyrogen stimulated whole-body scan-is negative for iodine avid distant metastasis, resolution of previously documented uptake in the cervical region.   -She will be considered for previsit thyroid/neck surveillance thyroid ultrasound before her next visit in 6 months.     2. Postsurgical hypothyroidism  Her previsit thyroid function tests are consistent with slight over replacement.  I discussed and lowered her levothyroxine to 100 mcg p.o. daily before breakfast.  - The goal of therapy in thyroid  cancer suppressive therapy is to keep TSH between 0.1-0.5 .   - We discussed about the correct intake of her thyroid hormone, on empty stomach at fasting, with water, separated by at least 30 minutes from breakfast and other medications,  and separated by more than 4 hours from calcium, iron, multivitamins, acid reflux medications (PPIs). -Patient is made aware of the fact that thyroid hormone replacement is needed for life, dose to be adjusted by periodic monitoring of thyroid function tests.   - I advised patient to maintain close follow up with Abran Richard, MD for primary care needs.    I spent 32 minutes in the care of the patient today including review of labs from Thyroid Function, CMP, and other relevant labs ; imaging/biopsy records (current and previous including abstractions from other facilities); face-to-face time discussing  her lab results and symptoms, medications doses, her options of short and long term treatment based on the latest standards of care / guidelines;   and documenting the encounter.  Kristina Huang  participated in the discussions, expressed understanding, and voiced agreement with the above plans.  All questions were answered to her satisfaction. she is encouraged to contact clinic should she have any questions or concerns prior to her return visit.   Follow up plan: Return in about 6 months (around 05/18/2021) for F/U with Pre-visit Labs, Thyroid / Neck Ultrasound.  Glade Lloyd, MD Phone: [phone]  Fax: [phone]  This note was partially dictated with voice recognition software. Similar sounding words can be transcribed inadequately or may not  be corrected upon review.  11/16/2020, 10:21 AM

## 2020-11-26 ENCOUNTER — Encounter: Payer: Self-pay | Admitting: Emergency Medicine

## 2020-11-26 ENCOUNTER — Other Ambulatory Visit: Payer: Self-pay

## 2020-11-26 ENCOUNTER — Ambulatory Visit
Admission: EM | Admit: 2020-11-26 | Discharge: 2020-11-26 | Disposition: A | Discharge status: Home / Self Care | Payer: Medicare HMO | Attending: Family Medicine | Admitting: Family Medicine

## 2020-11-26 DIAGNOSIS — M25552 Pain in left hip: Secondary | ICD-10-CM | POA: Diagnosis not present

## 2020-11-26 DIAGNOSIS — M5442 Lumbago with sciatica, left side: Secondary | ICD-10-CM

## 2020-11-26 MED ORDER — TIZANIDINE HCL 2 MG PO TABS: 2.0000 mg | ORAL_TABLET | Freq: Every day | ORAL | 0 refills | Status: DC

## 2020-11-26 MED ORDER — DEXAMETHASONE SODIUM PHOSPHATE 10 MG/ML IJ SOLN
10.0000 mg | Freq: Once | INTRAMUSCULAR | Status: AC
  Administered 2020-11-26: 10 mg via INTRAMUSCULAR

## 2020-11-26 MED ORDER — KETOROLAC TROMETHAMINE 30 MG/ML IJ SOLN
15.0000 mg | Freq: Once | INTRAMUSCULAR | Status: AC
  Administered 2020-11-26: 15 mg via INTRAMUSCULAR

## 2020-11-26 NOTE — ED Provider Notes (Signed)
[facility]    CSN: [id number] Arrival date & time: 11/26/20  1203      History   Chief Complaint No chief complaint on file.   HPI Kristina Huang is a 75 y.o. female.   HPI Patient with a known history of chronic osteoarthritis involving the left hip presents today with left hip pain ongoing for several months.  Patient has a distant history of a left hip replacement and was previously followed by Dr. Jean Rosenthal and Antionette Char through [facility]. Dr. Rush Farmer felt at that time, pain was mostly related to lumbar DDD oppose to hip.  Current episode of pain has been going on for greater than a year.  Patient completed physical therapy as she felt it was not helping with her pain.  She has taken some old pain medication yesterday and reports minimal relief.  Denies any falls or known injuries.  She is scheduled for follow-up with her primary care the beginning of next month. Past Medical History:  Diagnosis Date  . Acid reflux   . Arthritis   . Cancer ([place])    Thyroid  . Diabetes mellitus without complication (HCC)    fasting 100-130  . Hypercholesterolemia   . Hypertension   . 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 Status post total replacement of right hip 05/04/2019  . Unilateral primary osteoarthritis, right hip 03/09/2019  . Vitamin D deficiency 08/13/2018  . Prediabetes 08/13/2018  . Unilateral primary osteoarthritis, left hip 07/21/2018  . Status post total replacement of left hip 07/21/2018  . Malignant neoplasm of thyroid gland ([place]) 03/18/2017  . 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 Multinodular goiter 12/10/2016  . Personal history of colonic polyps 08/23/2014  . GERD (gastroesophageal reflux disease) 08/23/2014  . Chronic diarrhea 08/23/2014  . Spondylolisthesis at L3-L4 level 11/17/2013  . COLONIC POLYPS, HYPERPLASTIC 02/03/2009  . HYPERLIPIDEMIA  02/03/2009  . HYPERTENSION 02/03/2009  . GERD 02/03/2009  . ARTHRITIS 02/03/2009    Past Surgical History:  Procedure Laterality Date  . BACK SURGERY    . BUNIONECTOMY Bilateral   . CHOLECYSTECTOMY     Dr. Irving Shows  . COLONOSCOPY  2009   Dr. Oneida Alar: moderate internal hemorrhoids, frequent sigmoid colon diverticula, polyps X 3, one large adenoma otherwise hyperplastic  . COLONOSCOPY N/A 09/09/2014   Procedure: COLONOSCOPY;  Surgeon: Danie Binder, MD;  [facility];  Service: Endoscopy;  Laterality: N/A;  200 - moved to 1/29 @ 12:45  . COLONOSCOPY N/A 10/24/2017   Procedure: COLONOSCOPY;  Surgeon: Danie Binder, MD;  [facility];  Service: Endoscopy;  Laterality: N/A;  9:30  . ESOPHAGOGASTRODUODENOSCOPY N/A 09/09/2014   Procedure: ESOPHAGOGASTRODUODENOSCOPY (EGD);  Surgeon: Danie Binder, MD;  [facility];  Service: Endoscopy;  Laterality: N/A;  . EYE SURGERY Right   . JOINT REPLACEMENT    . LAMINECTOMY  1997  . MAXIMUM ACCESS (MAS)POSTERIOR LUMBAR INTERBODY FUSION (PLIF) 2 LEVEL  11/2013   POSTERIOR LUMBAR INTERBODY FUSION POSTERIOR LATERAL ARTHRODESIS POSTERIOR NONSEGMENTAL INSTRUMENTATION  LUMBAR TWO-THREE ,LUMBAR THREE-FOUR WITH HARDWARE REMOVAL LUMBAR FOUR-FIVE/notes 11/17/2013  . THYROIDECTOMY N/A 02/05/2017   Procedure: THYROIDECTOMY;  Surgeon: Aviva Signs, MD;  [facility];  Service: General;  Laterality: N/A;  . TOTAL HIP ARTHROPLASTY Left 07/21/2018  . TOTAL HIP ARTHROPLASTY Left 07/21/2018   Procedure: LEFT TOTAL HIP ARTHROPLASTY ANTERIOR APPROACH;  Surgeon:  Mcarthur Rossetti, MD;  Location: [place];  Service: Orthopedics;  Laterality: Left;  . TOTAL HIP ARTHROPLASTY Right 05/04/2019   Procedure: RIGHT TOTAL HIP ARTHROPLASTY ANTERIOR APPROACH;  Surgeon: Mcarthur Rossetti, MD;  Location: [place];  Service: Orthopedics;  Laterality: Right;  . TUBAL LIGATION      OB History   No obstetric history on file.      Home  Medications    Prior to Admission medications   Medication Sig Start Date End Date Taking? Authorizing Provider  ACCU-CHEK AVIVA PLUS test strip  07/02/18   [provider]  ACCU-CHEK SOFTCLIX LANCETS lancets  07/02/18   [provider]  acetaminophen (TYLENOL) 500 MG tablet Take 1,000 mg by mouth daily as needed for moderate pain or headache.    [provider]  aspirin 81 MG chewable tablet Chew 1 tablet (81 mg total) by mouth 2 (two) times daily. Patient taking differently: Chew 81 mg by mouth daily. 05/06/19   Mcarthur Rossetti, MD  diclofenac (VOLTAREN) 75 MG EC tablet TAKE 1 TABLET TWICE DAILY WITH FOOD OR MILK    [provider]  levothyroxine (EUTHYROX) 100 MCG tablet Take 1 tablet (100 mcg total) by mouth daily before breakfast. 11/16/20   Nida, Marella Chimes, MD  losartan (COZAAR) 50 MG tablet Take 50 mg by mouth daily.  11/25/16   [provider]  metFORMIN (GLUCOPHAGE) 500 MG tablet Take 500 mg by mouth daily with breakfast.    [provider]  omeprazole (PRILOSEC) 20 MG capsule Take 20 mg by mouth daily.    [provider]    Family History Family History  Problem Relation Age of Onset  . Heart disease Mother   . Heart disease Father   . Colon cancer Neg Hx     Social History Social History   Tobacco Use  . Smoking status: Current Some Day Smoker    Packs/day: 0.25    Years: 2.00    Pack years: 0.50    Types: Cigarettes    Last attempt to quit: 01/31/2010    Years since quitting: 10.8  . Smokeless tobacco: Never Used  Vaping Use  . Vaping Use: Never used  Substance Use Topics  . Alcohol use: No    Alcohol/week: 0.0 standard drinks  . Drug use: No     Allergies   Patient has no known allergies.   Review of Systems Review of Systems Pertinent negatives listed in HPI   Physical Exam Triage Vital Signs ED Triage Vitals  Enc Vitals Group     BP 11/26/20 1213 (!) 175/104     Pulse Rate  11/26/20 1213 (!) 109     Resp 11/26/20 1213 18     Temp 11/26/20 1213 98.5 F (36.9 C)     Temp Source 11/26/20 1213 Oral     SpO2 11/26/20 1213 97 %     Weight --      Height --      Head Circumference --      Peak Flow --      Pain Score 11/26/20 1214 10     Pain Loc --      Pain Edu? --      Excl. in [place]? --    No data found.  Updated Vital Signs BP (!) 175/104 (BP Location: Right Arm) Comment: states she has not taken her BP  medication  Pulse (!) 109   Temp 98.5 F (36.9 C) (Oral)  Resp 18   SpO2 97%   Visual Acuity Right Eye Distance:   Left Eye Distance:   Bilateral Distance:    Right Eye Near:   Left Eye Near:    Bilateral Near:     Physical Exam General appearance: alert, well developed, well nourished Head: Normocephalic, without obvious abnormality, atraumatic Respiratory: Respirations even and unlabored, normal respiratory rate Heart: Rate and rhythm normal. No gallop or murmurs noted on exam  Abdomen: BS +, no distention, no rebound tenderness, or no mass Lumbar spine: no acute deformity, strength 5/5 Left Hip: No acute deformity. Antalgic gait present Skin: Skin color, texture, turgor normal. No rashes seen  Psych: Appropriate mood and affect. Neurologic: GCS 15, normal coordination, normal gait   UC Treatments / Results  Labs (all labs ordered are listed, but only abnormal results are displayed) Labs Reviewed - No data to display  EKG   Radiology No results found.  Procedures Procedures (including critical care time)  Medications Ordered in UC Medications - No data to display  Initial Impression / Assessment and Plan / UC Course  I have reviewed the triage vital signs and the nursing notes.  Pertinent labs & imaging results that were available during my care of the patient were reviewed by me and considered in my medical decision making (see chart for details).    Acute left-sided back pain with sciatica and associated acute left  hip pain, Decadron 10 mg IM given in clinic. Tizanidine at bedtime prescribed for acute pain. Follow-up with PCP to request a referral to neurosurgery  Final Clinical Impressions(s) / UC Diagnoses   Final diagnoses:  Acute left-sided back pain with sciatica  Acute pain of left hip   Discharge Instructions   None    ED Prescriptions    Medication Sig Dispense Auth. Provider   tiZANidine (ZANAFLEX) 2 MG tablet Take 1 tablet (2 mg total) by mouth at bedtime. 20 tablet Scot Jun, FNP     PDMP not reviewed this encounter.   Scot Jun, FNP 11/26/20 1248

## 2020-11-26 NOTE — ED Triage Notes (Signed)
Left hip pain x several years.  States pain has gotten worse over the past month.

## 2020-11-30 ENCOUNTER — Telehealth: Payer: Self-pay | Admitting: Orthopedic Surgery

## 2020-11-30 NOTE — Telephone Encounter (Signed)
Patient called to follow up regarding recent visit to [facility] in [place], as well as contacting her primary care, and her back specialist, to request immediate appointment for her low back pain radiating to leg.  States did "have shot" by Urgent care provider but is "needing for someone to do something about her pain."  States had seen Dr Aline Brochure in past, although interested in scheduling, if possible, with Dr Amedeo Kinsman. Relayed that Dr Amedeo Kinsman is out of office for several days, Dr Aline Brochure is finishing clinic to leave to do emergency surgery, and that Dr Luna Glasgow is also finished with morning clinic - therefore, no immediate appointments. Relayed to await calls back from her primary care, and from her back specialist, and if still needing to schedule at our office when we have availability, to call us back. Thanked [place].

## 2020-12-12 ENCOUNTER — Other Ambulatory Visit (HOSPITAL_COMMUNITY): Payer: Self-pay | Admitting: Internal Medicine

## 2020-12-12 DIAGNOSIS — Z1231 Encounter for screening mammogram for malignant neoplasm of breast: Secondary | ICD-10-CM

## 2020-12-13 ENCOUNTER — Other Ambulatory Visit (HOSPITAL_COMMUNITY)
Admission: RE | Admit: 2020-12-13 | Discharge: 2020-12-13 | Disposition: A | Discharge status: Home / Self Care | Payer: Medicare HMO | Source: Ambulatory Visit | Attending: Internal Medicine | Admitting: Internal Medicine

## 2020-12-13 ENCOUNTER — Other Ambulatory Visit: Payer: Self-pay

## 2020-12-13 DIAGNOSIS — Z20822 Contact with and (suspected) exposure to covid-19: Secondary | ICD-10-CM | POA: Diagnosis not present

## 2020-12-13 DIAGNOSIS — Z01812 Encounter for preprocedural laboratory examination: Secondary | ICD-10-CM | POA: Diagnosis present

## 2020-12-13 LAB — SARS CORONAVIRUS 2 (TAT 6-24 HRS): SARS Coronavirus 2: NEGATIVE

## 2020-12-15 ENCOUNTER — Ambulatory Visit (HOSPITAL_COMMUNITY): Payer: Medicare HMO | Admitting: Anesthesiology

## 2020-12-15 ENCOUNTER — Encounter (HOSPITAL_COMMUNITY)
Admission: RE | Disposition: A | Discharge status: Home / Self Care | Payer: Self-pay | Source: Home / Self Care | Attending: Internal Medicine

## 2020-12-15 ENCOUNTER — Encounter (HOSPITAL_COMMUNITY): Payer: Self-pay

## 2020-12-15 ENCOUNTER — Other Ambulatory Visit: Payer: Self-pay

## 2020-12-15 ENCOUNTER — Ambulatory Visit (HOSPITAL_COMMUNITY)
Admission: RE | Admit: 2020-12-15 | Discharge: 2020-12-15 | Disposition: A | Discharge status: Home / Self Care | Payer: Medicare HMO | Attending: Internal Medicine | Admitting: Internal Medicine

## 2020-12-15 DIAGNOSIS — Z1211 Encounter for screening for malignant neoplasm of colon: Secondary | ICD-10-CM | POA: Insufficient documentation

## 2020-12-15 DIAGNOSIS — Z791 Long term (current) use of non-steroidal anti-inflammatories (NSAID): Secondary | ICD-10-CM | POA: Insufficient documentation

## 2020-12-15 DIAGNOSIS — E119 Type 2 diabetes mellitus without complications: Secondary | ICD-10-CM | POA: Diagnosis not present

## 2020-12-15 DIAGNOSIS — Z79899 Other long term (current) drug therapy: Secondary | ICD-10-CM | POA: Diagnosis not present

## 2020-12-15 DIAGNOSIS — K635 Polyp of colon: Secondary | ICD-10-CM

## 2020-12-15 DIAGNOSIS — I10 Essential (primary) hypertension: Secondary | ICD-10-CM | POA: Insufficient documentation

## 2020-12-15 DIAGNOSIS — K5730 Diverticulosis of large intestine without perforation or abscess without bleeding: Secondary | ICD-10-CM | POA: Insufficient documentation

## 2020-12-15 DIAGNOSIS — F17210 Nicotine dependence, cigarettes, uncomplicated: Secondary | ICD-10-CM | POA: Insufficient documentation

## 2020-12-15 DIAGNOSIS — Z7984 Long term (current) use of oral hypoglycemic drugs: Secondary | ICD-10-CM | POA: Diagnosis not present

## 2020-12-15 DIAGNOSIS — D123 Benign neoplasm of transverse colon: Secondary | ICD-10-CM | POA: Insufficient documentation

## 2020-12-15 DIAGNOSIS — K648 Other hemorrhoids: Secondary | ICD-10-CM | POA: Diagnosis not present

## 2020-12-15 DIAGNOSIS — Z86010 Personal history of colonic polyps: Secondary | ICD-10-CM | POA: Diagnosis not present

## 2020-12-15 DIAGNOSIS — Z7982 Long term (current) use of aspirin: Secondary | ICD-10-CM | POA: Diagnosis present

## 2020-12-15 DIAGNOSIS — Z8249 Family history of ischemic heart disease and other diseases of the circulatory system: Secondary | ICD-10-CM | POA: Insufficient documentation

## 2020-12-15 DIAGNOSIS — Z85850 Personal history of malignant neoplasm of thyroid: Secondary | ICD-10-CM | POA: Insufficient documentation

## 2020-12-15 HISTORY — PX: POLYPECTOMY: SHX5525

## 2020-12-15 HISTORY — PX: COLONOSCOPY WITH PROPOFOL: SHX5780

## 2020-12-15 LAB — GLUCOSE, CAPILLARY: Glucose-Capillary: 102 mg/dL — ABNORMAL HIGH (ref 70–99)

## 2020-12-15 SURGERY — COLONOSCOPY WITH PROPOFOL
Anesthesia: General

## 2020-12-15 MED ORDER — PROPOFOL 10 MG/ML IV BOLUS
INTRAVENOUS | Status: DC | PRN
  Administered 2020-12-15: 50 mg via INTRAVENOUS
  Administered 2020-12-15: 100 mg via INTRAVENOUS

## 2020-12-15 MED ORDER — LACTATED RINGERS IV SOLN: INTRAVENOUS | Status: DC

## 2020-12-15 MED ORDER — LIDOCAINE HCL (CARDIAC) PF 100 MG/5ML IV SOSY
PREFILLED_SYRINGE | INTRAVENOUS | Status: DC | PRN
  Administered 2020-12-15: 50 mg via INTRAVENOUS

## 2020-12-15 NOTE — Anesthesia Procedure Notes (Signed)
Date/Time: 12/15/2020 9:52 AM Performed by: Orlie Dakin, CRNA Pre-anesthesia Checklist: Patient identified, Emergency Drugs available, Suction available and Patient being monitored Patient Re-evaluated:Patient Re-evaluated prior to induction Oxygen Delivery Method: Nasal cannula Induction Type: IV induction Placement Confirmation: positive ETCO2

## 2020-12-15 NOTE — Anesthesia Postprocedure Evaluation (Signed)
Anesthesia Post Note  Patient: Kristina Huang  Procedure(s) Performed: COLONOSCOPY WITH PROPOFOL (N/A ) POLYPECTOMY  Patient location during evaluation: Endoscopy Anesthesia Type: General Level of consciousness: awake and alert and oriented Pain management: pain level controlled Vital Signs Assessment: post-procedure vital signs reviewed and stable Respiratory status: spontaneous breathing and respiratory function stable Cardiovascular status: blood pressure returned to baseline and stable Postop Assessment: no apparent nausea or vomiting Anesthetic complications: no   No complications documented.   Last Vitals:  Vitals:   12/15/20 0822 12/15/20 1006  BP: (!) 159/102 134/72  Pulse: 89   Resp: 15 14  Temp: 36.7 C 36.7 C  SpO2: 100% 96%    Last Pain:  Vitals:   12/15/20 1006  TempSrc: Oral  PainSc: 0-No pain                 Florencio Hollibaugh C Warner [place]

## 2020-12-15 NOTE — Discharge Instructions (Addendum)
Colonoscopy Discharge Instructions  Read the instructions outlined below and refer to this sheet in the next few weeks. These discharge instructions provide you with general information on caring for yourself after you leave the hospital. Your doctor may also give you specific instructions. While your treatment has been planned according to the most current medical practices available, unavoidable complications occasionally occur.   ACTIVITY  You may resume your regular activity, but move at a slower pace for the next 24 hours.   Take frequent rest periods for the next 24 hours.   Walking will help get rid of the air and reduce the bloated feeling in your belly (abdomen).   No driving for 24 hours (because of the medicine (anesthesia) used during the test).    Do not sign any important legal documents or operate any machinery for 24 hours (because of the anesthesia used during the test).  NUTRITION  Drink plenty of fluids.   You may resume your normal diet as instructed by your doctor.   Begin with a light meal and progress to your normal diet. Heavy or fried foods are harder to digest and may make you feel sick to your stomach (nauseated).   Avoid alcoholic beverages for 24 hours or as instructed.  MEDICATIONS  You may resume your normal medications unless your doctor tells you otherwise.  WHAT YOU CAN EXPECT TODAY  Some feelings of bloating in the abdomen.   Passage of more gas than usual.   Spotting of blood in your stool or on the toilet paper.  IF YOU HAD POLYPS REMOVED DURING THE COLONOSCOPY:  No aspirin products for 7 days or as instructed.   No alcohol for 7 days or as instructed.   Eat a soft diet for the next 24 hours.  FINDING OUT THE RESULTS OF YOUR TEST Not all test results are available during your visit. If your test results are not back during the visit, make an appointment with your caregiver to find out the results. Do not assume everything is normal if  you have not heard from your caregiver or the medical facility. It is important for you to follow up on all of your test results.  SEEK IMMEDIATE MEDICAL ATTENTION IF:  You have more than a spotting of blood in your stool.   Your belly is swollen (abdominal distention).   You are nauseated or vomiting.   You have a temperature over 101.   You have abdominal pain or discomfort that is severe or gets worse throughout the day.   Your colonoscopy revealed 1 polyp(s) which I removed successfully. Await pathology results, my office will contact you. I recommend repeating colonoscopy in 5 years for surveillance purposes. You also have diverticulosis and internal hemorrhoids. I would recommend increasing fiber in your diet or adding OTC Benefiber/Metamucil. Be sure to drink at least 4 to 6 glasses of water daily. Follow-up with GI as needed.   I hope you have a great rest of your week!  Charles K. Carver, D.O. Gastroenterology and Hepatology [facility]   Hemorrhoids Hemorrhoids are swollen veins in and around the rectum or anus. There are two types of hemorrhoids:  Internal hemorrhoids. These occur in the veins that are just inside the rectum. They may poke through to the outside and become irritated and painful.  External hemorrhoids. These occur in the veins that are outside the anus and can be felt as a painful swelling or hard lump near the anus. Most hemorrhoids do   not cause serious problems, and they can be managed with home treatments such as diet and lifestyle changes. If home treatments do not help the symptoms, procedures can be done to shrink or remove the hemorrhoids. What are the causes? This condition is caused by increased pressure in the anal area. This pressure may result from various things, including:  Constipation.  Straining to have a bowel movement.  Diarrhea.  Pregnancy.  Obesity.  Sitting for long periods of time.  Heavy lifting  or other activity that causes you to strain.  Anal sex.  Riding a bike for a long period of time. What are the signs or symptoms? Symptoms of this condition include:  Pain.  Anal itching or irritation.  Rectal bleeding.  Leakage of stool (feces).  Anal swelling.  One or more lumps around the anus. How is this diagnosed? This condition can often be diagnosed through a visual exam. Other exams or tests may also be done, such as:  An exam that involves feeling the rectal area with a gloved hand (digital rectal exam).  An exam of the anal canal that is done using a small tube (anoscope).  A blood test, if you have lost a significant amount of blood.  A test to look inside the colon using a flexible tube with a camera on the end (sigmoidoscopy or colonoscopy). How is this treated? This condition can usually be treated at home. However, various procedures may be done if dietary changes, lifestyle changes, and other home treatments do not help your symptoms. These procedures can help make the hemorrhoids smaller or remove them completely. Some of these procedures involve surgery, and others do not. Common procedures include:  Rubber band ligation. Rubber bands are placed at the base of the hemorrhoids to cut off their blood supply.  Sclerotherapy. Medicine is injected into the hemorrhoids to shrink them.  Infrared coagulation. A type of light energy is used to get rid of the hemorrhoids.  Hemorrhoidectomy surgery. The hemorrhoids are surgically removed, and the veins that supply them are tied off.  Stapled hemorrhoidopexy surgery. The surgeon staples the base of the hemorrhoid to the rectal wall. Follow these instructions at home: Eating and drinking  Eat foods that have a lot of fiber in them, such as whole grains, beans, nuts, fruits, and vegetables.  Ask your health care provider about taking products that have added fiber (fiber supplements).  Reduce the amount of fat in  your diet. You can do this by eating low-fat dairy products, eating less red meat, and avoiding processed foods.  Drink enough fluid to keep your urine pale yellow.   Managing pain and swelling  Take warm sitz baths for 20 minutes, 3-4 times a day to ease pain and discomfort. You may do this in a bathtub or using a portable sitz bath that fits over the toilet.  If directed, apply ice to the affected area. Using ice packs between sitz baths may be helpful. ? Put ice in a plastic bag. ? Place a towel between your skin and the bag. ? Leave the ice on for 20 minutes, 2-3 times a day.   General instructions  Take over-the-counter and prescription medicines only as told by your health care provider.  Use medicated creams or suppositories as told.  Get regular exercise. Ask your health care provider how much and what kind of exercise is best for you. In general, you should do moderate exercise for at least 30 minutes on most days of the   week (150 minutes each week). This can include activities such as walking, biking, or yoga.  Go to the bathroom when you have the urge to have a bowel movement. Do not wait.  Avoid straining to have bowel movements.  Keep the anal area dry and clean. Use wet toilet paper or moist towelettes after a bowel movement.  Do not sit on the toilet for long periods of time. This increases blood pooling and pain.  Keep all follow-up visits as told by your health care provider. This is important. Contact a health care provider if you have:  Increasing pain and swelling that are not controlled by treatment or medicine.  Difficulty having a bowel movement, or you are unable to have a bowel movement.  Pain or inflammation outside the area of the hemorrhoids. Get help right away if you have:  Uncontrolled bleeding from your rectum. Summary  Hemorrhoids are swollen veins in and around the rectum or anus.  Most hemorrhoids can be managed with home treatments such as  diet and lifestyle changes.  Taking warm sitz baths can help ease pain and discomfort.  In severe cases, procedures or surgery can be done to shrink or remove the hemorrhoids. This information is not intended to replace advice given to you by your health care provider. Make sure you discuss any questions you have with your health care provider. Document Revised: 12/25/2018 Document Reviewed: 12/18/2017 Elsevier Patient Education  2021 Elsevier Inc.  Diverticulosis  Diverticulosis is a condition that develops when small pouches (diverticula) form in the wall of the large intestine (colon). The colon is where water is absorbed and stool (feces) is formed. The pouches form when the inside layer of the colon pushes through weak spots in the outer layers of the colon. You may have a few pouches or many of them. The pouches usually do not cause problems unless they become inflamed or infected. When this happens, the condition is called diverticulitis. What are the causes? The cause of this condition is not known. What increases the risk? The following factors may make you more likely to develop this condition:  Being older than age 60. Your risk for this condition increases with age. Diverticulosis is rare among people younger than age 30. By age 80, many people have it.  Eating a low-fiber diet.  Having frequent constipation.  Being overweight.  Not getting enough exercise.  Smoking.  Taking over-the-counter pain medicines, like aspirin and ibuprofen.  Having a family history of diverticulosis. What are the signs or symptoms? In most people, there are no symptoms of this condition. If you do have symptoms, they may include:  Bloating.  Cramps in the abdomen.  Constipation or diarrhea.  Pain in the lower left side of the abdomen. How is this diagnosed? Because diverticulosis usually has no symptoms, it is most often diagnosed during an exam for other colon problems. The  condition may be diagnosed by:  Using a flexible scope to examine the colon (colonoscopy).  Taking an X-ray of the colon after dye has been put into the colon (barium enema).  Having a CT scan. How is this treated? You may not need treatment for this condition. Your health care provider may recommend treatment to prevent problems. You may need treatment if you have symptoms or if you previously had diverticulitis. Treatment may include:  Eating a high-fiber diet.  Taking a fiber supplement.  Taking a live bacteria supplement (probiotic).  Taking medicine to relax your colon.   Follow   these instructions at home: Medicines  Take over-the-counter and prescription medicines only as told by your health care provider.  If told by your health care provider, take a fiber supplement or probiotic. Constipation prevention Your condition may cause constipation. To prevent or treat constipation, you may need to:  Drink enough fluid to keep your urine pale yellow.  Take over-the-counter or prescription medicines.  Eat foods that are high in fiber, such as beans, whole grains, and fresh fruits and vegetables.  Limit foods that are high in fat and processed sugars, such as fried or sweet foods.   General instructions  Try not to strain when you have a bowel movement.  Keep all follow-up visits as told by your health care provider. This is important. Contact a health care provider if you:  Have pain in your abdomen.  Have bloating.  Have cramps.  Have not had a bowel movement in 3 days. Get help right away if:  Your pain gets worse.  Your bloating becomes very bad.  You have a fever or chills, and your symptoms suddenly get worse.  You vomit.  You have bowel movements that are bloody or black.  You have bleeding from your rectum. Summary  Diverticulosis is a condition that develops when small pouches (diverticula) form in the wall of the large intestine (colon).  You  may have a few pouches or many of them.  This condition is most often diagnosed during an exam for other colon problems.  Treatment may include increasing the fiber in your diet, taking supplements, or taking medicines. This information is not intended to replace advice given to you by your health care provider. Make sure you discuss any questions you have with your health care provider. Document Revised: 02/25/2019 Document Reviewed: 02/25/2019 Elsevier Patient Education  2021 Elsevier Inc.  Colon Polyps  Colon polyps are tissue growths inside the colon, which is part of the large intestine. They are one of the types of polyps that can grow in the body. A polyp may be a round bump or a mushroom-shaped growth. You could have one polyp or more than one. Most colon polyps are noncancerous (benign). However, some colon polyps can become cancerous over time. Finding and removing the polyps early can help prevent this. What are the causes? The exact cause of colon polyps is not known. What increases the risk? The following factors may make you more likely to develop this condition:  Having a family history of colorectal cancer or colon polyps.  Being older than 75 years of age.  Being younger than 75 years of age and having a significant family history of colorectal cancer or colon polyps or a genetic condition that puts you at higher risk of getting colon polyps.  Having inflammatory bowel disease, such as ulcerative colitis or Crohn's disease.  Having certain conditions passed from parent to child (hereditary conditions), such as: ? Familial adenomatous polyposis (FAP). ? Lynch syndrome. ? Turcot syndrome. ? Peutz-Jeghers syndrome. ? MUTYH-associated polyposis (MAP).  Being overweight.  Certain lifestyle factors. These include smoking cigarettes, drinking too much alcohol, not getting enough exercise, and eating a diet that is high in fat and red meat and low in fiber.  Having had  childhood cancer that was treated with radiation of the abdomen. What are the signs or symptoms? Many times, there are no symptoms. If you have symptoms, they may include:  Blood coming from the rectum during a bowel movement.  Blood in the stool (feces). The   blood may be bright red or very dark in color.  Pain in the abdomen.  A change in bowel habits, such as constipation or diarrhea. How is this diagnosed? This condition is diagnosed with a colonoscopy. This is a procedure in which a lighted, flexible scope is inserted into the opening between the buttocks (anus) and then passed into the colon to examine the area. Polyps are sometimes found when a colonoscopy is done as part of routine cancer screening tests. How is this treated? This condition is treated by removing any polyps that are found. Most polyps can be removed during a colonoscopy. Those polyps will then be tested for cancer. Additional treatment may be needed depending on the results of testing. Follow these instructions at home: Eating and drinking  Eat foods that are high in fiber, such as fruits, vegetables, and whole grains.  Eat foods that are high in calcium and vitamin D, such as milk, cheese, yogurt, eggs, liver, fish, and broccoli.  Limit foods that are high in fat, such as fried foods and desserts.  Limit the amount of red meat, precooked or cured meat, or other processed meat that you eat, such as hot dogs, sausages, bacon, or meat loaves.  Limit sugary drinks.   Lifestyle  Maintain a healthy weight, or lose weight if recommended by your health care provider.  Exercise every day or as told by your health care provider.  Do not use any products that contain nicotine or tobacco, such as cigarettes, e-cigarettes, and chewing tobacco. If you need help quitting, ask your health care provider.  Do not drink alcohol if: ? Your health care provider tells you not to drink. ? You are pregnant, may be pregnant, or  are planning to become pregnant.  If you drink alcohol: ? Limit how much you use to:  0-1 drink a day for women.  0-2 drinks a day for men. ? Know how much alcohol is in your drink. In the [place] one drink equals one 12 oz bottle of beer (355 mL), one 5 oz glass of wine (148 mL), or one 1 oz glass of hard liquor (44 mL). General instructions  Take over-the-counter and prescription medicines only as told by your health care provider.  Keep all follow-up visits. This is important. This includes having regularly scheduled colonoscopies. Talk to your health care provider about when you need a colonoscopy. Contact a health care provider if:  You have new or worsening bleeding during a bowel movement.  You have new or increased blood in your stool.  You have a change in bowel habits.  You lose weight for no known reason. Summary  Colon polyps are tissue growths inside the colon, which is part of the large intestine. They are one type of polyp that can grow in the body.  Most colon polyps are noncancerous (benign), but some can become cancerous over time.  This condition is diagnosed with a colonoscopy.  This condition is treated by removing any polyps that are found. Most polyps can be removed during a colonoscopy. This information is not intended to replace advice given to you by your health care provider. Make sure you discuss any questions you have with your health care provider. Document Revised: 11/17/2019 Document Reviewed: 11/17/2019 Elsevier Patient Education  2021 Elsevier Inc.  

## 2020-12-15 NOTE — H&P (Signed)
Primary Care Physician:  Abran Richard, MD Primary Gastroenterologist:  Dr. Abbey Chatters  Pre-Procedure History & Physical: HPI:  Kristina Huang is a 75 y.o. female is here for a colonoscopy to be performed for surveillance purposes.  Last TCS done 10/24/2017 by Dr. Oneida Alar, 1 serrated adenoma and 3 simple adenomas, hx of colonic polyps. Patient denies any family history of colorectal cancer.  No melena or hematochezia.  No abdominal pain or unintentional weight loss.  No change in bowel habits.  Overall feels well from a GI standpoint.  Past Medical History:  Diagnosis Date  . Acid reflux   . Arthritis   . Cancer ([place])    Thyroid  . Diabetes mellitus without complication (HCC)    fasting 100-130  . Hypercholesterolemia   . Hypertension   . 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 BACK SURGERY    . BUNIONECTOMY Bilateral   . CHOLECYSTECTOMY     Dr. Irving Shows  . COLONOSCOPY  2009   Dr. Oneida Alar: moderate internal hemorrhoids, frequent sigmoid colon diverticula, polyps X 3, one large adenoma otherwise hyperplastic  . COLONOSCOPY N/A 09/09/2014   Procedure: COLONOSCOPY;  Surgeon: Danie Binder, MD;  [facility]: [facility];  Service: Endoscopy;  Laterality: N/A;  200 - moved to 1/29 @ 12:45  . COLONOSCOPY N/A 10/24/2017   Procedure: COLONOSCOPY;  Surgeon: Danie Binder, MD;  Location: [facility];  Service: Endoscopy;  Laterality: N/A;  9:30  . ESOPHAGOGASTRODUODENOSCOPY N/A 09/09/2014   Procedure: ESOPHAGOGASTRODUODENOSCOPY (EGD);  Surgeon: Danie Binder, MD;  Location: [facility];  Service: Endoscopy;  Laterality: N/A;  . EYE SURGERY Right   . JOINT REPLACEMENT    . LAMINECTOMY  1997  . MAXIMUM ACCESS (MAS)POSTERIOR LUMBAR INTERBODY FUSION (PLIF) 2 LEVEL  11/2013   POSTERIOR LUMBAR INTERBODY FUSION POSTERIOR LATERAL ARTHRODESIS POSTERIOR NONSEGMENTAL INSTRUMENTATION  LUMBAR TWO-THREE ,LUMBAR THREE-FOUR  WITH HARDWARE REMOVAL LUMBAR FOUR-FIVE/notes 11/17/2013  . THYROIDECTOMY N/A 02/05/2017   Procedure: THYROIDECTOMY;  Surgeon: Aviva Signs, MD;  Location: [facility];  Service: General;  Laterality: N/A;  . TOTAL HIP ARTHROPLASTY Left 07/21/2018  . TOTAL HIP ARTHROPLASTY Left 07/21/2018   Procedure: LEFT TOTAL HIP ARTHROPLASTY ANTERIOR APPROACH;  Surgeon: Mcarthur Rossetti, MD;  Location: [place];  Service: Orthopedics;  Laterality: Left;  . TOTAL HIP ARTHROPLASTY Right 05/04/2019   Procedure: RIGHT TOTAL HIP ARTHROPLASTY ANTERIOR APPROACH;  Surgeon: Mcarthur Rossetti, MD;  Location: [place];  Service: Orthopedics;  Laterality: Right;  . TUBAL LIGATION      Prior to Admission medications   Medication Sig Start Date End Date Taking? Authorizing Provider  acetaminophen (TYLENOL) 650 MG CR tablet Take 650-1,300 mg by mouth every 8 (eight) hours as needed for pain.   Yes [provider]  aspirin EC 81 MG tablet Take 81 mg by mouth every evening. Swallow whole.   Yes [provider]  atorvastatin (LIPITOR) 20 MG tablet Take 20 mg by mouth every evening. 10/06/20  Yes [provider]  diclofenac (VOLTAREN) 75 MG EC tablet Take 75 mg by mouth 2 (two) times daily.   Yes [provider]  levothyroxine (EUTHYROX) 100 MCG tablet Take 1 tablet (100 mcg total) by mouth daily before breakfast. 11/16/20  Yes Nida, Marella Chimes, MD  losartan (COZAAR) 100 MG tablet Take 100 mg by mouth every evening. 10/06/20  Yes [provider]  metFORMIN (GLUCOPHAGE)  500 MG tablet Take 500 mg by mouth daily with breakfast.   Yes [provider]  omeprazole (PRILOSEC) 20 MG capsule Take 20 mg by mouth in the morning.   Yes [provider]  polyethylene glycol-electrolytes (NULYTELY) 420 g solution Take 4,000 mLs by mouth as directed. 11/13/20  Yes [provider]  tiZANidine (ZANAFLEX) 2 MG tablet Take 1 tablet (2 mg total) by mouth at bedtime. Patient  taking differently: Take 2 mg by mouth daily as needed for muscle spasms. 11/26/20  Yes Scot Jun, FNP  ACCU-CHEK AVIVA PLUS test strip  07/02/18   [provider]  ACCU-CHEK SOFTCLIX LANCETS lancets  07/02/18   [provider]    Allergies as of 11/13/2020  . (No Known Allergies)    Family History  Problem Relation Age of Onset  . Heart disease Mother   . Heart disease Father   . Colon cancer Neg Hx     Social History   Socioeconomic History  . Marital status: Divorced    Spouse name: Not on file  . Number of children: Not on file  . Years of education: Not on file  . Highest education level: Not on file  Occupational History  . Not on file  Tobacco Use  . Smoking status: Current Some Day Smoker    Packs/day: 0.25    Years: 2.00    Pack years: 0.50    Types: Cigarettes    Last attempt to quit: 01/31/2010    Years since quitting: 10.8  . Smokeless tobacco: Never Used  Vaping Use  . Vaping Use: Never used  Substance and Sexual Activity  . Alcohol use: No    Alcohol/week: 0.0 standard drinks  . Drug use: No  . Sexual activity: Not Currently    Birth control/protection: Surgical  Other Topics Concern  . Not on file  Social History Narrative  . Not on file   Social Determinants of Health   Financial Resource Strain: Not on file  Food Insecurity: Not on file  Transportation Needs: Not on file  Physical Activity: Not on file  Stress: Not on file  Social Connections: Not on file  Intimate Partner Violence: Not on file    Review of Systems: See HPI, otherwise negative ROS  Physical Exam: Vital signs in last 24 hours: Temp:  [98.1 F (36.7 C)] 98.1 F (36.7 C) (05/06 0822) Pulse Rate:  [89] 89 (05/06 0822) Resp:  [15] 15 (05/06 0822) BP: (159)/(102) 159/102 (05/06 0822) SpO2:  [100 %] 100 % (05/06 0822) Weight:  [70.3 kg] 70.3 kg (05/06 0822)   General:   Alert,  Well-developed, well-nourished, pleasant and cooperative in  NAD Head:  Normocephalic and atraumatic. Eyes:  Sclera clear, no icterus.   Conjunctiva pink. Ears:  Normal auditory acuity. Nose:  No deformity, discharge,  or lesions. Mouth:  No deformity or lesions, dentition normal. Neck:  Supple; no masses or thyromegaly. Lungs:  Clear throughout to auscultation.   No wheezes, crackles, or rhonchi. No acute distress. Heart:  Regular rate and rhythm; no murmurs, clicks, rubs,  or gallops. Abdomen:  Soft, nontender and nondistended. No masses, hepatosplenomegaly or hernias noted. Normal bowel sounds, without guarding, and without rebound.   Msk:  Symmetrical without gross deformities. Normal posture. Extremities:  Without clubbing or edema. Neurologic:  Alert and  oriented x4;  grossly normal neurologically. Skin:  Intact without significant lesions or rashes. Cervical Nodes:  No significant cervical adenopathy. Psych:  Alert and cooperative.  Normal mood and affect.  Impression/Plan: Kristina Huang is here for a colonoscopy to be performed for surveillance purposes.  Last TCS done 10/24/2017 by Dr. Oneida Alar, 1 serrated adenoma and 3 simple adenomas, hx of colonic polyps  The risks of the procedure including infection, bleed, or perforation as well as benefits, limitations, alternatives and imponderables have been reviewed with the patient. Questions have been answered. All parties agreeable.

## 2020-12-15 NOTE — Anesthesia Preprocedure Evaluation (Addendum)
Anesthesia Evaluation  Patient identified by MRN, date of birth, ID band Patient awake    Reviewed: Allergy & Precautions, NPO status , Patient's Chart, lab work & pertinent test results  Airway Mallampati: II  TM Distance: >3 FB Neck ROM: Full    Dental  (+) Dental Advisory Given, Missing   Pulmonary Current SmokerPatient did not abstain from smoking.,    Pulmonary exam normal breath sounds clear to auscultation       Cardiovascular Exercise Tolerance: Good hypertension, Pt. on medications Normal cardiovascular exam Rhythm:Regular Rate:Normal     Neuro/Psych negative neurological ROS  negative psych ROS   GI/Hepatic Neg liver ROS, GERD  Medicated,  Endo/Other  diabetes, Well Controlled, Type 2Hypothyroidism   Renal/GU negative Renal ROS     Musculoskeletal  (+) Arthritis ,   Abdominal   Peds  Hematology   Anesthesia Other Findings   Reproductive/Obstetrics                            Anesthesia Physical Anesthesia Plan  ASA: II  Anesthesia Plan: General   Post-op Pain Management:    Induction: Intravenous  PONV Risk Score and Plan: Propofol infusion  Airway Management Planned: Nasal Cannula and Natural Airway  Additional Equipment:   Intra-op Plan:   Post-operative Plan:   Informed Consent:   Plan Discussed with:   Anesthesia Plan Comments:         Anesthesia Quick Evaluation

## 2020-12-15 NOTE — Transfer of Care (Signed)
Immediate Anesthesia Transfer of Care Note  Patient: Kristina Huang  Procedure(s) Performed: COLONOSCOPY WITH PROPOFOL (N/A ) POLYPECTOMY  Patient Location: Endoscopy Unit  Anesthesia Type:General  Level of Consciousness: awake  Airway & Oxygen Therapy: Patient Spontanous Breathing  Post-op Assessment: Report given to RN and Post -op Vital signs reviewed and stable  Post vital signs: Reviewed and stable  Last Vitals:  Vitals Value Taken Time  BP 134/72 12/15/20 1006  Temp 36.7 C 12/15/20 1006  Pulse    Resp 14 12/15/20 1006  SpO2 96 % 12/15/20 1006    Last Pain:  Vitals:   12/15/20 1006  TempSrc: Oral  PainSc: 0-No pain      Patients Stated Pain Goal: 9 (02/58/52 7782)  Complications: No complications documented.

## 2020-12-15 NOTE — Op Note (Signed)
[facility] Patient Name: Kristina Huang Procedure Date: 12/15/2020 9:37 AM MRN: [id number] Date of Birth: Jun 22, 1946 Attending MD: Elon Alas. Abbey Chatters DO CSN: [id number] Age: 75 Admit Type: Outpatient Procedure:                Colonoscopy Indications:              Surveillance: Personal history of adenomatous                            polyps on last colonoscopy 3 years ago Providers:                Elon Alas. Abbey Chatters, DO, Gwenlyn Fudge, RN, Aram Candela Referring MD:              Medicines:                See the Anesthesia note for documentation of the                            administered medications Complications:            No immediate complications. Estimated Blood Loss:     Estimated blood loss was minimal. Procedure:                Pre-Anesthesia Assessment:                           - The anesthesia plan was to use monitored                            anesthesia care (MAC).                           After obtaining informed consent, the colonoscope                            was passed under direct vision. Throughout the                            procedure, the patient's blood pressure, pulse, and                            oxygen saturations were monitored continuously. The                            [id number]-[id number] ([id number]) was introduced through the                            anus and advanced to the the cecum, identified by                            appendiceal orifice and ileocecal valve. The                            colonoscopy was performed without difficulty. The  patient tolerated the procedure well. The quality                            of the bowel preparation was evaluated using the                            BBPS [facility] Bowel Preparation Scale) with scores                            of: Right Colon = 3, Transverse Colon = 3 and Left                            Colon = 3 (entire mucosa seen well with no  residual                            staining, small fragments of stool or opaque                            liquid). The total BBPS score equals 9. Scope In: 9:48:44 AM Scope Out: 10:02:44 AM Scope Withdrawal Time: 0 hours 10 minutes 34 seconds  Total Procedure Duration: 0 hours 14 minutes 0 seconds  Findings:      The perianal and digital rectal examinations were normal.      Non-bleeding internal hemorrhoids were found during endoscopy.      Multiple small-mouthed diverticula were found in the sigmoid colon and       descending colon.      A 2 mm polyp was found in the transverse colon. The polyp was sessile.       The polyp was removed with a cold biopsy forceps. Resection and       retrieval were complete.      A tattoo was seen at the hepatic flexure. The tattoo site appeared       normal. Impression:               - Non-bleeding internal hemorrhoids.                           - Diverticulosis in the sigmoid colon and in the                            descending colon.                           - One 2 mm polyp in the transverse colon, removed                            with a cold biopsy forceps. Resected and retrieved.                           - A tattoo was seen at the hepatic flexure. The                            tattoo site appeared normal. Moderate Sedation:      Per Anesthesia Care Recommendation:           -  Patient has a contact number available for                            emergencies. The signs and symptoms of potential                            delayed complications were discussed with the                            patient. Return to normal activities tomorrow.                            Written discharge instructions were provided to the                            patient.                           - Resume previous diet.                           - Continue present medications.                           - Await pathology results.                            - Repeat colonoscopy in 5 years for surveillance.                           - Return to GI clinic PRN. Procedure Code(s):        --- Professional ---                           [phone], Colonoscopy, flexible; with biopsy, single                            or multiple Diagnosis Code(s):        --- Professional ---                           Z86.010, Personal history of colonic polyps                           K64.8, Other hemorrhoids                           K63.5, Polyp of colon                           K57.30, Diverticulosis of large intestine without                            perforation or abscess without bleeding CPT copyright 2019 American Medical Association. All rights reserved. The codes documented in this report are preliminary and upon coder review may  be revised to meet current compliance requirements. Elon Alas. Abbey Chatters, DO  Elon Alas. [place], DO 12/15/2020 10:07:00 AM This report has been signed electronically. Number of Addenda: 0

## 2020-12-18 LAB — SURGICAL PATHOLOGY

## 2020-12-20 ENCOUNTER — Ambulatory Visit (HOSPITAL_COMMUNITY)
Admission: RE | Admit: 2020-12-20 | Discharge: 2020-12-20 | Disposition: A | Discharge status: Home / Self Care | Payer: Medicare HMO | Source: Ambulatory Visit | Attending: Internal Medicine | Admitting: Internal Medicine

## 2020-12-20 DIAGNOSIS — Z1231 Encounter for screening mammogram for malignant neoplasm of breast: Secondary | ICD-10-CM | POA: Insufficient documentation

## 2020-12-21 ENCOUNTER — Encounter (HOSPITAL_COMMUNITY): Payer: Self-pay | Admitting: Internal Medicine

## 2021-01-19 NOTE — Progress Notes (Signed)
Recall placed

## 2021-04-19 ENCOUNTER — Other Ambulatory Visit: Payer: Self-pay

## 2021-04-19 ENCOUNTER — Ambulatory Visit (HOSPITAL_COMMUNITY)
Admission: RE | Admit: 2021-04-19 | Discharge: 2021-04-19 | Disposition: A | Discharge status: Home / Self Care | Payer: Medicare HMO | Source: Ambulatory Visit | Attending: "Endocrinology | Admitting: "Endocrinology

## 2021-04-19 DIAGNOSIS — C73 Malignant neoplasm of thyroid gland: Secondary | ICD-10-CM | POA: Insufficient documentation

## 2021-05-11 ENCOUNTER — Other Ambulatory Visit: Payer: Self-pay | Admitting: "Endocrinology

## 2021-05-11 ENCOUNTER — Telehealth: Payer: Self-pay

## 2021-05-11 MED ORDER — LEVOTHYROXINE SODIUM 100 MCG PO TABS: 100.0000 ug | ORAL_TABLET | Freq: Every day | ORAL | 1 refills | Status: DC

## 2021-05-11 NOTE — Telephone Encounter (Signed)
Pt said she has enough Euthyrox to get her until Wednesday. She said she did her blood work. Can you call her RX in? She has an appt on 10/7   Please send to [facility]

## 2021-05-15 LAB — THYROGLOBULIN LEVEL: Thyroglobulin (TG-RIA): <2 ng/mL

## 2021-05-15 LAB — T4, FREE: Free T4: 1.68 ng/dL (ref 0.82–1.77)

## 2021-05-15 LAB — TSH: TSH: 1.61 u[IU]/mL (ref 0.450–4.500)

## 2021-05-18 ENCOUNTER — Ambulatory Visit (INDEPENDENT_AMBULATORY_CARE_PROVIDER_SITE_OTHER): Payer: Medicare HMO | Admitting: "Endocrinology

## 2021-05-18 ENCOUNTER — Other Ambulatory Visit: Payer: Self-pay

## 2021-05-18 ENCOUNTER — Encounter: Payer: Self-pay | Admitting: "Endocrinology

## 2021-05-18 VITALS — BP 136/86 | HR 84 | Ht 63.0 in | Wt 158.8 lb

## 2021-05-18 DIAGNOSIS — C73 Malignant neoplasm of thyroid gland: Secondary | ICD-10-CM

## 2021-05-18 DIAGNOSIS — E890 Postprocedural hypothyroidism: Secondary | ICD-10-CM | POA: Diagnosis not present

## 2021-05-18 MED ORDER — LEVOTHYROXINE SODIUM 100 MCG PO TABS: 100.0000 ug | ORAL_TABLET | Freq: Every day | ORAL | 1 refills | Status: DC

## 2021-05-18 NOTE — Progress Notes (Signed)
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 Irving Shows   COLONOSCOPY  2009   Dr. Oneida Alar: moderate internal hemorrhoids, frequent sigmoid colon diverticula, polyps X 3, one large adenoma otherwise hyperplastic   COLONOSCOPY N/A 09/09/2014   Procedure: COLONOSCOPY;  Surgeon: Danie Binder, MD;  [facility];  Service: Endoscopy;  Laterality: N/A;  200 - moved to 1/29 @ 12:45   COLONOSCOPY N/A 10/24/2017   Procedure: COLONOSCOPY;  Surgeon: Danie Binder, MD;  Location: [facility];  Service: Endoscopy;  Laterality: N/A;  9:30   COLONOSCOPY WITH PROPOFOL N/A 12/15/2020   Procedure: COLONOSCOPY WITH PROPOFOL;  Surgeon: Eloise Harman, DO;  [facility];  Service: Endoscopy;  Laterality: N/A;  ASA II / AM procedure   ESOPHAGOGASTRODUODENOSCOPY N/A 09/09/2014   Procedure: ESOPHAGOGASTRODUODENOSCOPY (EGD);  Surgeon: Danie Binder, MD;  [facility];  Service: Endoscopy;  Laterality: N/A;   EYE SURGERY Right    JOINT REPLACEMENT     LAMINECTOMY  1997   MAXIMUM ACCESS (MAS)POSTERIOR LUMBAR INTERBODY FUSION (PLIF) 2 LEVEL  11/2013   POSTERIOR LUMBAR INTERBODY FUSION POSTERIOR LATERAL ARTHRODESIS POSTERIOR NONSEGMENTAL INSTRUMENTATION  LUMBAR TWO-THREE ,LUMBAR THREE-FOUR WITH HARDWARE REMOVAL LUMBAR FOUR-FIVE/notes 11/17/2013   POLYPECTOMY  12/15/2020   Procedure: POLYPECTOMY;  Surgeon: Eloise Harman, DO;  [facility];   Service: Endoscopy;;   THYROIDECTOMY N/A 02/05/2017   Procedure: THYROIDECTOMY;  Surgeon: Aviva Signs, MD;  Location: [facility];  Service: General;  Laterality: N/A;   TOTAL HIP ARTHROPLASTY Left 07/21/2018   TOTAL HIP ARTHROPLASTY Left 07/21/2018   Procedure: LEFT TOTAL HIP ARTHROPLASTY ANTERIOR APPROACH;  Surgeon: Mcarthur Rossetti, MD;  Location: [place];  Service: Orthopedics;  Laterality: Left;   TOTAL HIP ARTHROPLASTY Right 05/04/2019   Procedure: RIGHT TOTAL HIP ARTHROPLASTY ANTERIOR APPROACH;  Surgeon: Mcarthur Rossetti, MD;  Location: [place];  Service: Orthopedics;  Laterality: Right;   TUBAL LIGATION     Social History   Socioeconomic History   Marital status: Divorced    Spouse name: Not on file   Number of children: Not on file   Years of education: Not on file   Highest education level: Not on file  Occupational History   Not on file  Tobacco Use   Smoking status: Some Days    Packs/day: 0.25    Years: 2.00    Pack years: 0.50    Types: Cigarettes    Last attempt to quit: 01/31/2010    Years since quitting: 11.3   Smokeless tobacco: Never  Vaping Use   Vaping Use: Never used  Substance and Sexual  Activity   Alcohol use: No    Alcohol/week: 0.0 standard drinks   Drug use: No   Sexual activity: Not Currently    Birth control/protection: Surgical  Other Topics Concern   Not on file  Social History Narrative   Not on file   Social Determinants of Health   Financial Resource Strain: Not on file  Food Insecurity: Not on file  Transportation Needs: Not on file  Physical Activity: Not on file  Stress: Not on file  Social Connections: Not on file   Outpatient Encounter Medications as of 05/18/2021  Medication Sig   ACCU-CHEK AVIVA PLUS test strip    ACCU-CHEK SOFTCLIX LANCETS lancets    acetaminophen (TYLENOL) 650 MG CR tablet Take 650-1,300 mg by mouth every 8 (eight) hours as needed for pain.   aspirin EC 81 MG tablet Take 81 mg by mouth every  evening. Swallow whole.   atorvastatin (LIPITOR) 20 MG tablet Take 20 mg by mouth every evening.   diclofenac (VOLTAREN) 75 MG EC tablet Take 75 mg by mouth 2 (two) times daily.   levothyroxine (EUTHYROX) 100 MCG tablet Take 1 tablet (100 mcg total) by mouth daily before breakfast.   losartan (COZAAR) 100 MG tablet Take 100 mg by mouth every evening.   metFORMIN (GLUCOPHAGE) 500 MG tablet Take 500 mg by mouth daily with breakfast.   omeprazole (PRILOSEC) 20 MG capsule Take 20 mg by mouth in the morning.   polyethylene glycol-electrolytes (NULYTELY) 420 g solution Take 4,000 mLs by mouth as directed.   tiZANidine (ZANAFLEX) 2 MG tablet Take 1 tablet (2 mg total) by mouth at bedtime. (Patient not taking: Reported on 05/18/2021)   [DISCONTINUED] levothyroxine (EUTHYROX) 100 MCG tablet Take 1 tablet (100 mcg total) by mouth daily before breakfast.   No facility-administered encounter medications on file as of 05/18/2021.   ALLERGIES: No Known Allergies  VACCINATION STATUS: Immunization History  Administered Date(s) Administered   Fluad Quad(high Dose 65+) 05/06/2019   Pneumococcal Polysaccharide-23 05/06/2019    HPI  75 year old female patient with medical history as above.    She is returning for follow-up for history of thyroid malignancy and postsurgical hypothyroidism.  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 The left lobe was said to be consistent with hyperplastic nodule. - She underwent I-131 thyroid remnant ablation on 04/15/2017 followed by post therapy whole body scan. -  On the scan, she was found to have foci of abnormal radioiodine accumulation in the cervical region, suggestive of thyroid remnant or metastatic cervical lymph node.  -Her subsequent thyroid/neck ultrasound   confirms decrease in size of this lymph node to 0.3 cm and no new concerns.  -She denies dysphagia, shortness of breath, nor voice change.  Her subsequent thyroid/neck ultrasound is negative for any residual thyroid tissue nor cervical adenopathy, in September 2020.  Prior to her October 2021 visit , in December 06, 2019 she underwent Thyrogen stimulated whole-body scan which did not show any evidence of tumor recurrence or distant metastasis.  Her most recent thyroid/neck ultrasound on April 19, 2021 did not show any evidence of tumor recurrence.  -She is currently on levothyroxine 100 mcg p.o. daily before breakfast.  She is compliant.  Her previsit labs are consistent with slight over replacement.    - She denies any family history of thyroid  cancer, however one of her sisters have hyperthyroidism status post treatment with what appears to be radioactive iodine therapy.    Objective:    BP 136/86   Pulse 84   Ht 5\' 3"  (1.6 m)   Wt 158 lb 12.8 oz (72 kg)   BMI 28.13 kg/m   Wt Readings from Last 3 Encounters:  05/18/21 158 lb 12.8 oz (72 kg)  12/15/20 155 lb (70.3 kg)  11/16/20 155 lb 6.4 oz (70.5 kg)       CMP ( most recent) CMP     Component Value Date/Time   NA 137 05/07/2019 0355   K 3.3 (L) 05/07/2019 0355   CL 104 05/07/2019 0355   CO2 26 05/07/2019 0355   GLUCOSE 128 (H) 05/07/2019 0355   BUN 8 05/07/2019 0355   CREATININE 0.59 05/07/2019 0355   CREATININE 0.80 03/26/2018 1041   CALCIUM 8.3 (L) 05/07/2019 0355   PROT 7.2 03/26/2018 1041   ALBUMIN 3.2 (L) 02/06/2017 0444   AST 16 03/26/2018 1041   ALT 15 03/26/2018 1041   ALKPHOS 68 02/06/2017 0444   BILITOT 0.4 03/26/2018 1041   GFRNONAA >60 05/07/2019 0355   GFRNONAA 74 03/26/2018 1041   GFRAA >60 05/07/2019 0355   GFRAA 85 03/26/2018 1041        Fine-needle aspiration cytology of the thyroid on 10/23/2016 showed: THYROID, FINE NEEDLE ASPIRATION, LEFT (SPECIMEN 1 OF 1, COLLECTED  10/23/16): CONSISTENT WITH BENIGN FOLLICULAR NODULE (BETHESDA CATEGORY II).  02/05/2017: She underwent total thyroidectomy: 02/05/2017: INAL DIAGNOSIS Diagnosis 1. Thyroid, lobectomy, left - NODULAR HYPERPLASIA (MULTINODULAR GOITER). 2. Thyroid, lobectomy, right - PAPILLARY THYROID CARCINOMA, TWO TUMORS, 0.5 AND 0.2 CM. - PAPILLARY CARCINOMA FOCALLY EXTENDS INTO THE PERITHYROIDAL CONNECTIVE TISSUE. - HURTHLE CELL ADENOMATOUS NODULE. - NODULAR HYPERPLASIA.  TNM code: pT1a , pNX.   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   No distant sites of iodine-avid metastatic thyroid cancer Identified.  Surveillance thyroid/neck ultrasound from 05/19/2017 showed:  Surgically absent thyroid lobes. A single morphologically unremarkable 0.8 cm left cervical lymph node is noted. No pathologic adenopathy.   IMPRESSION: No evidence of residual/ recurrent tissue post thyroidectomy   Thyroid/neck ultrasound on February 23, 2018 IMPRESSION: 1. Post total thyroidectomy without evidence of residual nodular tissue within the thyroidectomy resection bed. 2. Solitary left-sided cervical lymph node is not enlarged by size criteria measuring 0.3 cm in diameter. No definitive bulky regional cervical lymphadenopathy.     Thyroid/neck ultrasound on April 28, 2019 No discrete nodules are seen within the thyroidectomy bed. No regional adenopathy identified.    IMPRESSION: 1. No residual/recurrent tissue post thyroidectomy. No adenopathy identified.    Thyrogen stimulated whole-body scan on December 06, 2019  Resolution of previously identified abnormal uptake in the right cervical region.  No scintigraphic evidence of iodine - avid metastatic thyroid cancer.  April 19, 2021 thyroid/neck ultrasound FINDINGS: No residual thyroid tissue.  No enlarged neck lymph nodes.    IMPRESSION: No evidence of recurrent or residual thyroid malignancy.  Recent Results (from the past 2160 hour(s))  TSH     Status: None   Collection Time: 05/09/21 10:33 AM  Result Value Ref Range   TSH 1.610 0.450 - 4.500 uIU/mL  T4, free     Status: None   Collection Time: 05/09/21 10:33 AM  Result Value Ref Range   Free T4 1.68 0.82 - 1.77 ng/dL  Thyroglobulin Level     Status: None   Collection Time: 05/09/21 10:33 AM  Result Value Ref Range   Thyroglobulin (TG-RIA) <2.0 ng/mL    Comment: This test was developed and its performance characteristics determined by [facility]. It has not been cleared or approved by the Food and Drug Administration. Reference Range: Pubertal Children and Adults: <40 According to the [facility] of Clinical Biochemistry, the reference interval for Thyroglobulin (TG) should be related to euthyroid patients and not for patients who underwent thyroidectomy.  TG reference intervals for these patients depend on the residual mass of the thyroid tissue left after surgery.  Establishing a post-operative baseline is recommended.  The assay quantitation limit is 2.0 ng/mL.      Assessment & Plan:   1. 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 Her surgical sample unexpectedly revealed occult malignancy on the right lobe of the thyroid, 0.5 cm  with focal extrathyroidal extension and 0.2 cm foci.  TNM code: pT1a , pNX. - She is status post total thyroidectomy and I-131 thyroid remnant ablation on 04/15/2017.  - Thyroglobulin level and thyroglobulin antibodies are  low  on the day of or body scan. 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   No distant sites of iodine-avid metastatic thyroid cancer Identified.  -  Surveillance  thyroid/neck ultrasound - is unremarkable except for nonspecific 0.8 cm lymph node on the left cervical region.   Subsequent thyroid/neck ultrasound on February 23, 2018 showed decreasing size of his nonspecific lymph node to 0.3 cm. -Her most recent thyroid/neck ultrasound is negative for residual thyroid tissue nor thyroid lymphadenopathy.    -Her previsit Thyrogen stimulated whole-body scan-is negative for iodine avid distant metastasis, resolution of previously documented uptake in the cervical region.   -Her previsit thyroid/neck ultrasound did not show any evidence of tumor recurrence.     2. Postsurgical hypothyroidism  Her previsit thyroid function tests are consistent with appropriate replacement.  She is advised to continue  levothyroxine  100 mcg p.o. daily before breakfast.  - The goal of therapy in thyroid cancer suppressive therapy is to keep TSH between 0.1-0.5 .   - We discussed about the correct intake of her thyroid hormone, on empty stomach at fasting, with water, separated by at least 30 minutes from breakfast and other medications,  and separated by more than 4 hours from calcium, iron, multivitamins, acid reflux medications (PPIs). -Patient is made aware of the fact that thyroid hormone replacement is needed for life, dose to be adjusted by periodic monitoring of thyroid function tests.   - I advised patient to maintain close follow up with Abran Richard, MD for primary care needs.    I spent 20 minutes in the care of the patient today including review of labs from Thyroid Function, CMP, and other relevant labs ; imaging/biopsy records (current and previous including abstractions from other facilities); face-to-face time discussing  her lab results and symptoms, medications doses, her options of short and long term treatment based on the latest standards of care / guidelines;   and documenting the encounter.  Kristina Huang  participated in the  discussions, expressed understanding, and voiced agreement with the above plans.  All questions were answered to her satisfaction. she is encouraged to contact clinic should  she have any questions or concerns prior to her return visit.    Follow up plan: Return in about 6 months (around 11/16/2021).  Glade Lloyd, MD Phone: [phone]  Fax: [phone]  This note was partially dictated with voice recognition software. Similar sounding words can be transcribed inadequately or may not  be corrected upon review.  05/18/2021, 9:19 AM

## 2021-08-19 ENCOUNTER — Other Ambulatory Visit: Payer: Self-pay

## 2021-08-19 ENCOUNTER — Ambulatory Visit
Admission: EM | Admit: 2021-08-19 | Discharge: 2021-08-19 | Disposition: A | Discharge status: Home / Self Care | Payer: Medicare HMO | Attending: Student | Admitting: Student

## 2021-08-19 DIAGNOSIS — A084 Viral intestinal infection, unspecified: Secondary | ICD-10-CM

## 2021-08-19 MED ORDER — LOPERAMIDE HCL 2 MG PO CAPS: 2.0000 mg | ORAL_CAPSULE | Freq: Four times a day (QID) | ORAL | 0 refills | Status: DC | PRN

## 2021-08-19 MED ORDER — ONDANSETRON 8 MG PO TBDP: 8.0000 mg | ORAL_TABLET | Freq: Three times a day (TID) | ORAL | 0 refills | Status: DC | PRN

## 2021-08-19 NOTE — Discharge Instructions (Addendum)
-  Take the Zofran (ondansetron) up to 3 times daily for nausea and vomiting. Dissolve one pill under your tongue or between your teeth and your cheek. -Take the Imodium (loperamide) up to 4 times daily for diarrhea. -Drink plenty of fluids! -Follow-up if you can't keep fluids down despite treatment; you develop new symptoms like dizziness, shortness of breath, fevers, chest pain, etc.

## 2021-08-19 NOTE — ED Provider Notes (Signed)
[facility]    CSN: [id number] Arrival date & time: 08/19/21  1218      History   Chief Complaint Chief Complaint  Patient presents with   Weakness   Diarrhea    HPI Kristina Huang is a 76 y.o. female describes diarrhea x3 days. History prediabetes. States she has had similar stomach issues in the past. Nausea without vomiting. Frequent watery diarrhea frequently throughout the day, unable to quantify this, seems worse after eating. Denies BRBPR, black or tarry stool. Denies abd pain. Tolerating fluids but decreased appetite, states she doesn't think she's getting enough fluids. Hasn't taken any medications for this symptoms. Generalized weakness without focal component. Denies fevers/chills, SOB, CP, dizziness, cough, n/v/d/c. Denies sick contacts. Denies recent travel. Denies recent abx. Attributes elevated respirations to wearing mask, denies history pulm ds.  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/04/2019   Unilateral primary osteoarthritis, right hip 03/09/2019   Vitamin D deficiency 08/13/2018   Prediabetes 08/13/2018   Unilateral primary osteoarthritis, left hip 07/21/2018   Status post total replacement of left hip 07/21/2018   Malignant neoplasm of thyroid gland ([place]) 03/18/2017   Postsurgical hypothyroidism 03/18/2017   Multinodular goiter 12/10/2016   Personal history of colonic polyps 08/23/2014   GERD (gastroesophageal reflux disease) 08/23/2014   Chronic diarrhea 08/23/2014   Spondylolisthesis at L3-L4 level 11/17/2013   COLONIC POLYPS, HYPERPLASTIC 02/03/2009   HYPERLIPIDEMIA 02/03/2009   HYPERTENSION 02/03/2009    GERD 02/03/2009   ARTHRITIS 02/03/2009    Past Surgical History:  Procedure Laterality Date   BACK SURGERY     BUNIONECTOMY Bilateral    CHOLECYSTECTOMY     Dr. Irving Shows   COLONOSCOPY  2009   Dr. Oneida Alar: moderate internal hemorrhoids, frequent sigmoid colon diverticula, polyps X 3, one large adenoma otherwise hyperplastic   COLONOSCOPY N/A 09/09/2014   Procedure: COLONOSCOPY;  Surgeon: Danie Binder, MD;  [facility];  Service: Endoscopy;  Laterality: N/A;  200 - moved to 1/29 @ 12:45   COLONOSCOPY N/A 10/24/2017   Procedure: COLONOSCOPY;  Surgeon: Danie Binder, MD;  [facility];  Service: Endoscopy;  Laterality: N/A;  9:30   COLONOSCOPY WITH PROPOFOL N/A 12/15/2020   Procedure: COLONOSCOPY WITH PROPOFOL;  Surgeon: Eloise Harman, DO;  [facility];  Service: Endoscopy;  Laterality: N/A;  ASA II / AM procedure   ESOPHAGOGASTRODUODENOSCOPY N/A 09/09/2014   Procedure: ESOPHAGOGASTRODUODENOSCOPY (EGD);  Surgeon: Danie Binder, MD;  [facility];  Service: Endoscopy;  Laterality: N/A;   EYE SURGERY Right    JOINT REPLACEMENT     LAMINECTOMY  1997   MAXIMUM ACCESS (MAS)POSTERIOR LUMBAR INTERBODY FUSION (PLIF) 2 LEVEL  11/2013   POSTERIOR LUMBAR INTERBODY FUSION POSTERIOR LATERAL ARTHRODESIS POSTERIOR NONSEGMENTAL INSTRUMENTATION  LUMBAR TWO-THREE ,LUMBAR THREE-FOUR WITH HARDWARE REMOVAL LUMBAR FOUR-FIVE/notes 11/17/2013   POLYPECTOMY  12/15/2020   Procedure: POLYPECTOMY;  Surgeon: Eloise Harman, DO;  [facility];  Service: Endoscopy;;   THYROIDECTOMY N/A 02/05/2017   Procedure: THYROIDECTOMY;  Surgeon: Aviva Signs, MD;  [facility];  Service: General;  Laterality: N/A;   TOTAL HIP ARTHROPLASTY Left 07/21/2018   TOTAL HIP ARTHROPLASTY Left 07/21/2018   Procedure: LEFT TOTAL HIP ARTHROPLASTY ANTERIOR APPROACH;  Surgeon: Mcarthur Rossetti, MD;  Location: [place];  Service: Orthopedics;  Laterality: Left;   TOTAL HIP  ARTHROPLASTY Right 05/04/2019   Procedure: RIGHT TOTAL HIP ARTHROPLASTY ANTERIOR APPROACH;  Surgeon: Mcarthur Rossetti, MD;  Location: [place];  Service: Orthopedics;  Laterality: Right;   TUBAL LIGATION      OB History   No obstetric history on file.      Home Medications    Prior to Admission medications   Medication Sig Start Date End Date Taking? Authorizing Provider  loperamide (IMODIUM) 2 MG capsule Take 1 capsule (2 mg total) by mouth 4 (four) times daily as needed for diarrhea or loose stools. 08/19/21  Yes Hazel Sams, PA-C  ondansetron (ZOFRAN-ODT) 8 MG disintegrating tablet Take 1 tablet (8 mg total) by mouth every 8 (eight) hours as needed for nausea or vomiting. 08/19/21  Yes Hazel Sams, PA-C  ACCU-CHEK AVIVA PLUS test strip  07/02/18   [provider]  ACCU-CHEK SOFTCLIX LANCETS lancets  07/02/18   [provider]  acetaminophen (TYLENOL) 650 MG CR tablet Take 650-1,300 mg by mouth every 8 (eight) hours as needed for pain.    [provider]  aspirin EC 81 MG tablet Take 81 mg by mouth every evening. Swallow whole.    [provider]  atorvastatin (LIPITOR) 20 MG tablet Take 20 mg by mouth every evening. 10/06/20   [provider]  diclofenac (VOLTAREN) 75 MG EC tablet Take 75 mg by mouth 2 (two) times daily.    [provider]  levothyroxine (EUTHYROX) 100 MCG tablet Take 1 tablet (100 mcg total) by mouth daily before breakfast. 05/18/21   Nida, Marella Chimes, MD  losartan (COZAAR) 100 MG tablet Take 100 mg by mouth every evening. 10/06/20   [provider]  metFORMIN (GLUCOPHAGE) 500 MG tablet Take 500 mg by mouth daily with breakfast.    [provider]  omeprazole (PRILOSEC) 20 MG capsule Take 20 mg by mouth in the morning.    [provider]    Family History Family History  Problem Relation Age of Onset   Heart disease Mother    Heart disease Father    Colon cancer Neg Hx      Social History Social History   Tobacco Use   Smoking status: Some Days    Packs/day: 0.25    Years: 2.00    Pack years: 0.50    Types: Cigarettes    Last attempt to quit: 01/31/2010    Years since quitting: 11.5   Smokeless tobacco: Never  Vaping Use   Vaping Use: Never used  Substance Use Topics   Alcohol use: No    Alcohol/week: 0.0 standard drinks   Drug use: No     Allergies   Patient has no known allergies.   Review of Systems Review of Systems  Constitutional:  Negative for appetite change, chills, diaphoresis, fever and unexpected weight change.  HENT:  Negative for congestion, ear pain, sinus pressure, sinus pain, sneezing, sore throat and trouble swallowing.   Respiratory:  Negative for cough, chest tightness and shortness of breath.   Cardiovascular:  Negative for chest pain.  Gastrointestinal:  Positive for diarrhea and nausea. Negative for abdominal distention, abdominal pain, anal bleeding, blood in stool, constipation, rectal pain  and vomiting.  Genitourinary:  Negative for dysuria, flank pain, frequency and urgency.  Musculoskeletal:  Negative for back pain and myalgias.  Neurological:  Negative for dizziness, light-headedness and headaches.    Physical Exam Triage Vital Signs ED Triage Vitals [08/19/21 1226]  Enc Vitals Group     BP (!) 167/87     Pulse Rate 87     Resp (!) 28     Temp 98.6 F (37 C)     Temp Source Oral     SpO2 98 %     Weight      Height      Head Circumference      Peak Flow      Pain Score      Pain Loc      Pain Edu?      Excl. in [place]?    No data found.  Updated Vital Signs BP (!) 167/87 (BP Location: Right Arm)    Pulse 87    Temp 98.6 F (37 C) (Oral)    Resp (!) 28    SpO2 98%   Visual Acuity Right Eye Distance:   Left Eye Distance:   Bilateral Distance:    Right Eye Near:   Left Eye Near:    Bilateral Near:     Physical Exam Vitals reviewed.  Constitutional:      General: She is not in acute  distress.    Appearance: Normal appearance. She is not ill-appearing.  HENT:     Head: Normocephalic and atraumatic.     Mouth/Throat:     Mouth: Mucous membranes are moist.     Comments: Moist mucous membranes Eyes:     Extraocular Movements: Extraocular movements intact.     Pupils: Pupils are equal, round, and reactive to light.  Cardiovascular:     Rate and Rhythm: Normal rate and regular rhythm.     Heart sounds: Normal heart sounds.  Pulmonary:     Effort: Pulmonary effort is normal.     Breath sounds: Normal breath sounds. No wheezing, rhonchi or rales.  Abdominal:     General: Bowel sounds are increased. There is no distension.     Palpations: Abdomen is soft. There is no mass.     Tenderness: There is generalized abdominal tenderness. There is no right CVA tenderness, left CVA tenderness, guarding or rebound.  Skin:    General: Skin is warm.     Capillary Refill: Capillary refill takes 2 to 3 seconds.  Neurological:     General: No focal deficit present.     Mental Status: She is alert and oriented to person, place, and time.  Psychiatric:        Mood and Affect: Mood normal.        Behavior: Behavior normal.     UC Treatments / Results  Labs (all labs ordered are listed, but only abnormal results are displayed) Labs Reviewed - No data to display  EKG   Radiology No results found.  Procedures Procedures (including critical care time)  Medications Ordered in UC Medications - No data to display  Initial Impression / Assessment and Plan / UC Course  I have reviewed the triage vital signs and the nursing notes.  Pertinent labs & imaging results that were available during my care of the patient were reviewed by me and considered in my medical decision making (see chart for details).     This patient is a very pleasant 76 y.o. year old female presenting  with viral gastroenteritis. Afebrile, nontachy. Appears fairly well hydrated. Zofran ODT, imodium sent.  Good hydration, BRAT diet. Strict ED return precautions discussed. Patient verbalizes understanding and agreement. Did not send for covid/influenza testing given no cough, congestion, SOB, etc.   Respirations initially at 28, patient attributes this to breathing strangely in her mask. On repeat this was 20. Denies cough, congestion, SOB, CP, dizziness, etc.   Final Clinical Impressions(s) / UC Diagnoses   Final diagnoses:  Viral gastroenteritis     Discharge Instructions      -Take the Zofran (ondansetron) up to 3 times daily for nausea and vomiting. Dissolve one pill under your tongue or between your teeth and your cheek. -Take the Imodium (loperamide) up to 4 times daily for diarrhea. -Drink plenty of fluids! -Follow-up if you can't keep fluids down despite treatment; you develop new symptoms like dizziness, shortness of breath, fevers, chest pain, etc.      ED Prescriptions     Medication Sig Dispense Auth. Provider   ondansetron (ZOFRAN-ODT) 8 MG disintegrating tablet Take 1 tablet (8 mg total) by mouth every 8 (eight) hours as needed for nausea or vomiting. 20 tablet Hazel Sams, PA-C   loperamide (IMODIUM) 2 MG capsule Take 1 capsule (2 mg total) by mouth 4 (four) times daily as needed for diarrhea or loose stools. 12 capsule Hazel Sams, PA-C      PDMP not reviewed this encounter.   Hazel Sams, PA-C 08/19/21 1257

## 2021-08-19 NOTE — ED Triage Notes (Signed)
Pt reports  diarrhea and weakness x 3 days.

## 2021-11-20 ENCOUNTER — Encounter: Payer: Self-pay | Admitting: "Endocrinology

## 2021-11-20 ENCOUNTER — Ambulatory Visit (INDEPENDENT_AMBULATORY_CARE_PROVIDER_SITE_OTHER): Payer: Medicare HMO | Admitting: "Endocrinology

## 2021-11-20 VITALS — BP 140/82 | HR 72 | Ht 63.0 in | Wt 162.0 lb

## 2021-11-20 DIAGNOSIS — E890 Postprocedural hypothyroidism: Secondary | ICD-10-CM | POA: Diagnosis not present

## 2021-11-20 DIAGNOSIS — C73 Malignant neoplasm of thyroid gland: Secondary | ICD-10-CM | POA: Diagnosis not present

## 2021-11-20 NOTE — Progress Notes (Signed)
? ?                             11/20/2021 ?       ? ?Endocrinology follow-up note ? ? ?Subjective:  ? ? Patient ID: Kristina Huang, female    DOB: 04-Jan-1946, PCP Abran Richard, MD ? ? ?Past Medical History:  ?Diagnosis Date  ? Acid reflux   ? Arthritis   ? Cancer [facility])   ? Thyroid  ? Diabetes mellitus without complication ([place])   ? fasting 100-130  ? Hypercholesterolemia   ? Hypertension   ? Thyroid goiter   ? 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 ?Past Surgical History:  ?Procedure Laterality Date  ? BACK SURGERY    ? BUNIONECTOMY Bilateral   ? CHOLECYSTECTOMY    ? Dr. Irving Shows  ? COLONOSCOPY  2009  ? Dr. Oneida Alar: moderate internal hemorrhoids, frequent sigmoid colon diverticula, polyps X 3, one large adenoma otherwise hyperplastic  ? COLONOSCOPY N/A 09/09/2014  ? Procedure: COLONOSCOPY;  Surgeon: Danie Binder, MD;  [facility];  Service: Endoscopy;  Laterality: N/A;  200 - moved to 1/29 @ 12:45  ? COLONOSCOPY N/A 10/24/2017  ? Procedure: COLONOSCOPY;  Surgeon: Danie Binder, MD;  [facility];  Service: Endoscopy;  Laterality: N/A;  9:30  ? COLONOSCOPY WITH PROPOFOL N/A 12/15/2020  ? Procedure: COLONOSCOPY WITH PROPOFOL;  Surgeon: Eloise Harman, DO;  [facility];  Service: Endoscopy;  Laterality: N/A;  ASA II / AM procedure  ? ESOPHAGOGASTRODUODENOSCOPY N/A 09/09/2014  ? Procedure: ESOPHAGOGASTRODUODENOSCOPY (EGD);  Surgeon: Danie Binder, MD;  [facility];  Service: Endoscopy;  Laterality: N/A;  ? EYE SURGERY Right   ? JOINT REPLACEMENT    ? LAMINECTOMY  1997  ? MAXIMUM ACCESS (MAS)POSTERIOR LUMBAR INTERBODY FUSION (PLIF) 2 LEVEL  11/2013  ? POSTERIOR LUMBAR INTERBODY FUSION POSTERIOR LATERAL ARTHRODESIS POSTERIOR NONSEGMENTAL INSTRUMENTATION  LUMBAR TWO-THREE ,LUMBAR THREE-FOUR WITH HARDWARE REMOVAL LUMBAR FOUR-FIVE/notes 11/17/2013  ? POLYPECTOMY  12/15/2020  ? Procedure: POLYPECTOMY;  Surgeon: Eloise Harman, DO;  [facility];  Service:  Endoscopy;;  ? THYROIDECTOMY N/A 02/05/2017  ? Procedure: THYROIDECTOMY;  Surgeon: Aviva Signs, MD;  [facility];  Service: General;  Laterality: N/A;  ? TOTAL HIP ARTHROPLASTY Left 07/21/2018  ? TOTAL HIP ARTHROPLASTY Left 07/21/2018  ? Procedure: LEFT TOTAL HIP ARTHROPLASTY ANTERIOR APPROACH;  Surgeon: Mcarthur Rossetti, MD;  Location: [place];  Service: Orthopedics;  Laterality: Left;  ? TOTAL HIP ARTHROPLASTY Right 05/04/2019  ? Procedure: RIGHT TOTAL HIP ARTHROPLASTY ANTERIOR APPROACH;  Surgeon: Mcarthur Rossetti, MD;  Location: [place];  Service: Orthopedics;  Laterality: Right;  ? TUBAL LIGATION    ? ?Social History  ? ?Socioeconomic History  ? Marital status: Divorced  ?  Spouse name: Not on file  ? Number of children: Not on file  ? Years of education: Not on file  ? Highest education level: Not on file  ?Occupational History  ? Not on file  ?Tobacco Use  ? Smoking status: Some Days  ?  Packs/day: 0.25  ?  Years: 2.00  ?  Pack years: 0.50  ?  Types: Cigarettes  ?  Last attempt to quit: 01/31/2010  ?  Years since quitting: 11.8  ? Smokeless tobacco: Never  ?Vaping Use  ? Vaping Use: Never used  ?Substance and Sexual Activity  ? Alcohol use: No  ?  Alcohol/week: 0.0 standard drinks  ? Drug use: No  ? Sexual activity: Not Currently  ?  Birth control/protection: Surgical  ?Other Topics Concern  ? Not on file  ?Social History Narrative  ? Not on file  ? ?Social Determinants of Health  ? ?Financial Resource Strain: Not on file  ?Food Insecurity: Not on file  ?Transportation Needs: Not on file  ?Physical Activity: Not on file  ?Stress: Not on file  ?Social Connections: Not on file  ? ?Outpatient Encounter Medications as of 11/20/2021  ?Medication Sig  ? ACCU-CHEK AVIVA PLUS test strip   ? ACCU-CHEK SOFTCLIX LANCETS lancets   ? acetaminophen (TYLENOL) 650 MG CR tablet Take 650-1,300 mg by mouth every 8 (eight) hours as needed for pain.  ? aspirin EC 81 MG tablet Take 81 mg by mouth every evening. Swallow  whole.  ? atorvastatin (LIPITOR) 20 MG tablet Take 20 mg by mouth every evening.  ? diclofenac (VOLTAREN) 75 MG EC tablet Take 75 mg by mouth 2 (two) times daily.  ? levothyroxine (EUTHYROX) 100 MCG tablet Take 1 tablet (100 mcg total) by mouth daily before breakfast.  ? loperamide (IMODIUM) 2 MG capsule Take 1 capsule (2 mg total) by mouth 4 (four) times daily as needed for diarrhea or loose stools.  ? losartan (COZAAR) 100 MG tablet Take 100 mg by mouth every evening.  ? metFORMIN (GLUCOPHAGE) 500 MG tablet Take 500 mg by mouth daily with breakfast.  ? omeprazole (PRILOSEC) 20 MG capsule Take 20 mg by mouth in the morning.  ? ondansetron (ZOFRAN-ODT) 8 MG disintegrating tablet Take 1 tablet (8 mg total) by mouth every 8 (eight) hours as needed for nausea or vomiting.  ? ?No facility-administered encounter medications on file as of 11/20/2021.  ? ?ALLERGIES: ?No Known Allergies ? ?VACCINATION STATUS: ?Immunization History  ?Administered Date(s) Administered  ? Fluad Quad(high Dose 65+) 05/06/2019  ? Pneumococcal Polysaccharide-23 05/06/2019  ? ? ?HPI ? ?76 year old female patient with medical history as above.    She is returning for follow-up for history of thyroid malignancy and postsurgical hypothyroidism.  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 The left lobe was said to be consistent with hyperplastic nodule. ?- She underwent I-131 thyroid remnant ablation on 04/15/2017 followed by post therapy whole body scan. ?-  On the scan, she was found to have foci of abnormal radioiodine accumulation in the cervical region, suggestive of thyroid remnant or metastatic cervical lymph node. ? ?-Her subsequent thyroid/neck ultrasound  confirms decrease in size of this lymph node to 0.3 cm and no new concerns. ? ?-She  denies dysphagia, shortness of breath, nor voice change. ? ?Her subsequent thyroid/neck ultrasound is negative for any residual thyroid tissue nor cervical adenopathy, in September 2020. ? ?Prior to her October 2021 visit , in December 06, 2019 she underwent Thyrogen stimulated whole-body scan which did not show any evidence of tumor recurrence or distant metastasis. ? ?Her most recent thyroid/neck ultrasound on April 19, 2021 did not show any evidence of tumor recurrence. ? ?-She is currently on levothyroxine 100 mcg p.o. daily before breakfast.  She is compliant and reports consistency of taking her medications.  Her previsit thyroid function tests are consistent with appropriate replacement.   ? ?She has no new complaints today.   ?- She denies any family history  of thyroid cancer, however one of her sisters have hyperthyroidism status post treatment with what appears to be radioactive iodine therapy. ? ? ? ?Objective:  ?  ?BP 140/82   Pulse 72   Ht '5\' 3"'$  (1.6 m)   Wt 162 lb (73.5 kg)   BMI 28.70 kg/m?   ?Wt Readings from Last 3 Encounters:  ?11/20/21 162 lb (73.5 kg)  ?05/18/21 158 lb 12.8 oz (72 kg)  ?12/15/20 155 lb (70.3 kg)  ?  ? ? ? ?CMP ( most recent) ?CMP  ?   ?Component Value Date/Time  ? NA 137 05/07/2019 0355  ? K 3.3 (L) 05/07/2019 0355  ? CL 104 05/07/2019 0355  ? CO2 26 05/07/2019 0355  ? GLUCOSE 128 (H) 05/07/2019 0355  ? BUN 8 05/07/2019 0355  ? CREATININE 0.59 05/07/2019 0355  ? CREATININE 0.80 03/26/2018 1041  ? CALCIUM 8.3 (L) 05/07/2019 0355  ? PROT 7.2 03/26/2018 1041  ? ALBUMIN 3.2 (L) 02/06/2017 0444  ? AST 16 03/26/2018 1041  ? ALT 15 03/26/2018 1041  ? ALKPHOS 68 02/06/2017 0444  ? BILITOT 0.4 03/26/2018 1041  ? GFRNONAA >60 05/07/2019 0355  ? GFRNONAA 74 03/26/2018 1041  ? GFRAA >60 05/07/2019 0355  ? GFRAA 85 03/26/2018 1041  ? ? ? ? ?  Fine-needle aspiration cytology of the thyroid on 10/23/2016 showed: ?THYROID, FINE NEEDLE ASPIRATION, LEFT (SPECIMEN 1 OF 1, COLLECTED  10/23/16): ?CONSISTENT WITH BENIGN FOLLICULAR NODULE (B[place]ES[place] CATEGORY II). ? ?02/05/2017: ?She underwent total thyroidectomy: ?02/05/2017: INAL DIAGNOSIS ?Diagnosis ?1. Thyroid, lobectomy, left ?- NODULAR HYPERPLA

## 2021-11-22 LAB — T4, FREE: Free T4: 1.73 ng/dL (ref 0.82–1.77)

## 2021-11-22 LAB — THYROGLOBULIN LEVEL: Thyroglobulin (TG-RIA): <2 ng/mL

## 2021-11-22 LAB — TSH: TSH: 1.7 u[IU]/mL (ref 0.450–4.500)

## 2022-01-08 ENCOUNTER — Other Ambulatory Visit (HOSPITAL_COMMUNITY): Payer: Self-pay | Admitting: Internal Medicine

## 2022-01-08 DIAGNOSIS — Z1231 Encounter for screening mammogram for malignant neoplasm of breast: Secondary | ICD-10-CM

## 2022-01-14 ENCOUNTER — Ambulatory Visit (HOSPITAL_COMMUNITY)
Admission: RE | Admit: 2022-01-14 | Discharge: 2022-01-14 | Disposition: A | Discharge status: Home / Self Care | Payer: Medicare HMO | Source: Ambulatory Visit | Attending: Internal Medicine | Admitting: Internal Medicine

## 2022-01-14 DIAGNOSIS — Z1231 Encounter for screening mammogram for malignant neoplasm of breast: Secondary | ICD-10-CM | POA: Insufficient documentation

## 2022-05-10 ENCOUNTER — Other Ambulatory Visit: Payer: Self-pay | Admitting: "Endocrinology

## 2022-05-13 ENCOUNTER — Telehealth: Payer: Self-pay | Admitting: "Endocrinology

## 2022-05-13 NOTE — Telephone Encounter (Signed)
New message    1. Which medications need to be refilled? (please list name of each medication and dose if known) levothyroxine (EUTHYROX) 100 MCG tablet  2. Which pharmacy/location (including street and city if local pharmacy) is medication to be sent to? [facility] in [place]   3. Do they need a 30 day or 90 day supply? 90 day supply

## 2022-05-14 NOTE — Telephone Encounter (Signed)
Rx refill sent to pharmacy by T.Todd,LPN.

## 2022-05-22 ENCOUNTER — Ambulatory Visit: Payer: Medicare HMO | Admitting: "Endocrinology

## 2022-06-03 LAB — THYROGLOBULIN LEVEL: Thyroglobulin (TG-RIA): <2 ng/mL

## 2022-06-03 LAB — TSH: TSH: 1.12 u[IU]/mL (ref 0.450–4.500)

## 2022-06-03 LAB — T4, FREE: Free T4: 1.73 ng/dL (ref 0.82–1.77)

## 2022-07-25 ENCOUNTER — Other Ambulatory Visit: Payer: Self-pay | Admitting: "Endocrinology

## 2022-08-13 ENCOUNTER — Other Ambulatory Visit: Payer: Self-pay

## 2022-08-13 DIAGNOSIS — E890 Postprocedural hypothyroidism: Secondary | ICD-10-CM

## 2022-08-21 LAB — THYROGLOBULIN LEVEL: Thyroglobulin (TG-RIA): <2 ng/mL

## 2022-08-21 LAB — T4, FREE: Free T4: 1.67 ng/dL (ref 0.82–1.77)

## 2022-08-21 LAB — TSH: TSH: 2.49 u[IU]/mL (ref 0.450–4.500)

## 2022-09-02 ENCOUNTER — Ambulatory Visit: Payer: Medicare HMO | Admitting: "Endocrinology

## 2022-09-02 ENCOUNTER — Encounter: Payer: Self-pay | Admitting: "Endocrinology

## 2022-09-02 VITALS — BP 150/84 | HR 76 | Ht 63.0 in | Wt 153.4 lb

## 2022-09-02 DIAGNOSIS — E890 Postprocedural hypothyroidism: Secondary | ICD-10-CM | POA: Diagnosis not present

## 2022-09-02 DIAGNOSIS — C73 Malignant neoplasm of thyroid gland: Secondary | ICD-10-CM | POA: Diagnosis not present

## 2022-09-02 MED ORDER — LEVOTHYROXINE SODIUM 100 MCG PO TABS: 100.0000 ug | ORAL_TABLET | Freq: Every day | ORAL | 1 refills | Status: DC

## 2022-09-02 NOTE — Progress Notes (Signed)
09/02/2022         Endocrinology follow-up note   Subjective:    Patient ID: Kristina Huang, female    DOB: Jul 19, 1946, PCP Abran Richard, MD   Past Medical History:  Diagnosis Date   Acid reflux    Arthritis    Cancer [facility])    Thyroid   Diabetes mellitus without complication ([place])    fasting 100-130   Hypercholesterolemia    Hypertension    Thyroid goiter    right tracheal deviation by CXR 10/2013   Past Surgical History:  Procedure Laterality Date   BACK SURGERY     BUNIONECTOMY Bilateral    CHOLECYSTECTOMY     Dr. Irving Shows   COLONOSCOPY  2009   Dr. Oneida Alar: moderate internal hemorrhoids, frequent sigmoid colon diverticula, polyps X 3, one large adenoma otherwise hyperplastic   COLONOSCOPY N/A 09/09/2014   Procedure: COLONOSCOPY;  Surgeon: Danie Binder, MD;  [facility];  Service: Endoscopy;  Laterality: N/A;  200 - moved to 1/29 @ 12:45   COLONOSCOPY N/A 10/24/2017   Procedure: COLONOSCOPY;  Surgeon: Danie Binder, MD;  [facility];  Service: Endoscopy;  Laterality: N/A;  9:30   COLONOSCOPY WITH PROPOFOL N/A 12/15/2020   Procedure: COLONOSCOPY WITH PROPOFOL;  Surgeon: Eloise Harman, DO;  [facility];  Service: Endoscopy;  Laterality: N/A;  ASA II / AM procedure   ESOPHAGOGASTRODUODENOSCOPY N/A 09/09/2014   Procedure: ESOPHAGOGASTRODUODENOSCOPY (EGD);  Surgeon: Danie Binder, MD;  [facility];  Service: Endoscopy;  Laterality: N/A;   EYE SURGERY Right    JOINT REPLACEMENT     LAMINECTOMY  1997   MAXIMUM ACCESS (MAS)POSTERIOR LUMBAR INTERBODY FUSION (PLIF) 2 LEVEL  11/2013   POSTERIOR LUMBAR INTERBODY FUSION POSTERIOR LATERAL ARTHRODESIS POSTERIOR NONSEGMENTAL INSTRUMENTATION  LUMBAR TWO-THREE ,LUMBAR THREE-FOUR WITH HARDWARE REMOVAL LUMBAR FOUR-FIVE/notes 11/17/2013   POLYPECTOMY  12/15/2020   Procedure: POLYPECTOMY;  Surgeon: Eloise Harman, DO;  [facility];  Service:  Endoscopy;;   THYROIDECTOMY N/A 02/05/2017   Procedure: THYROIDECTOMY;  Surgeon: Aviva Signs, MD;  [facility];  Service: General;  Laterality: N/A;   TOTAL HIP ARTHROPLASTY Left 07/21/2018   TOTAL HIP ARTHROPLASTY Left 07/21/2018   Procedure: LEFT TOTAL HIP ARTHROPLASTY ANTERIOR APPROACH;  Surgeon: Mcarthur Rossetti, MD;  Location: [place];  Service: Orthopedics;  Laterality: Left;   TOTAL HIP ARTHROPLASTY Right 05/04/2019   Procedure: RIGHT TOTAL HIP ARTHROPLASTY ANTERIOR APPROACH;  Surgeon: Mcarthur Rossetti, MD;  Location: [place];  Service: Orthopedics;  Laterality: Right;   TUBAL LIGATION     Social History   Socioeconomic History   Marital status: Divorced    Spouse name: Not on file   Number of children: Not on file   Years of education: Not on file   Highest education level: Not on file  Occupational History   Not on file  Tobacco Use   Smoking status: Some Days    Packs/day: 0.25    Years: 2.00    Total pack years: 0.50    Types: Cigarettes    Last attempt to quit: 01/31/2010    Years since quitting: 12.5   Smokeless tobacco: Never  Vaping Use   Vaping Use: Never used  Substance and Sexual Activity   Alcohol use:  No    Alcohol/week: 0.0 standard drinks of alcohol   Drug use: No   Sexual activity: Not Currently    Birth control/protection: Surgical  Other Topics Concern   Not on file  Social History Narrative   Not on file   Social Determinants of Health   Financial Resource Strain: Not on file  Food Insecurity: Not on file  Transportation Needs: Not on file  Physical Activity: Not on file  Stress: Not on file  Social Connections: Not on file   Outpatient Encounter Medications as of 09/02/2022  Medication Sig   ACCU-CHEK AVIVA PLUS test strip    ACCU-CHEK SOFTCLIX LANCETS lancets    acetaminophen (TYLENOL) 650 MG CR tablet Take 650-1,300 mg by mouth every 8 (eight) hours as needed for pain.   aspirin EC 81 MG tablet Take 81 mg by mouth every  evening. Swallow whole.   atorvastatin (LIPITOR) 20 MG tablet Take 20 mg by mouth every evening.   diclofenac (VOLTAREN) 75 MG EC tablet Take 75 mg by mouth 2 (two) times daily.   levothyroxine (SYNTHROID) 100 MCG tablet Take 1 tablet (100 mcg total) by mouth daily before breakfast.   losartan (COZAAR) 100 MG tablet Take 100 mg by mouth every evening.   metFORMIN (GLUCOPHAGE) 500 MG tablet Take 500 mg by mouth daily with breakfast.   omeprazole (PRILOSEC) 20 MG capsule Take 20 mg by mouth in the morning.   [DISCONTINUED] levothyroxine (SYNTHROID) 100 MCG tablet TAKE 1 TABLET BY MOUTH ONCE DAILY BEFORE BREAKFAST   [DISCONTINUED] loperamide (IMODIUM) 2 MG capsule Take 1 capsule (2 mg total) by mouth 4 (four) times daily as needed for diarrhea or loose stools.   [DISCONTINUED] ondansetron (ZOFRAN-ODT) 8 MG disintegrating tablet Take 1 tablet (8 mg total) by mouth every 8 (eight) hours as needed for nausea or vomiting.   No facility-administered encounter medications on file as of 09/02/2022.   ALLERGIES: No Known Allergies  VACCINATION STATUS: Immunization History  Administered Date(s) Administered   Fluad Quad(high Dose 65+) 05/06/2019   Pneumococcal Polysaccharide-23 05/06/2019    HPI  77 year old female patient with medical history as above.    She is returning for follow-up for history of thyroid malignancy and postsurgical hypothyroidism.  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 The left lobe was said to be consistent with hyperplastic nodule. - She underwent I-131 thyroid remnant ablation on 04/15/2017 followed by post therapy whole body scan. -  On the scan, she was found to have foci of abnormal radioiodine accumulation in the cervical region, suggestive of thyroid remnant or  metastatic cervical lymph node.  -Her subsequent thyroid/neck ultrasound  confirms decrease in size of this lymph node to 0.3 cm and no new concerns.  -She denies dysphagia, shortness of breath, nor voice change.  Her subsequent thyroid/neck ultrasound is negative for any residual thyroid tissue nor cervical adenopathy, in September 2020.  Prior to her October 2021 visit , in December 06, 2019 she underwent Thyrogen stimulated whole-body scan which did not show any evidence of tumor recurrence or distant metastasis.  Her most recent thyroid/neck ultrasound on April 19, 2021 did not show any evidence of tumor recurrence.  -She is currently on levothyroxine 100 mcg p.o. daily before breakfast.  She is compliant and reports consistency of taking her medications.  Her previsit  thyroid function tests are consistent with appropriate replacement.    She has no new complaints today.   - She denies any family history of thyroid cancer, however one of her sisters have hyperthyroidism status post treatment with what appears to be radioactive iodine therapy.    Objective:    BP (!) 150/84   Pulse 76   Ht '5\' 3"'$  (1.6 m)   Wt 153 lb 6.4 oz (69.6 kg)   BMI 27.17 kg/m   Wt Readings from Last 3 Encounters:  09/02/22 153 lb 6.4 oz (69.6 kg)  11/20/21 162 lb (73.5 kg)  05/18/21 158 lb 12.8 oz (72 kg)       CMP ( most recent) CMP     Component Value Date/Time   NA 137 05/07/2019 0355   K 3.3 (L) 05/07/2019 0355   CL 104 05/07/2019 0355   CO2 26 05/07/2019 0355   GLUCOSE 128 (H) 05/07/2019 0355   BUN 8 05/07/2019 0355   CREATININE 0.59 05/07/2019 0355   CREATININE 0.80 03/26/2018 1041   CALCIUM 8.3 (L) 05/07/2019 0355   PROT 7.2 03/26/2018 1041   ALBUMIN 3.2 (L) 02/06/2017 0444   AST 16 03/26/2018 1041   ALT 15 03/26/2018 1041   ALKPHOS 68 02/06/2017 0444   BILITOT 0.4 03/26/2018 1041   GFRNONAA >60 05/07/2019 0355   GFRNONAA 74 03/26/2018 1041   GFRAA >60 05/07/2019 0355   GFRAA  85 03/26/2018 1041        Fine-needle aspiration cytology of the thyroid on 10/23/2016 showed: THYROID, FINE NEEDLE ASPIRATION, LEFT (SPECIMEN 1 OF 1, COLLECTED 10/23/16): CONSISTENT WITH BENIGN FOLLICULAR NODULE (BETHESDA CATEGORY II).  02/05/2017: She underwent total thyroidectomy: 02/05/2017: INAL DIAGNOSIS Diagnosis 1. Thyroid, lobectomy, left - NODULAR HYPERPLASIA (MULTINODULAR GOITER). 2. Thyroid, lobectomy, right - PAPILLARY THYROID CARCINOMA, TWO TUMORS, 0.5 AND 0.2 CM. - PAPILLARY CARCINOMA FOCALLY EXTENDS INTO THE PERITHYROIDAL CONNECTIVE TISSUE. - HURTHLE CELL ADENOMATOUS NODULE. - NODULAR HYPERPLASIA.  TNM code: pT1a , pNX.   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   No distant sites of iodine-avid metastatic thyroid cancer Identified.  Surveillance thyroid/neck ultrasound from 05/19/2017 showed:  Surgically absent thyroid lobes. A single morphologically unremarkable 0.8 cm left cervical lymph node is noted. No pathologic adenopathy.   IMPRESSION: No evidence of residual/ recurrent tissue post thyroidectomy   Thyroid/neck ultrasound on February 23, 2018 IMPRESSION: 1. Post total thyroidectomy without evidence of residual nodular tissue within the thyroidectomy resection bed. 2. Solitary left-sided cervical lymph node is not enlarged by size criteria measuring 0.3 cm in diameter. No definitive bulky regional cervical lymphadenopathy.     Thyroid/neck ultrasound on April 28, 2019 No discrete nodules are seen within the thyroidectomy bed. No regional adenopathy identified.    IMPRESSION: 1. No residual/recurrent tissue post thyroidectomy. No adenopathy identified.    Thyrogen stimulated whole-body scan on December 06, 2019  Resolution of previously identified abnormal uptake in the right cervical region.  No scintigraphic evidence  of iodine - avid metastatic thyroid cancer.  April 19, 2021 thyroid/neck ultrasound FINDINGS: No residual thyroid tissue.  No enlarged neck lymph nodes.   IMPRESSION: No evidence of recurrent or residual thyroid malignancy.  Recent Results (from the past 2160 hour(s))  Thyroglobulin Level     Status: None   Collection Time: 08/14/22  8:40 AM  Result Value Ref Range   Thyroglobulin (TG-RIA) <2.0 ng/mL  Comment: This test was developed and its performance characteristics determined by [facility]. It has not been cleared or approved by the Food and Drug Administration. Reference Range: Pubertal Children and Adults: <40 According to the [facility] of Clinical Biochemistry, the reference interval for Thyroglobulin (TG) should be related to euthyroid patients and not for patients who underwent thyroidectomy.  TG reference intervals for these patients depend on the residual mass of the thyroid tissue left after surgery.  Establishing a post-operative baseline is recommended.  The assay quantitation limit is 2.0 ng/mL.   T4, Free     Status: None   Collection Time: 08/14/22  8:40 AM  Result Value Ref Range   Free T4 1.67 0.82 - 1.77 ng/dL  TSH     Status: None   Collection Time: 08/14/22  8:40 AM  Result Value Ref Range   TSH 2.490 0.450 - 4.500 uIU/mL     Assessment & Plan:   1. 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 Her surgical sample unexpectedly revealed occult malignancy on the right lobe of the thyroid, 0.5 cm  with focal extrathyroidal extension and 0.2 cm foci.  TNM code: pT1a , pNX. - She is status post total thyroidectomy and I-131 thyroid remnant ablation on 04/15/2017.  - Thyroglobulin level and thyroglobulin antibodies are  low  on the day of or body scan. - Whole-body scan IMPRESSION: Uptake in the cervical region suspect  representing a combination of thyroid remnant and a metastatic cervical lymph node.   No distant sites of iodine-avid metastatic thyroid cancer Identified.  - Surveillance  thyroid/neck ultrasound - is unremarkable except for nonspecific 0.8 cm lymph node on the left cervical region.   Subsequent thyroid/neck ultrasound on February 23, 2018 showed decreasing size of his nonspecific lymph node to 0.3 cm. -Her most recent thyroid/neck ultrasound is negative for residual thyroid tissue nor thyroid lymphadenopathy.    -Her previsit Thyrogen stimulated whole-body scan-is negative for iodine avid distant metastasis, resolution of previously documented uptake in the cervical region.   -Her last thyroid/neck ultrasound from September 2022 did not show any evidence of tumor recurrence.   She will be considered for surveillance thyroid/neck ultrasound before her next visit in 6 months.   2. Postsurgical hypothyroidism  Her previsit thyroid function tests are consistent with appropriate replacement.  She is advised to continue levothyroxine 100 mcg p.o. daily before breakfast.     - We discussed about the correct intake of her thyroid hormone, on empty stomach at fasting, with water, separated by at least 30 minutes from breakfast and other medications,  and separated by more than 4 hours from calcium, iron, multivitamins, acid reflux medications (PPIs). -Patient is made aware of the fact that thyroid hormone replacement is needed for life, dose to be adjusted by periodic monitoring of thyroid function tests.   - I advised patient to maintain close follow up with Abran Richard, MD for primary care needs.    I spent 23 minutes in the care of the patient today including review of labs from Thyroid Function, CMP, and other relevant labs ; imaging/biopsy records (current and previous including abstractions from other facilities); face-to-face time discussing  her lab results and symptoms, medications  doses, her options of short and long term treatment based on the latest standards of care / guidelines;   and documenting the encounter.  Kristina Huang  participated in the discussions, expressed understanding, and voiced agreement with the above plans.  All questions were answered to her satisfaction. she is encouraged to contact clinic should she have any questions or concerns prior to her return visit.   Follow up plan: Return in about 6 months (around 03/03/2023) for F/U with Pre-visit Labs, Thyroid / Neck Ultrasound.  Glade Lloyd, MD Phone: [phone]  Fax: [phone]  This note was partially dictated with voice recognition software. Similar sounding words can be transcribed inadequately or may not  be corrected upon review.  09/02/2022, 5:35 PM

## 2022-12-30 ENCOUNTER — Ambulatory Visit (HOSPITAL_COMMUNITY)
Admission: RE | Admit: 2022-12-30 | Discharge: 2022-12-30 | Disposition: A | Discharge status: Home / Self Care | Payer: Medicare HMO | Source: Ambulatory Visit | Attending: "Endocrinology | Admitting: "Endocrinology

## 2022-12-30 DIAGNOSIS — E890 Postprocedural hypothyroidism: Secondary | ICD-10-CM | POA: Diagnosis present

## 2023-01-08 LAB — THYROGLOBULIN LEVEL: Thyroglobulin (TG-RIA): <2 ng/mL

## 2023-01-08 LAB — TSH: TSH: 1.3 u[IU]/mL (ref 0.450–4.500)

## 2023-01-08 LAB — THYROGLOBULIN ANTIBODY: Thyroglobulin Antibody: <1 IU/mL (ref 0.0–0.9)

## 2023-01-08 LAB — T4, FREE: Free T4: 1.72 ng/dL (ref 0.82–1.77)

## 2023-02-03 ENCOUNTER — Ambulatory Visit: Payer: Medicare HMO | Admitting: Orthopedic Surgery

## 2023-02-03 ENCOUNTER — Encounter: Payer: Self-pay | Admitting: Orthopedic Surgery

## 2023-02-03 VITALS — BP 168/100 | HR 86 | Ht 62.0 in | Wt 152.0 lb

## 2023-02-03 DIAGNOSIS — G5601 Carpal tunnel syndrome, right upper limb: Secondary | ICD-10-CM | POA: Diagnosis not present

## 2023-02-03 MED ORDER — VITAMIN B-6 100 MG PO TABS: 100.0000 mg | ORAL_TABLET | Freq: Two times a day (BID) | ORAL | 0 refills | Status: AC

## 2023-02-03 MED ORDER — GABAPENTIN 100 MG PO CAPS: 100.0000 mg | ORAL_CAPSULE | Freq: Three times a day (TID) | ORAL | 2 refills | Status: AC

## 2023-02-03 NOTE — Progress Notes (Signed)
Office Visit Note   Patient: Kristina Huang           Date of Birth: [birth year]           MRN: [id number] Visit Date: 02/03/2023 Requested by: Alvina Filbert, MD [address] [place],  [place] [address] PCP: Alvina Filbert, MD  Subjective: Chief Complaint  Patient presents with   Hand Injury    Right hand pain x 1 year with tingling and numbness in 1st 2nd and 3rd metacarpal and loss of strength.  Unable to open jars pick up things or button clothes patient says she does not want surgery     HPI: 77 year old female with 1 year history of carpal tunnel like symptoms as described.  Patient however does not want surgery              ROS: Review of systems wheezing tingling  Assessment & Plan:  Images personally read and my interpretation : No images  Visit Diagnoses:  1. Carpal tunnel syndrome of right wrist     Plan: Nonoperative treatment for carpal tunnel syndrome to include    PLAN:   Wrist splint  yes Gabapentin yes Vit B 6 yes NCS no     Follow-Up Instructions: Return in about 1 month (around 03/05/2023) for FOLLOW UP, RIGHT carpal tunnel .   Orders:  Meds ordered this encounter  Medications   gabapentin (NEURONTIN) 100 MG capsule    Sig: Take 1 capsule (100 mg total) by mouth 3 (three) times daily.    Dispense:  90 capsule    Refill:  2   pyridOXINE (VITAMIN B6) 100 MG tablet    Sig: Take 1 tablet (100 mg total) by mouth 2 (two) times daily.    Dispense:  90 tablet    Refill:  0      Objective: Vital Signs: BP (!) 168/100   Pulse 86   Ht 5\' 2"  (1.575 m)   Wt 152 lb (68.9 kg)   BMI 27.80 kg/m    HPI   No Known Allergies'  Current Outpatient Medications  Medication Instructions   ACCU-CHEK AVIVA PLUS test strip No dose, route, or frequency recorded.   ACCU-CHEK SOFTCLIX LANCETS lancets No dose, route, or frequency recorded.   acetaminophen (TYLENOL) 650-1,300 mg, Oral, Every 8 hours PRN   aspirin EC 81 mg, Oral, Every evening,  Swallow whole.   atorvastatin (LIPITOR) 20 mg, Oral, Every evening   diclofenac (VOLTAREN) 75 mg, Oral, 2 times daily   gabapentin (NEURONTIN) 100 mg, Oral, 3 times daily   levothyroxine (SYNTHROID) 100 mcg, Oral, Daily before breakfast   losartan (COZAAR) 100 mg, Oral, Every evening   metFORMIN (GLUCOPHAGE) 500 mg, Daily with breakfast   omeprazole (PRILOSEC) 20 mg, Oral, Every morning   pyridOXINE (VITAMIN B6) 100 mg, Oral, 2 times daily    Review of Systems Review of Systems   Past Medical History:  Diagnosis Date   Acid reflux    Arthritis    Cancer ([facility])    Thyroid   Diabetes mellitus without complication ([facility])    fasting 100-130   Hypercholesterolemia    Hypertension    Thyroid goiter    right tracheal deviation by CXR 10/2013    Past Surgical History:  Procedure Laterality Date   BACK SURGERY     BUNIONECTOMY Bilateral    CHOLECYSTECTOMY     Dr. Elpidio Anis   COLONOSCOPY  2009   Dr. Darrick Penna: moderate internal hemorrhoids, frequent sigmoid colon  diverticula, polyps X 3, one large adenoma otherwise hyperplastic   COLONOSCOPY N/A 09/09/2014   Procedure: COLONOSCOPY;  Surgeon: West Bali, MD;  [facility];  Service: Endoscopy;  Laterality: N/A;  200 - moved to 1/29 @ 12:45   COLONOSCOPY N/A 10/24/2017   Procedure: COLONOSCOPY;  Surgeon: West Bali, MD;  [facility];  Service: Endoscopy;  Laterality: N/A;  9:30   COLONOSCOPY WITH PROPOFOL N/A 12/15/2020   Procedure: COLONOSCOPY WITH PROPOFOL;  Surgeon: Lanelle Bal, DO;  [facility];  Service: Endoscopy;  Laterality: N/A;  ASA II / AM procedure   ESOPHAGOGASTRODUODENOSCOPY N/A 09/09/2014   Procedure: ESOPHAGOGASTRODUODENOSCOPY (EGD);  Surgeon: West Bali, MD;  [facility];  Service: Endoscopy;  Laterality: N/A;   EYE SURGERY Right    JOINT REPLACEMENT     LAMINECTOMY  1997   MAXIMUM ACCESS (MAS)POSTERIOR LUMBAR INTERBODY FUSION (PLIF) 2 LEVEL  11/2013    POSTERIOR LUMBAR INTERBODY FUSION POSTERIOR LATERAL ARTHRODESIS POSTERIOR NONSEGMENTAL INSTRUMENTATION  LUMBAR TWO-THREE ,LUMBAR THREE-FOUR WITH HARDWARE REMOVAL LUMBAR FOUR-FIVE/notes 11/17/2013   POLYPECTOMY  12/15/2020   Procedure: POLYPECTOMY;  Surgeon: Lanelle Bal, DO;  [facility];  Service: Endoscopy;;   THYROIDECTOMY N/A 02/05/2017   Procedure: THYROIDECTOMY;  Surgeon: Franky Macho, MD;  [facility];  Service: General;  Laterality: N/A;   TOTAL HIP ARTHROPLASTY Left 07/21/2018   TOTAL HIP ARTHROPLASTY Left 07/21/2018   Procedure: LEFT TOTAL HIP ARTHROPLASTY ANTERIOR APPROACH;  Surgeon: Kathryne Hitch, MD;  [facility];  Service: Orthopedics;  Laterality: Left;   TOTAL HIP ARTHROPLASTY Right 05/04/2019   Procedure: RIGHT TOTAL HIP ARTHROPLASTY ANTERIOR APPROACH;  Surgeon: Kathryne Hitch, MD;  Location[facility];  Service: Orthopedics;  Laterality: Right;   TUBAL LIGATION      Family History  Problem Relation Age of Onset   Heart disease Mother    Heart disease Father    Colon cancer Neg Hx      Social History   Tobacco Use   Smoking status: Some Days    Packs/day: 0.25    Years: 2.00    Additional pack years: 0.00    Total pack years: 0.50    Types: Cigarettes    Last attempt to quit: 01/31/2010    Years since quitting: 13.0   Smokeless tobacco: Never  Vaping Use   Vaping Use: Never used  Substance Use Topics   Alcohol use: No    Alcohol/week: 0.0 standard drinks of alcohol   Drug use: No    No Known Allergies  Current Outpatient Medications  Medication Sig Dispense Refill   ACCU-CHEK AVIVA PLUS test strip      ACCU-CHEK SOFTCLIX LANCETS lancets      acetaminophen (TYLENOL) 650 MG CR tablet Take 650-1,300 mg by mouth every 8 (eight) hours as needed for pain.     aspirin EC 81 MG tablet Take 81 mg by mouth every evening. Swallow whole.     atorvastatin (LIPITOR) 20 MG tablet Take 20 mg by mouth every evening.     diclofenac  (VOLTAREN) 75 MG EC tablet Take 75 mg by mouth 2 (two) times daily.     gabapentin (NEURONTIN) 100 MG capsule Take 1 capsule (100 mg total) by mouth 3 (three) times daily. 90 capsule 2   levothyroxine (SYNTHROID) 100 MCG tablet Take 1 tablet (100 mcg total) by mouth daily before breakfast. 90 tablet 1   losartan (COZAAR) 100 MG tablet  Take 100 mg by mouth every evening.     metFORMIN (GLUCOPHAGE) 500 MG tablet Take 500 mg by mouth daily with breakfast.     omeprazole (PRILOSEC) 20 MG capsule Take 20 mg by mouth in the morning.     pyridOXINE (VITAMIN B6) 100 MG tablet Take 1 tablet (100 mg total) by mouth 2 (two) times daily. 90 tablet 0   No current facility-administered medications for this visit.     Physical Exam BP (!) 168/100   Pulse 86   Ht 5\' 2"  (1.575 m)   Wt 152 lb (68.9 kg)   BMI 27.80 kg/m   The patient is well developed well nourished and well groomed.  Orientation to person place and time is normal  Mood is pleasant.  Ambulatory status normal gait and stance   Right upper extremity examination reveals the following:  SWELLING no TENDERNESS OVER THE CARPAL TUNNEL yes  Range of motion of the wrist normal  Motor exam grip strength is weak compared to the left  Wrist joint is stable  Provocative tests for carpal tunnel Phalen's test negative  Carpal tunnel compression test negative Tinel's test negative  Pulses are normal in the radial and ulnar artery with a normal Allen's test.  SENSORY EXAM: Decreased sensation in the median nerve distribution Meds ordered this encounter  Medications   gabapentin (NEURONTIN) 100 MG capsule    Sig: Take 1 capsule (100 mg total) by mouth 3 (three) times daily.    Dispense:  90 capsule    Refill:  2   pyridOXINE (VITAMIN B6) 100 MG tablet    Sig: Take 1 tablet (100 mg total) by mouth 2 (two) times daily.    Dispense:  90 tablet    Refill:  0

## 2023-02-20 ENCOUNTER — Other Ambulatory Visit (HOSPITAL_COMMUNITY): Payer: Self-pay | Admitting: Internal Medicine

## 2023-02-20 DIAGNOSIS — Z1231 Encounter for screening mammogram for malignant neoplasm of breast: Secondary | ICD-10-CM

## 2023-02-26 ENCOUNTER — Other Ambulatory Visit: Payer: Self-pay | Admitting: "Endocrinology

## 2023-02-26 ENCOUNTER — Telehealth: Payer: Self-pay | Admitting: "Endocrinology

## 2023-02-26 DIAGNOSIS — C73 Malignant neoplasm of thyroid gland: Secondary | ICD-10-CM

## 2023-02-26 DIAGNOSIS — E890 Postprocedural hypothyroidism: Secondary | ICD-10-CM

## 2023-02-26 NOTE — Telephone Encounter (Signed)
Pt did her labs 5/20. Are those sufficient enough for her 7/29 appt, or do you want her to repeat?

## 2023-03-05 ENCOUNTER — Ambulatory Visit (HOSPITAL_COMMUNITY)
Admission: RE | Admit: 2023-03-05 | Discharge: 2023-03-05 | Disposition: A | Discharge status: Home / Self Care | Payer: Medicare HMO | Source: Ambulatory Visit | Attending: Internal Medicine | Admitting: Internal Medicine

## 2023-03-05 DIAGNOSIS — Z1231 Encounter for screening mammogram for malignant neoplasm of breast: Secondary | ICD-10-CM | POA: Insufficient documentation

## 2023-03-05 LAB — TSH: TSH: 2 u[IU]/mL (ref 0.450–4.500)

## 2023-03-05 LAB — COMPREHENSIVE METABOLIC PANEL
Albumin: 4.3 g/dL (ref 3.8–4.8)
Bilirubin Total: 0.3 mg/dL (ref 0.0–1.2)
CO2: 24 mmol/L (ref 20–29)
Calcium: 9.5 mg/dL (ref 8.7–10.3)
Chloride: 101 mmol/L (ref 96–106)
Creatinine, Ser: 0.89 mg/dL (ref 0.57–1.00)
Glucose: 80 mg/dL (ref 70–99)
eGFR: 67 mL/min/{1.73_m2} (ref >59)

## 2023-03-05 LAB — THYROGLOBULIN ANTIBODY

## 2023-03-05 LAB — THYROGLOBULIN LEVEL

## 2023-03-05 LAB — T4, FREE: Free T4: 1.42 ng/dL (ref 0.82–1.77)

## 2023-03-06 LAB — COMPREHENSIVE METABOLIC PANEL
ALT: 23 IU/L (ref 0–32)
AST: 23 IU/L (ref 0–40)
Alkaline Phosphatase: 99 IU/L (ref 44–121)
BUN/Creatinine Ratio: 19 (ref 12–28)
BUN: 17 mg/dL (ref 8–27)
Globulin, Total: 2.7 g/dL (ref 1.5–4.5)
Potassium: 4.4 mmol/L (ref 3.5–5.2)
Sodium: 139 mmol/L (ref 134–144)
Total Protein: 7 g/dL (ref 6.0–8.5)

## 2023-03-10 ENCOUNTER — Encounter: Payer: Self-pay | Admitting: "Endocrinology

## 2023-03-10 ENCOUNTER — Ambulatory Visit: Payer: Medicare HMO | Admitting: "Endocrinology

## 2023-03-10 VITALS — BP 122/80 | HR 100 | Ht 62.0 in | Wt 152.0 lb

## 2023-03-10 DIAGNOSIS — C73 Malignant neoplasm of thyroid gland: Secondary | ICD-10-CM

## 2023-03-10 DIAGNOSIS — E890 Postprocedural hypothyroidism: Secondary | ICD-10-CM | POA: Diagnosis not present

## 2023-03-10 MED ORDER — LEVOTHYROXINE SODIUM 100 MCG PO TABS: 100.0000 ug | ORAL_TABLET | Freq: Every day | ORAL | 1 refills | Status: DC

## 2023-03-10 NOTE — Progress Notes (Signed)
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 Elpidio Anis   COLONOSCOPY  2009   Dr. Darrick Penna: moderate internal hemorrhoids, frequent sigmoid colon diverticula, polyps X 3, one large adenoma otherwise hyperplastic   COLONOSCOPY N/A 09/09/2014   Procedure: COLONOSCOPY;  Surgeon: West Bali, Kristina Huang;  [facility]: [facility];  Service: Endoscopy;  Laterality: N/A;  200 - moved to 1/29 @ 12:45   COLONOSCOPY N/A 10/24/2017   Procedure: COLONOSCOPY;  Surgeon: West Bali, Kristina Huang;  Location: [facility];  Service: Endoscopy;  Laterality: N/A;  9:30   COLONOSCOPY WITH PROPOFOL N/A 12/15/2020   Procedure: COLONOSCOPY WITH PROPOFOL;  Surgeon: Lanelle Bal, DO;  Location: [facility];  Service: Endoscopy;  Laterality: N/A;  ASA II / AM procedure   ESOPHAGOGASTRODUODENOSCOPY N/A 09/09/2014   Procedure: ESOPHAGOGASTRODUODENOSCOPY (EGD);  Surgeon: West Bali, Kristina Huang;  [facility];  Service: Endoscopy;  Laterality: N/A;   EYE SURGERY Right    JOINT REPLACEMENT     LAMINECTOMY  1997   MAXIMUM ACCESS (MAS)POSTERIOR LUMBAR INTERBODY FUSION (PLIF) 2 LEVEL  11/2013   POSTERIOR LUMBAR INTERBODY FUSION POSTERIOR LATERAL ARTHRODESIS POSTERIOR NONSEGMENTAL INSTRUMENTATION  LUMBAR TWO-THREE ,LUMBAR THREE-FOUR WITH HARDWARE REMOVAL LUMBAR FOUR-FIVE/notes 11/17/2013   POLYPECTOMY  12/15/2020   Procedure: POLYPECTOMY;  Surgeon: Lanelle Bal, DO;  [facility]: [facility];  Service:  Endoscopy;;   THYROIDECTOMY N/A 02/05/2017   Procedure: THYROIDECTOMY;  Surgeon: Franky Macho, Kristina Huang;  Location: [facility];  Service: General;  Laterality: N/A;   TOTAL HIP ARTHROPLASTY Left 07/21/2018   TOTAL HIP ARTHROPLASTY Left 07/21/2018   Procedure: LEFT TOTAL HIP ARTHROPLASTY ANTERIOR APPROACH;  Surgeon: Kathryne Hitch, Kristina Huang;  Location: [facility];  Service: Orthopedics;  Laterality: Left;   TOTAL HIP ARTHROPLASTY Right 05/04/2019   Procedure: RIGHT TOTAL HIP ARTHROPLASTY ANTERIOR APPROACH;  Surgeon: Kathryne Hitch, Kristina Huang;  Location: [facility] OR;  Service: Orthopedics;  Laterality: Right;   TUBAL LIGATION     Social History   Socioeconomic History   Marital status: Divorced    Spouse name: Not on file   Number of children: Not on file   Years of education: Not on file   Highest education level: Not on file  Occupational History   Not on file  Tobacco Use   Smoking status: Some Days    Current packs/day: 0.00    Average packs/day: 0.3 packs/day for 2.0 years (0.5 ttl pk-yrs)    Types: Cigarettes    Start date: 02/01/2008    Last attempt to quit: 01/31/2010    Years since quitting: 13.1   Smokeless tobacco: Never  Vaping Use   Vaping status: Never Used  Substance and Sexual Activity   Alcohol use: No    Alcohol/week: 0.0 standard drinks of alcohol   Drug use: No   Sexual activity: Not Currently    Birth control/protection: Surgical  Other Topics Concern   Not on file  Social History Narrative   Not on file   Social Determinants of Health   Financial Resource Strain: Not on file  Food Insecurity: Not on file  Transportation Needs: Not on file  Physical Activity: Not on file  Stress: Not on file  Social Connections: Not on file   Outpatient Encounter Medications as of 03/10/2023  Medication Sig   amLODipine (NORVASC) 5 MG tablet Take 5 mg by mouth daily.   ACCU-CHEK AVIVA PLUS test strip    ACCU-CHEK SOFTCLIX LANCETS lancets    acetaminophen (TYLENOL) 650 MG CR  tablet Take 650-1,300 mg by mouth every 8 (eight) hours as needed for pain.   aspirin EC 81 MG tablet Take 81 mg by mouth every evening. Swallow whole.   atorvastatin (LIPITOR) 20 MG tablet Take 20 mg by mouth every evening.   diclofenac (VOLTAREN) 75 MG EC tablet Take 75 mg by mouth 2 (two) times daily.   gabapentin (NEURONTIN) 100 MG capsule Take 1 capsule (100 mg total) by mouth 3 (three) times daily. (Patient not taking: Reported on 03/10/2023)   levothyroxine (SYNTHROID) 100 MCG tablet Take 1 tablet (100 mcg total) by mouth daily before breakfast.   losartan (COZAAR) 100 MG tablet Take 100 mg by mouth every evening.   metFORMIN (GLUCOPHAGE) 500 MG tablet Take 500 mg by mouth daily with breakfast.   omeprazole (PRILOSEC) 20 MG capsule Take 20 mg by mouth in the morning.   pyridOXINE (VITAMIN B6) 100 MG tablet Take 1 tablet (100 mg total) by mouth 2 (two) times daily.   [DISCONTINUED] levothyroxine (SYNTHROID) 100 MCG tablet Take 1 tablet (100 mcg total) by mouth daily before breakfast.   No facility-administered encounter medications on file as of 03/10/2023.   ALLERGIES: No Known Allergies  VACCINATION STATUS: Immunization History  Administered Date(s) Administered   Fluad Quad(high Dose 65+) 05/06/2019   Pneumococcal Polysaccharide-23 05/06/2019    HPI  77 year old female patient with medical history as above.    She is returning for follow-up for history of thyroid malignancy and postsurgical hypothyroidism.  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 The left lobe was said to be consistent with hyperplastic nodule. - She underwent I-131 thyroid remnant ablation on 04/15/2017 followed by post therapy whole body scan. -  On the scan, she was found to have foci of abnormal  radioiodine accumulation in the cervical region, suggestive of thyroid remnant or metastatic cervical lymph node.  -Her subsequent thyroid/neck ultrasound  confirms decrease in size of this lymph node to 0.3 cm and no new concerns.  -She denies dysphagia, shortness of breath, nor voice change.  Her subsequent thyroid/neck ultrasound is negative for any residual thyroid tissue nor cervical adenopathy, in September 2020.  Prior to her October 2021 visit , in December 06, 2019 she underwent Thyrogen stimulated whole-body scan which did not show any evidence of tumor recurrence or distant metastasis.  Her most recent thyroid/neck ultrasound on April 19, 2021 did not show any evidence of tumor recurrence.  Her previsit thyroid/neck ultrasound on Dec 30, 2022 was unremarkable-see below.  -  She is currently on levothyroxine 100 mcg p.o. daily before breakfast.  She is compliant and reports consistency taking her medications.  Her previsit thyroid function tests are consistent with appropriate replacement.     She has no new complaints today.   - She denies any family history of thyroid cancer, however one of her sisters have hyperthyroidism status post treatment with what appears to be radioactive iodine therapy.    Objective:    BP 122/80   Pulse 100   Ht 5\' 2"  (1.575 m)   Wt 152 lb (68.9 kg)   BMI 27.80 kg/m   Wt Readings from Last 3 Encounters:  03/10/23 152 lb (68.9 kg)  02/03/23 152 lb (68.9 kg)  09/02/22 153 lb 6.4 oz (69.6 kg)       CMP ( most recent) CMP     Component Value Date/Time   NA 139 03/03/2023 1118   K 4.4 03/03/2023 1118   CL 101 03/03/2023 1118   CO2 24 03/03/2023 1118   GLUCOSE 80 03/03/2023 1118   GLUCOSE 128 (H) 05/07/2019 0355   BUN 17 03/03/2023 1118   CREATININE 0.89 03/03/2023 1118   CREATININE 0.80 03/26/2018 1041   CALCIUM 9.5 03/03/2023 1118   PROT 7.0 03/03/2023 1118   ALBUMIN 4.3 03/03/2023 1118   AST 23 03/03/2023 1118   ALT 23 03/03/2023  1118   ALKPHOS 99 03/03/2023 1118   BILITOT 0.3 03/03/2023 1118   GFRNONAA >60 05/07/2019 0355   GFRNONAA 74 03/26/2018 1041   GFRAA >60 05/07/2019 0355   GFRAA 85 03/26/2018 1041        Fine-needle aspiration cytology of the thyroid on 10/23/2016 showed: THYROID, FINE NEEDLE ASPIRATION, LEFT (SPECIMEN 1 OF 1, COLLECTED 10/23/16): CONSISTENT WITH BENIGN FOLLICULAR NODULE (BETHESDA CATEGORY II).  02/05/2017: She underwent total thyroidectomy: 02/05/2017: INAL DIAGNOSIS Diagnosis 1. Thyroid, lobectomy, left - NODULAR HYPERPLASIA (MULTINODULAR GOITER). 2. Thyroid, lobectomy, right - PAPILLARY THYROID CARCINOMA, TWO TUMORS, 0.5 AND 0.2 CM. - PAPILLARY CARCINOMA FOCALLY EXTENDS INTO THE PERITHYROIDAL CONNECTIVE TISSUE. - HURTHLE CELL ADENOMATOUS NODULE. - NODULAR HYPERPLASIA.  TNM code: pT1a , pNX.   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   No distant sites of iodine-avid metastatic thyroid cancer Identified.  Surveillance thyroid/neck ultrasound from 05/19/2017 showed:  Surgically absent thyroid lobes. A single morphologically unremarkable 0.8 cm left cervical lymph node is noted. No pathologic adenopathy.   IMPRESSION: No evidence of residual/ recurrent tissue post thyroidectomy   Thyroid/neck ultrasound on February 23, 2018 IMPRESSION: 1. Post total thyroidectomy without evidence of residual nodular tissue within the thyroidectomy resection bed. 2. Solitary left-sided cervical lymph node is not enlarged by size criteria measuring 0.3 cm in diameter. No definitive bulky regional cervical lymphadenopathy.     Thyroid/neck ultrasound on April 28, 2019 No discrete nodules are seen within the thyroidectomy bed. No regional adenopathy identified.    IMPRESSION: 1. No residual/recurrent tissue post thyroidectomy. No adenopathy identified.     Thyrogen stimulated whole-body scan on December 06, 2019  Resolution of previously identified abnormal uptake in the right cervical region.  No scintigraphic evidence of iodine - avid metastatic thyroid cancer.  April 19, 2021 thyroid/neck ultrasound FINDINGS: No residual thyroid tissue.  No enlarged neck lymph nodes.   IMPRESSION: No evidence of recurrent or residual thyroid malignancy.    Recent Results (from the past 2160 hour(s))  TSH  Status: None   Collection Time: 12/30/22  9:13 AM  Result Value Ref Range   TSH 1.300 0.450 - 4.500 uIU/mL  T4, free     Status: None   Collection Time: 12/30/22  9:13 AM  Result Value Ref Range   Free T4 1.72 0.82 - 1.77 ng/dL  Thyroglobulin antibody     Status: None   Collection Time: 12/30/22  9:13 AM  Result Value Ref Range   Thyroglobulin Antibody <1.0 0.0 - 0.9 IU/mL    Comment: Thyroglobulin Antibody measured by [employer] Methodology It should be noted that the presence of thyroglobulin antibodies may not be pathogenic nor diagnostic, especially at very low levels. The assay manufacturer has found that four percent of individuals without evidence of thyroid disease or autoimmunity will have positive TgAb levels up to 4 IU/mL.   Thyroglobulin Level     Status: None   Collection Time: 12/30/22  9:13 AM  Result Value Ref Range   Thyroglobulin (TG-RIA) <2.0 ng/mL    Comment: This test was developed and its performance characteristics determined by [facility]. It has not been cleared or approved by the Food and Drug Administration. Reference Range: Pubertal Children and Adults: <40 According to the [facility] of Clinical Biochemistry, the reference interval for Thyroglobulin (TG) should be related to euthyroid patients and not for patients who underwent thyroidectomy.  TG reference intervals for these patients depend on the residual mass of the thyroid tissue left after surgery.  Establishing a post-operative  baseline is recommended.  The assay quantitation limit is 2.0 ng/mL.   TSH     Status: None   Collection Time: 03/03/23 11:18 AM  Result Value Ref Range   TSH 2.000 0.450 - 4.500 uIU/mL  T4, free     Status: None   Collection Time: 03/03/23 11:18 AM  Result Value Ref Range   Free T4 1.42 0.82 - 1.77 ng/dL  Comprehensive metabolic panel     Status: None   Collection Time: 03/03/23 11:18 AM  Result Value Ref Range   Glucose 80 70 - 99 mg/dL   BUN 17 8 - 27 mg/dL   Creatinine, Ser 1.61 0.57 - 1.00 mg/dL   eGFR 67 >09 UE/AVW/0.98   BUN/Creatinine Ratio 19 12 - 28   Sodium 139 134 - 144 mmol/L   Potassium 4.4 3.5 - 5.2 mmol/L   Chloride 101 96 - 106 mmol/L   CO2 24 20 - 29 mmol/L   Calcium 9.5 8.7 - 10.3 mg/dL   Total Protein 7.0 6.0 - 8.5 g/dL   Albumin 4.3 3.8 - 4.8 g/dL   Globulin, Total 2.7 1.5 - 4.5 g/dL   Bilirubin Total 0.3 0.0 - 1.2 mg/dL   Alkaline Phosphatase 99 44 - 121 IU/L   AST 23 0 - 40 IU/L   ALT 23 0 - 32 IU/L  Thyroglobulin antibody     Status: None   Collection Time: 03/03/23 11:18 AM  Result Value Ref Range   Thyroglobulin Antibody <1.0 0.0 - 0.9 IU/mL    Comment: Thyroglobulin Antibody measured by [employer] Methodology It should be noted that the presence of thyroglobulin antibodies may not be pathogenic nor diagnostic, especially at very low levels. The assay manufacturer has found that four percent of individuals without evidence of thyroid disease or autoimmunity will have positive TgAb levels up to 4 IU/mL.   Thyroglobulin Level     Status: None (Preliminary result)   Collection Time: 03/03/23 11:18 AM  Result Value Ref  Range   Thyroglobulin (TG-RIA) WILL FOLLOW     Thyroid/neck ultrasound on Dec 30, 2022 IMPRESSION: No evidence of recurrent or residual thyroid malignancy.   The above is in keeping with the ACR TI-RADS recommendations - J Am Coll Radiol 2017;14:587-595.  Assessment & Plan:   1. 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 Her surgical sample unexpectedly revealed occult malignancy on the right lobe of the thyroid, 0.5 cm  with focal extrathyroidal extension and 0.2 cm foci.  TNM code: pT1a , pNX. - She is status post total thyroidectomy and I-131 thyroid remnant ablation on 04/15/2017.  - Thyroglobulin level and thyroglobulin antibodies are  low  on the day of or body scan. 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   No distant sites of iodine-avid metastatic thyroid cancer Identified.  - Surveillance  thyroid/neck ultrasound - is unremarkable except for nonspecific 0.8 cm lymph node on the left cervical region.   Subsequent thyroid/neck ultrasound on February 23, 2018 showed decreasing size of his nonspecific lymph node to 0.3 cm. -Her most recent thyroid/neck ultrasound is negative for residual thyroid tissue nor thyroid lymphadenopathy.    -Her previsit Thyrogen stimulated whole-body scan-is negative for iodine avid distant metastasis, resolution of previously documented uptake in the cervical region.   -Her last thyroid/neck ultrasound from September 2022 did not show any evidence of tumor recurrence.    Her previsit thyroid/neck ultrasound on Dec 30, 2022 was unremarkable-see above . 2. Postsurgical hypothyroidism  Her previsit thyroid function tests are consistent with appropriate replacement.  She is advised to continue levothyroxine 100 mcg p.o. daily before breakfast.     - We discussed about the correct intake of her thyroid hormone, on empty stomach at fasting, with water, separated by at least 30 minutes from breakfast and other medications,  and separated by more than 4 hours from calcium, iron, multivitamins, acid reflux medications (PPIs). -Patient is made aware  of the fact that thyroid hormone replacement is needed for life, dose to be adjusted by periodic monitoring of thyroid function tests.   - I advised patient to maintain close follow up with Kristina Filbert, Kristina Huang for primary care needs.    I spent  22  minutes in the care of the patient today including review of labs from Thyroid Function, CMP, and other relevant labs ; imaging/biopsy records (current and previous including abstractions from other facilities); face-to-face time discussing  her lab results and symptoms, medications doses, her options of short and long term treatment based on the latest standards of care / guidelines;   and documenting the encounter.  Kristina Huang  participated in the discussions, expressed understanding, and voiced agreement with the above plans.  All questions were answered to her satisfaction. she is encouraged to contact clinic should she have any questions or concerns prior to her return visit.   Follow up plan: Return in about 6 months (around 09/10/2023) for F/U with Pre-visit Labs.  Marquis Lunch, Kristina Huang Phone: [phone]  Fax: [phone]  This note was partially dictated with voice recognition software. Similar sounding words can be transcribed inadequately or may not  be corrected upon review.  03/10/2023, 9:14 AM

## 2023-08-19 ENCOUNTER — Other Ambulatory Visit: Payer: Self-pay | Admitting: "Endocrinology

## 2023-09-10 ENCOUNTER — Encounter: Payer: Self-pay | Admitting: "Endocrinology

## 2023-09-10 ENCOUNTER — Ambulatory Visit: Payer: Medicare HMO | Admitting: "Endocrinology

## 2023-09-10 VITALS — BP 136/88 | HR 84 | Ht 62.0 in | Wt 154.2 lb

## 2023-09-10 DIAGNOSIS — E890 Postprocedural hypothyroidism: Secondary | ICD-10-CM

## 2023-09-10 DIAGNOSIS — C73 Malignant neoplasm of thyroid gland: Secondary | ICD-10-CM

## 2023-09-10 NOTE — Progress Notes (Signed)
09/10/2023         Endocrinology follow-up note   Subjective:    Patient ID: Kristina Huang, female    DOB: 07/02/1946, PCP Alvina Filbert, MD   Past Medical History:  Diagnosis Date   Acid reflux    Arthritis    Cancer [facility])    Thyroid   Diabetes mellitus without complication (HCC)    fasting 100-130   Hypercholesterolemia    Hypertension    Thyroid goiter    right tracheal deviation by CXR 10/2013   Past Surgical History:  Procedure Laterality Date   BACK SURGERY     BUNIONECTOMY Bilateral    CHOLECYSTECTOMY     Dr. Elpidio Anis   COLONOSCOPY  2009   Dr. Darrick Penna: moderate internal hemorrhoids, frequent sigmoid colon diverticula, polyps X 3, one large adenoma otherwise hyperplastic   COLONOSCOPY N/A 09/09/2014   Procedure: COLONOSCOPY;  Surgeon: West Bali, MD;  Location: [facility];  Service: Endoscopy;  Laterality: N/A;  200 - moved to 1/29 @ 12:45   COLONOSCOPY N/A 10/24/2017   Procedure: COLONOSCOPY;  Surgeon: West Bali, MD;  Location: [facility];  Service: Endoscopy;  Laterality: N/A;  9:30   COLONOSCOPY WITH PROPOFOL N/A 12/15/2020   Procedure: COLONOSCOPY WITH PROPOFOL;  Surgeon: Lanelle Bal, DO;  Location: [facility];  Service: Endoscopy;  Laterality: N/A;  ASA II / AM procedure   ESOPHAGOGASTRODUODENOSCOPY N/A 09/09/2014   Procedure: ESOPHAGOGASTRODUODENOSCOPY (EGD);  Surgeon: West Bali, MD;  [facility]: [facility];  Service: Endoscopy;  Laterality: N/A;   EYE SURGERY Right    JOINT REPLACEMENT     LAMINECTOMY  1997   MAXIMUM ACCESS (MAS)POSTERIOR LUMBAR INTERBODY FUSION (PLIF) 2 LEVEL  11/2013   POSTERIOR LUMBAR INTERBODY FUSION POSTERIOR LATERAL ARTHRODESIS POSTERIOR NONSEGMENTAL INSTRUMENTATION  LUMBAR TWO-THREE ,LUMBAR THREE-FOUR WITH HARDWARE REMOVAL LUMBAR FOUR-FIVE/notes 11/17/2013   POLYPECTOMY  12/15/2020   Procedure: POLYPECTOMY;  Surgeon: Lanelle Bal, DO;  [facility]: [facility];  Service:  Endoscopy;;   THYROIDECTOMY N/A 02/05/2017   Procedure: THYROIDECTOMY;  Surgeon: Franky Macho, MD;  Location: [facility] ORS;  Service: General;  Laterality: N/A;   TOTAL HIP ARTHROPLASTY Left 07/21/2018   TOTAL HIP ARTHROPLASTY Left 07/21/2018   Procedure: LEFT TOTAL HIP ARTHROPLASTY ANTERIOR APPROACH;  Surgeon: Kathryne Hitch, MD;  Location: [facility] OR;  Service: Orthopedics;  Laterality: Left;   TOTAL HIP ARTHROPLASTY Right 05/04/2019   Procedure: RIGHT TOTAL HIP ARTHROPLASTY ANTERIOR APPROACH;  Surgeon: Kathryne Hitch, MD;  Location: [facility] OR;  Service: Orthopedics;  Laterality: Right;   TUBAL LIGATION     Social History   Socioeconomic History   Marital status: Divorced    Spouse name: Not on file   Number of children: Not on file   Years of education: Not on file   Highest education level: Not on file  Occupational History   Not on file  Tobacco Use   Smoking status: Some Days    Current packs/day: 0.00    Average packs/day: 0.3 packs/day for 2.0 years (0.5 ttl pk-yrs)    Types: Cigarettes    Start date: 02/01/2008    Last attempt to quit: 01/31/2010    Years since quitting: 13.6   Smokeless tobacco: Never  Vaping Use   Vaping status: Never Used  Substance and Sexual Activity   Alcohol use: No    Alcohol/week: 0.0 standard drinks of alcohol   Drug use: No   Sexual activity: Not Currently    Birth control/protection: Surgical  Other Topics Concern   Not on file  Social History Narrative   Not on file   Social Drivers of Health   Financial Resource Strain: Not on file  Food Insecurity: Not on file  Transportation Needs: Not on file  Physical Activity: Not on file  Stress: Not on file  Social Connections: Not on file   Outpatient Encounter Medications as of 09/10/2023  Medication Sig   ACCU-CHEK AVIVA PLUS test strip    ACCU-CHEK SOFTCLIX LANCETS lancets    acetaminophen (TYLENOL) 650 MG CR tablet Take 650-1,300 mg by mouth every 8 (eight) hours as needed for  pain.   amLODipine (NORVASC) 5 MG tablet Take 5 mg by mouth daily.   aspirin EC 81 MG tablet Take 81 mg by mouth every evening. Swallow whole.   atorvastatin (LIPITOR) 20 MG tablet Take 20 mg by mouth every evening.   diclofenac (VOLTAREN) 75 MG EC tablet Take 75 mg by mouth 2 (two) times daily.   gabapentin (NEURONTIN) 100 MG capsule Take 1 capsule (100 mg total) by mouth 3 (three) times daily. (Patient not taking: Reported on 03/10/2023)   levothyroxine (SYNTHROID) 100 MCG tablet TAKE 1 TABLET BY MOUTH ONCE DAILY BEFORE BREAKFAST . APPOINTMENT REQUIRED FOR FUTURE REFILLS   losartan (COZAAR) 100 MG tablet Take 100 mg by mouth every evening.   metFORMIN (GLUCOPHAGE) 500 MG tablet Take 500 mg by mouth daily with breakfast.   omeprazole (PRILOSEC) 20 MG capsule Take 20 mg by mouth in the morning.   pyridOXINE (VITAMIN B6) 100 MG tablet Take 1 tablet (100 mg total) by mouth 2 (two) times daily.   No facility-administered encounter medications on file as of 09/10/2023.   ALLERGIES: No Known Allergies  VACCINATION STATUS: Immunization History  Administered Date(s) Administered   Fluad Quad(high Dose 65+) 05/06/2019   Pneumococcal Polysaccharide-23 05/06/2019    HPI  78 year old female patient with medical history as above.    She is returning for follow-up for history of thyroid malignancy and postsurgical hypothyroidism.  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 The left lobe was said to be consistent with hyperplastic nodule. - She underwent I-131 thyroid remnant ablation on 04/15/2017 followed by post therapy whole body scan. -  On the scan, she was found to have foci of abnormal radioiodine accumulation in the cervical region, suggestive of thyroid remnant or metastatic cervical lymph  node.  -Her subsequent thyroid/neck ultrasound  confirms decrease in size of this lymph node to 0.3 cm and no new concerns.  -She denies dysphagia, shortness of breath, nor voice change.  Her subsequent thyroid/neck ultrasound is negative for any residual thyroid tissue nor cervical adenopathy, in September 2020.  Prior to her October 2021 visit , in December 06, 2019 she underwent Thyrogen stimulated whole-body scan which did not show any evidence of tumor recurrence or distant metastasis.  Her most recent thyroid/neck ultrasound on April 19, 2021 did not show any evidence of tumor recurrence.  Her previsit thyroid/neck ultrasound on Dec 30, 2022 was unremarkable-see below.  -She is currently on levothyroxine 100 mcg p.o. daily before breakfast.  She is  compliant and reports consistency taking her medication.  Her previsit thyroid function tests are consistent with appropriate replacement.  She has no new complaints today.   - She denies any family history of thyroid cancer, however one of her sisters have hyperthyroidism status post treatment with what appears to be radioactive iodine therapy.    Objective:    BP 136/88   Pulse 84   Ht 5\' 2"  (1.575 m)   Wt 154 lb 3.2 oz (69.9 kg)   BMI 28.20 kg/m   Wt Readings from Last 3 Encounters:  09/10/23 154 lb 3.2 oz (69.9 kg)  03/10/23 152 lb (68.9 kg)  02/03/23 152 lb (68.9 kg)       CMP ( most recent) CMP     Component Value Date/Time   NA 139 03/03/2023 1118   K 4.4 03/03/2023 1118   CL 101 03/03/2023 1118   CO2 24 03/03/2023 1118   GLUCOSE 80 03/03/2023 1118   GLUCOSE 128 (H) 05/07/2019 0355   BUN 17 03/03/2023 1118   CREATININE 0.89 03/03/2023 1118   CREATININE 0.80 03/26/2018 1041   CALCIUM 9.5 03/03/2023 1118   PROT 7.0 03/03/2023 1118   ALBUMIN 4.3 03/03/2023 1118   AST 23 03/03/2023 1118   ALT 23 03/03/2023 1118   ALKPHOS 99 03/03/2023 1118   BILITOT 0.3 03/03/2023 1118   GFRNONAA >60 05/07/2019 0355    GFRNONAA 74 03/26/2018 1041   GFRAA >60 05/07/2019 0355   GFRAA 85 03/26/2018 1041        Fine-needle aspiration cytology of the thyroid on 10/23/2016 showed: THYROID, FINE NEEDLE ASPIRATION, LEFT (SPECIMEN 1 OF 1, COLLECTED 10/23/16): CONSISTENT WITH BENIGN FOLLICULAR NODULE (BETHESDA CATEGORY II).  02/05/2017: She underwent total thyroidectomy: 02/05/2017: INAL DIAGNOSIS Diagnosis 1. Thyroid, lobectomy, left - NODULAR HYPERPLASIA (MULTINODULAR GOITER). 2. Thyroid, lobectomy, right - PAPILLARY THYROID CARCINOMA, TWO TUMORS, 0.5 AND 0.2 CM. - PAPILLARY CARCINOMA FOCALLY EXTENDS INTO THE PERITHYROIDAL CONNECTIVE TISSUE. - HURTHLE CELL ADENOMATOUS NODULE. - NODULAR HYPERPLASIA.  TNM code: pT1a , pNX.   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   No distant sites of iodine-avid metastatic thyroid cancer Identified.  Surveillance thyroid/neck ultrasound from 05/19/2017 showed:  Surgically absent thyroid lobes. A single morphologically unremarkable 0.8 cm left cervical lymph node is noted. No pathologic adenopathy.   IMPRESSION: No evidence of residual/ recurrent tissue post thyroidectomy   Thyroid/neck ultrasound on February 23, 2018 IMPRESSION: 1. Post total thyroidectomy without evidence of residual nodular tissue within the thyroidectomy resection bed. 2. Solitary left-sided cervical lymph node is not enlarged by size criteria measuring 0.3 cm in diameter. No definitive bulky regional cervical lymphadenopathy.     Thyroid/neck ultrasound on April 28, 2019 No discrete nodules are seen within the thyroidectomy bed. No regional adenopathy identified.    IMPRESSION: 1. No residual/recurrent tissue post thyroidectomy. No adenopathy identified.    Thyrogen stimulated whole-body scan on December 06, 2019  Resolution of previously identified abnormal  uptake in the right cervical region.  No scintigraphic evidence of iodine - avid metastatic thyroid cancer.  April 19, 2021 thyroid/neck ultrasound FINDINGS: No residual thyroid tissue.  No enlarged neck lymph nodes.   IMPRESSION: No evidence of recurrent or residual thyroid malignancy.    Recent Results (from the past 2160 hours)  TSH     Status: None   Collection Time: 09/03/23  1:01 PM  Result Value Ref  Range   TSH 3.300 0.450 - 4.500 uIU/mL  T4, free     Status: None   Collection Time: 09/03/23  1:01 PM  Result Value Ref Range   Free T4 1.46 0.82 - 1.77 ng/dL  Thyroglobulin antibody     Status: None   Collection Time: 09/03/23  1:01 PM  Result Value Ref Range   Thyroglobulin Antibody <1.0 0.0 - 0.9 IU/mL    Comment: Thyroglobulin Antibody measured by [employer] Methodology It should be noted that the presence of thyroglobulin antibodies may not be pathogenic nor diagnostic, especially at very low levels. The assay manufacturer has found that four percent of individuals without evidence of thyroid disease or autoimmunity will have positive TgAb levels up to 4 IU/mL.   Thyroglobulin Level     Status: None (Preliminary result)   Collection Time: 09/03/23  1:01 PM  Result Value Ref Range   Thyroglobulin (TG-RIA) WILL FOLLOW     Thyroid/neck ultrasound on Dec 30, 2022 IMPRESSION: No evidence of recurrent or residual thyroid malignancy.   The above is in keeping with the ACR TI-RADS recommendations - J Am Coll Radiol 2017;14:587-595.  Assessment & Plan:   1. 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 Her surgical sample unexpectedly revealed occult malignancy on the right lobe of the thyroid, 0.5 cm  with focal extrathyroidal extension and 0.2 cm foci.  TNM code: pT1a , pNX. - She is status post total thyroidectomy and I-131 thyroid remnant  ablation on 04/15/2017.  - Thyroglobulin level and thyroglobulin antibodies are  low  on the day of or body scan. 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   No distant sites of iodine-avid metastatic thyroid cancer Identified.  - Surveillance  thyroid/neck ultrasound - is unremarkable except for nonspecific 0.8 cm lymph node on the left cervical region.   Subsequent thyroid/neck ultrasound on February 23, 2018 showed decreasing size of his nonspecific lymph node to 0.3 cm. -Her most recent thyroid/neck ultrasound is negative for residual thyroid tissue nor thyroid lymphadenopathy.    -Her previsit Thyrogen stimulated whole-body scan-is negative for iodine avid distant metastasis, resolution of previously documented uptake in the cervical region.   -Her last thyroid/neck ultrasound from September 2022 did not show any evidence of tumor recurrence.    Her previsit thyroid/neck ultrasound on Dec 30, 2022 was unremarkable-see above . -She will be considered for 1 more thyroid/neck ultrasound after her next visit. 2. Postsurgical hypothyroidism  Her previsit thyroid function tests are consistent with appropriate replacement.  She is advised to continue levothyroxine 100 mcg p.o. daily before breakfast.     - We discussed about the correct intake of her thyroid hormone, on empty stomach at fasting, with water, separated by at least 30 minutes from breakfast and other medications,  and separated by more than 4 hours from calcium, iron, multivitamins, acid reflux medications (PPIs). -Patient is made aware of the fact that thyroid hormone replacement is needed for life, dose to be adjusted by periodic monitoring of thyroid function tests.  - I advised patient to maintain close follow up with Alvina Filbert, MD for primary care needs.    I spent  21  minutes in the care of the patient  today including review of labs from Thyroid Function, CMP, and  other relevant labs ; imaging/biopsy records (current and previous including abstractions from other facilities); face-to-face time discussing  her lab results and symptoms, medications doses, her options of short and long term treatment based on the latest standards of care / guidelines;   and documenting the encounter.  Kristina Huang  participated in the discussions, expressed understanding, and voiced agreement with the above plans.  All questions were answered to her satisfaction. she is encouraged to contact clinic should she have any questions or concerns prior to her return visit.    Follow up plan: Return in about 6 months (around 03/09/2024) for F/U with Pre-visit Labs.  Marquis Lunch, MD Phone: [phone]  Fax: [phone]  This note was partially dictated with voice recognition software. Similar sounding words can be transcribed inadequately or may not  be corrected upon review.  09/10/2023, 9:15 AM

## 2023-09-11 LAB — THYROGLOBULIN LEVEL: Thyroglobulin (TG-RIA): <2 ng/mL

## 2023-09-11 LAB — TSH: TSH: 3.3 u[IU]/mL (ref 0.450–4.500)

## 2023-09-11 LAB — THYROGLOBULIN ANTIBODY: Thyroglobulin Antibody: <1 [IU]/mL (ref 0.0–0.9)

## 2023-09-11 LAB — T4, FREE: Free T4: 1.46 ng/dL (ref 0.82–1.77)

## 2023-09-12 ENCOUNTER — Other Ambulatory Visit: Payer: Self-pay

## 2023-09-12 ENCOUNTER — Emergency Department (HOSPITAL_COMMUNITY)
Admission: EM | Admit: 2023-09-12 | Discharge: 2023-09-12 | Disposition: A | Discharge status: Home / Self Care | Payer: Medicare HMO | Attending: Emergency Medicine | Admitting: Emergency Medicine

## 2023-09-12 ENCOUNTER — Encounter (HOSPITAL_COMMUNITY): Payer: Self-pay

## 2023-09-12 DIAGNOSIS — J329 Chronic sinusitis, unspecified: Secondary | ICD-10-CM | POA: Diagnosis not present

## 2023-09-12 DIAGNOSIS — R0981 Nasal congestion: Secondary | ICD-10-CM | POA: Diagnosis present

## 2023-09-12 DIAGNOSIS — Z20822 Contact with and (suspected) exposure to covid-19: Secondary | ICD-10-CM | POA: Insufficient documentation

## 2023-09-12 LAB — RESP PANEL BY RT-PCR (RSV, FLU A&B, COVID)  RVPGX2
Influenza A by PCR: NEGATIVE
Influenza B by PCR: NEGATIVE
Resp Syncytial Virus by PCR: NEGATIVE
SARS Coronavirus 2 by RT PCR: NEGATIVE

## 2023-09-12 MED ORDER — FLUTICASONE PROPIONATE 50 MCG/ACT NA SUSP: 2.0000 | Freq: Every day | NASAL | 2 refills | Status: AC

## 2023-09-12 MED ORDER — LORATADINE 10 MG PO TABS: 10.0000 mg | ORAL_TABLET | Freq: Every day | ORAL | 0 refills | Status: AC

## 2023-09-12 NOTE — ED Provider Notes (Signed)
[place] EMERGENCY DEPARTMENT AT [facility] Provider Note   CSN: [id number] Arrival date & time: 09/12/23  1208     History  Chief Complaint  Patient presents with   Nasal Congestion    Kristina Huang is a 78 y.o. female, pertinent past medical history, who presents to the ED secondary to sinus pressure, nasal congestion, and intermittent rhinorrhea that has been going on for the last few weeks.  She states this is ongoing issue, but is not taking anything for it.  Used to be on Flonase and that used to help, but stopped taking it.  Denies any fevers, purulent discharge, from the nose, has only been taking over-the-counter cold/flu without any improvement. Home Medications Prior to Admission medications   Medication Sig Start Date End Date Taking? Authorizing Provider  fluticasone (FLONASE) 50 MCG/ACT nasal spray Place 2 sprays into both nostrils daily. 09/12/23  Yes Jakaree Pickard L, PA  loratadine (CLARITIN) 10 MG tablet Take 1 tablet (10 mg total) by mouth daily. 09/12/23  Yes Breven Guidroz, Harley Alto, PA  ACCU-CHEK AVIVA PLUS test strip  07/02/18   [provider]  ACCU-CHEK SOFTCLIX LANCETS lancets  07/02/18   [provider]  acetaminophen (TYLENOL) 650 MG CR tablet Take 650-1,300 mg by mouth every 8 (eight) hours as needed for pain.    [provider]  amLODipine (NORVASC) 5 MG tablet Take 5 mg by mouth daily. 02/05/23   [provider]  aspirin EC 81 MG tablet Take 81 mg by mouth every evening. Swallow whole.    [provider]  atorvastatin (LIPITOR) 20 MG tablet Take 20 mg by mouth every evening. 10/06/20   [provider]  diclofenac (VOLTAREN) 75 MG EC tablet Take 75 mg by mouth 2 (two) times daily.    [provider]  gabapentin (NEURONTIN) 100 MG capsule Take 1 capsule (100 mg total) by mouth 3 (three) times daily. Patient not taking: Reported on 03/10/2023 02/03/23   Vickki Hearing, MD  levothyroxine  (SYNTHROID) 100 MCG tablet TAKE 1 TABLET BY MOUTH ONCE DAILY BEFORE BREAKFAST . APPOINTMENT REQUIRED FOR FUTURE REFILLS 08/19/23   Roma Kayser, MD  losartan (COZAAR) 100 MG tablet Take 100 mg by mouth every evening. 10/06/20   [provider]  metFORMIN (GLUCOPHAGE) 500 MG tablet Take 500 mg by mouth daily with breakfast.    [provider]  omeprazole (PRILOSEC) 20 MG capsule Take 20 mg by mouth in the morning.    [provider]  pyridOXINE (VITAMIN B6) 100 MG tablet Take 1 tablet (100 mg total) by mouth 2 (two) times daily. 02/03/23   Vickki Hearing, MD      Allergies    Patient has no known allergies.    Review of Systems   Review of Systems  Constitutional:  Negative for fever.  HENT:  Positive for sinus pressure.     Physical Exam Updated Vital Signs BP (!) 157/99 (BP Location: Right Arm)   Pulse 88   Temp 97.8 F (36.6 C) (Oral)   Resp 16   Ht 5\' 2"  (1.575 m)   Wt 69.9 kg   SpO2 98%   BMI 28.19 kg/m  Physical Exam Vitals and nursing note reviewed.  Constitutional:      General: She is not in acute distress.    Appearance: She is well-developed.  HENT:     Head: Normocephalic and atraumatic.     Right Ear: Tympanic membrane normal.  Left Ear: Tympanic membrane normal.     Nose:     Comments: Enlarged nasal turbinates bilateral naris.  No polyps noted.  Mild ttp of ethmoid sinuses. No purulent discharge noted.  Eyes:     Conjunctiva/sclera: Conjunctivae normal.  Cardiovascular:     Rate and Rhythm: Normal rate and regular rhythm.     Heart sounds: No murmur heard. Pulmonary:     Effort: Pulmonary effort is normal. No respiratory distress.     Breath sounds: Normal breath sounds.  Abdominal:     Palpations: Abdomen is soft.     Tenderness: There is no abdominal tenderness.  Musculoskeletal:        General: No swelling.     Cervical back: Neck supple.  Skin:    General: Skin is warm and dry.     Capillary Refill:  Capillary refill takes less than 2 seconds.  Neurological:     Mental Status: She is alert.  Psychiatric:        Mood and Affect: Mood normal.     ED Results / Procedures / Treatments   Labs (all labs ordered are listed, but only abnormal results are displayed) Labs Reviewed  RESP PANEL BY RT-PCR (RSV, FLU A&B, COVID)  [id number]    EKG None  Radiology No results found.  Procedures Procedures    Medications Ordered in ED Medications - No data to display  ED Course/ Medical Decision Making/ A&P                                 Medical Decision Making Patient is a 78 year old female, here for nasal pressure, as well as nasal congestion that is been going on for the last couple weeks.  She states has been getting worse.  Is a chronic issue, but worse last couple weeks.  She has tenderness to palpation to the ethmoid sinuses, however no purulent discharge or fevers.  That she stopped taking her Flonase, I am suspicious for some type of sinusitis, likely secondary to allergies.  I will start her on some Flonase, and an antihistamine, to help with her symptoms, instruct her to follow-up with the PCP.  Risk OTC drugs.   Final Clinical Impression(s) / ED Diagnoses Final diagnoses:  Chronic sinusitis, unspecified location    Rx / DC Orders ED Discharge Orders          Ordered    fluticasone (FLONASE) 50 MCG/ACT nasal spray  Daily        09/12/23 1454    loratadine (CLARITIN) 10 MG tablet  Daily        09/12/23 1454              Antonyo Hinderer, [place], [place] 09/12/23 1455    Loetta Rough, MD 09/13/23 [phone]

## 2023-09-12 NOTE — ED Triage Notes (Signed)
Pt arrived via POV c/o sinus congestion X 3 weeks. Pt reports she is usually congested all year round. Pt endorses runny nose and intermittent sore throat.

## 2023-09-12 NOTE — Discharge Instructions (Signed)
I believe that you have allergies that are causing some inflammation in your sinuses.  I prescribed you for Flonase, as well as an antihistamine, to help with your symptoms.  Please follow-up with your PCP.  If you start having purulent drainage, or fevers please return to the ER

## 2023-11-14 ENCOUNTER — Other Ambulatory Visit: Payer: Self-pay | Admitting: "Endocrinology

## 2024-02-25 ENCOUNTER — Other Ambulatory Visit: Payer: Self-pay | Admitting: "Endocrinology

## 2024-03-09 ENCOUNTER — Encounter: Payer: Self-pay | Admitting: "Endocrinology

## 2024-03-09 ENCOUNTER — Ambulatory Visit: Payer: Medicare HMO | Admitting: "Endocrinology

## 2024-03-09 VITALS — BP 116/74 | HR 96 | Ht 62.0 in | Wt 150.4 lb

## 2024-03-09 DIAGNOSIS — E890 Postprocedural hypothyroidism: Secondary | ICD-10-CM | POA: Diagnosis not present

## 2024-03-09 DIAGNOSIS — C73 Malignant neoplasm of thyroid gland: Secondary | ICD-10-CM | POA: Diagnosis not present

## 2024-03-09 MED ORDER — LEVOTHYROXINE SODIUM 100 MCG PO TABS: 100.0000 ug | ORAL_TABLET | Freq: Every day | ORAL | 0 refills | Status: DC

## 2024-03-09 NOTE — Progress Notes (Signed)
 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 Keven Sharps   COLONOSCOPY  2009   Dr. Harvey: moderate internal hemorrhoids, frequent sigmoid colon diverticula, polyps X 3, one large adenoma otherwise hyperplastic   COLONOSCOPY N/A 09/09/2014   Procedure: COLONOSCOPY;  Surgeon: Margo LITTIE Harvey, MD;  Location: [facility];  Service: Endoscopy;  Laterality: N/A;  200 - moved to 1/29 @ 12:45   COLONOSCOPY N/A 10/24/2017   Procedure: COLONOSCOPY;  Surgeon: Harvey Margo LITTIE, MD;  Location: [facility];  Service: Endoscopy;  Laterality: N/A;  9:30   COLONOSCOPY WITH PROPOFOL  N/A 12/15/2020   Procedure: COLONOSCOPY WITH PROPOFOL ;  Surgeon: Cindie Carlin POUR, DO;  Location: [facility];  Service: Endoscopy;  Laterality: N/A;  ASA II / AM procedure   ESOPHAGOGASTRODUODENOSCOPY N/A 09/09/2014   Procedure: ESOPHAGOGASTRODUODENOSCOPY (EGD);  Surgeon: Margo LITTIE Harvey, MD;  Location: [facility];  Service: Endoscopy;  Laterality: N/A;   EYE SURGERY Right    JOINT REPLACEMENT     LAMINECTOMY  1997   MAXIMUM ACCESS (MAS)POSTERIOR LUMBAR INTERBODY FUSION (PLIF) 2 LEVEL  11/2013   POSTERIOR LUMBAR INTERBODY FUSION POSTERIOR LATERAL ARTHRODESIS POSTERIOR NONSEGMENTAL INSTRUMENTATION  LUMBAR TWO-THREE ,LUMBAR THREE-FOUR WITH HARDWARE REMOVAL LUMBAR FOUR-FIVE/notes 11/17/2013   POLYPECTOMY  12/15/2020   Procedure: POLYPECTOMY;  Surgeon: Cindie Carlin POUR, DO;  Location: [facility];  Service:  Endoscopy;;   THYROIDECTOMY N/A 02/05/2017   Procedure: THYROIDECTOMY;  Surgeon: Mavis Anes, MD;  Location: [facility];  Service: General;  Laterality: N/A;   TOTAL HIP ARTHROPLASTY Left 07/21/2018   TOTAL HIP ARTHROPLASTY Left 07/21/2018   Procedure: LEFT TOTAL HIP ARTHROPLASTY ANTERIOR APPROACH;  Surgeon: Vernetta Lonni GRADE, MD;  Location: [facility];  Service: Orthopedics;  Laterality: Left;   TOTAL HIP ARTHROPLASTY Right 05/04/2019   Procedure: RIGHT TOTAL HIP ARTHROPLASTY ANTERIOR APPROACH;  Surgeon: Vernetta Lonni GRADE, MD;  Location: [facility];  Service: Orthopedics;  Laterality: Right;   TUBAL LIGATION     Social History   Socioeconomic History   Marital status: Divorced    Spouse name: Not on file   Number of children: Not on file   Years of education: Not on file   Highest education level: Not on file  Occupational History   Not on file  Tobacco Use   Smoking status: Some Days    Current packs/day: 0.00    Average packs/day: 0.3 packs/day for 2.0 years (0.5 ttl pk-yrs)    Types: Cigarettes    Start date: 02/01/2008    Last attempt to quit: 01/31/2010    Years since quitting: 14.1   Smokeless tobacco: Never  Vaping Use   Vaping status: Never Used  Substance and Sexual Activity   Alcohol use: No    Alcohol/week: 0.0 standard drinks of alcohol   Drug use: No   Sexual activity: Not Currently    Birth control/protection: Surgical  Other Topics Concern   Not on file  Social History Narrative   Not on file   Social Drivers of Health   Financial Resource Strain: Not on file  Food Insecurity: Not on file  Transportation Needs: Not on file  Physical Activity: Not on file  Stress: Not on file  Social Connections: Not on file   Outpatient Encounter Medications as of 03/09/2024  Medication Sig   ACCU-CHEK AVIVA PLUS test strip    ACCU-CHEK SOFTCLIX LANCETS lancets    acetaminophen  (TYLENOL ) 650 MG CR tablet Take 650-1,300 mg by mouth every 8 (eight) hours as needed for  pain.   amLODipine (NORVASC) 5 MG tablet Take 5 mg by mouth daily.   aspirin  EC 81 MG tablet Take 81 mg by mouth every evening. Swallow whole.   atorvastatin (LIPITOR) 20 MG tablet Take 20 mg by mouth every evening.   diclofenac  (VOLTAREN ) 75 MG EC tablet Take 75 mg by mouth 2 (two) times daily.   fluticasone  (FLONASE ) 50 MCG/ACT nasal spray Place 2 sprays into both nostrils daily.   gabapentin  (NEURONTIN ) 100 MG capsule Take 1 capsule (100 mg total) by mouth 3 (three) times daily. (Patient not taking: Reported on 03/10/2023)   levothyroxine  (SYNTHROID ) 100 MCG tablet Take 1 tablet (100 mcg total) by mouth daily before breakfast.   loratadine  (CLARITIN ) 10 MG tablet Take 1 tablet (10 mg total) by mouth daily.   losartan  (COZAAR ) 100 MG tablet Take 100 mg by mouth every evening.   metFORMIN  (GLUCOPHAGE ) 500 MG tablet Take 500 mg by mouth daily with breakfast.   omeprazole (PRILOSEC) 20 MG capsule Take 20 mg by mouth in the morning.   pyridOXINE (VITAMIN B6) 100 MG tablet Take 1 tablet (100 mg total) by mouth 2 (two) times daily.   [DISCONTINUED] levothyroxine  (SYNTHROID ) 100 MCG tablet TAKE 1 TABLET BY MOUTH ONCE DAILY BEFORE BREAKFAST   No facility-administered encounter medications on file as of 03/09/2024.   ALLERGIES: No Known Allergies  VACCINATION STATUS: Immunization History  Administered Date(s) Administered   Fluad Quad(high Dose 65+) 05/06/2019   Pneumococcal Polysaccharide-23 05/06/2019    HPI  78 year old female patient with medical history as above.    She is returning for follow-up for history of thyroid  malignancy and postsurgical hypothyroidism.  - She underwent total thyroidectomy on 02/05/2017 which revealed a locally invasive multifocal papillary thyroid  carcinoma on the right lower thyroid -0.5 cm foci with local extrathyroidal extension and another 0.2 cm separate tumor in the right lobe. The left lobe was said to be consistent with nodular hyperplasia.   - She  underwent I-131 thyroid  remnant ablation on 04/15/2017 followed by post therapy whole body scan. -  On the scan, she was found to have foci of abnormal radioiodine accumulation in the cervical region, suggestive of thyroid  remnant or metastatic cervical lymph node.  -Her subsequent thyroid /neck ultrasound  confirms decrease in size of this lymph node to 0.3 cm and no new concerns.  -She denies dysphagia, shortness of breath, nor voice change.  Her subsequent thyroid /neck ultrasound is negative for any residual thyroid  tissue nor cervical adenopathy, in September 2020.  Prior to her October 2021 visit , in December 06, 2019 she underwent Thyrogen  stimulated whole-body scan which did not show any evidence of tumor recurrence or distant  metastasis.  Her subsequent  thyroid /neck ultrasound on April 19, 2021 did not show any evidence of tumor recurrence.  Her last thyroid /neck ultrasound on Dec 30, 2022 was unremarkable-see below.  -She is currently on levothyroxine  100 mcg p.o. daily before breakfast    She is compliant and reports consistency taking her medication.  Her previsit thyroid  function tests are consistent with appropriate replacement.  She has no new complaints today.   - She denies any family history of thyroid  cancer, however one of her sisters have hyperthyroidism status post treatment with what appears to be radioactive iodine  therapy. - Patient continues to smoke.    Objective:    BP 116/74   Pulse 96   Ht 5' 2 (1.575 m)   Wt 150 lb 6.4 oz (68.2 kg)   BMI 27.51 kg/m   Wt Readings from Last 3 Encounters:  03/09/24 150 lb 6.4 oz (68.2 kg)  09/12/23 154 lb 1.6 oz (69.9 kg)  09/10/23 154 lb 3.2 oz (69.9 kg)       CMP ( most recent) CMP     Component Value Date/Time   NA 139 03/03/2023 1118   K 4.4 03/03/2023 1118   CL 101 03/03/2023 1118   CO2 24 03/03/2023 1118   GLUCOSE 80 03/03/2023 1118   GLUCOSE 128 (H) 05/07/2019 0355   BUN 17 03/03/2023 1118    CREATININE 0.89 03/03/2023 1118   CREATININE 0.80 03/26/2018 1041   CALCIUM  9.5 03/03/2023 1118   PROT 7.0 03/03/2023 1118   ALBUMIN  4.3 03/03/2023 1118   AST 23 03/03/2023 1118   ALT 23 03/03/2023 1118   ALKPHOS 99 03/03/2023 1118   BILITOT 0.3 03/03/2023 1118   GFRNONAA >60 05/07/2019 0355   GFRNONAA 74 03/26/2018 1041   GFRAA >60 05/07/2019 0355   GFRAA 85 03/26/2018 1041        Fine-needle aspiration cytology of the thyroid  on 10/23/2016 showed: THYROID , FINE NEEDLE ASPIRATION, LEFT (SPECIMEN 1 OF 1, COLLECTED 10/23/16): CONSISTENT WITH BENIGN FOLLICULAR NODULE (BETHESDA CATEGORY II).  02/05/2017: She underwent total thyroidectomy: 02/05/2017: INAL DIAGNOSIS Diagnosis 1. Thyroid , lobectomy, left - NODULAR HYPERPLASIA (MULTINODULAR GOITER). 2. Thyroid , lobectomy, right - PAPILLARY THYROID  CARCINOMA, TWO TUMORS, 0.5 AND 0.2 CM. - PAPILLARY CARCINOMA FOCALLY EXTENDS INTO THE PERITHYROIDAL CONNECTIVE TISSUE. - HURTHLE CELL ADENOMATOUS NODULE. - NODULAR HYPERPLASIA.  TNM code: pT1a , pNX.   Post therapy or body scan : IMPRESSION: Uptake in the cervical region suspect representing a combination of thyroid  remnant and a metastatic cervical lymph node.   No distant sites of iodine -avid metastatic thyroid  cancer Identified.  Surveillance thyroid /neck ultrasound from 05/19/2017 showed:  Surgically absent thyroid  lobes. A single morphologically unremarkable 0.8 cm left cervical lymph node is noted. No pathologic adenopathy.   IMPRESSION: No evidence of residual/ recurrent tissue post thyroidectomy   Thyroid /neck ultrasound on February 23, 2018 IMPRESSION: 1. Post total thyroidectomy without evidence of residual nodular tissue within the thyroidectomy resection bed. 2. Solitary left-sided cervical lymph node is not enlarged by size criteria measuring 0.3 cm in diameter. No definitive bulky regional cervical lymphadenopathy.     Thyroid /neck ultrasound on April 28, 2019 No discrete nodules are seen within the thyroidectomy bed. No regional adenopathy identified.    IMPRESSION: 1. No residual/recurrent tissue post thyroidectomy. No adenopathy identified.    Thyrogen  stimulated whole-body scan on December 06, 2019  Resolution of previously identified abnormal uptake in the right cervical region.  No scintigraphic evidence of iodine  - avid metastatic thyroid  cancer.  April 19, 2021 thyroid /neck ultrasound FINDINGS: No residual thyroid  tissue.  No enlarged neck lymph nodes.   IMPRESSION: No evidence of recurrent or residual thyroid  malignancy.    Recent Results (from the past 2160 hours)  TSH     Status: None   Collection Time: 03/01/24 10:09 AM  Result Value Ref Range   TSH 3.290 0.450 - 4.500 uIU/mL  T4, free     Status: Abnormal   Collection Time: 03/01/24 10:09 AM  Result Value Ref Range   Free T4 1.84 (H) 0.82 - 1.77 ng/dL  Thyroglobulin antibody     Status: None   Collection Time: 03/01/24 10:09 AM  Result Value Ref Range   Thyroglobulin Antibody <1.0 0.0 - 0.9 IU/mL    Comment: Thyroglobulin Antibody measured by [employer] Methodology It should be noted that the presence of thyroglobulin antibodies may not be pathogenic nor diagnostic, especially at very low levels. The assay manufacturer has found that four percent of individuals without evidence of thyroid  disease or autoimmunity will have positive TgAb levels up to 4 IU/mL.   Thyroglobulin Level     Status: None (Preliminary result)   Collection Time: 03/01/24 10:09 AM  Result Value Ref Range   Thyroglobulin (TG-RIA) WILL FOLLOW     Thyroid /neck ultrasound on Dec 30, 2022 IMPRESSION: No evidence of recurrent or residual thyroid  malignancy.   The above is in keeping with the ACR TI-RADS recommendations - J Am Coll Radiol 2017;14:587-595.  Assessment & Plan:   1. 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 Her surgical sample unexpectedly revealed occult malignancy on the right lobe of the thyroid , 0.5 cm  with focal extrathyroidal extension and 0.2 cm foci.  TNM code: pT1a , pNX. - She is status post total thyroidectomy and I-131 thyroid  remnant ablation on 04/15/2017.  - Thyroglobulin level and thyroglobulin antibodies are  low  on the day of or body scan. - Whole-body scan IMPRESSION: Uptake in the cervical region suspect representing a combination of thyroid  remnant and a metastatic cervical lymph node.   No distant sites of iodine -avid metastatic thyroid  cancer Identified.  - Surveillance  thyroid /neck ultrasound - is unremarkable except for nonspecific 0.8 cm lymph node on the left cervical region.   Subsequent thyroid /neck ultrasound on February 23, 2018 showed decreasing size of his nonspecific lymph node to 0.3 cm. -Her most recent thyroid /neck ultrasound is negative for residual thyroid  tissue nor thyroid  lymphadenopathy.    -Her previsit Thyrogen  stimulated whole-body scan-is negative for iodine  avid distant metastasis, resolution of previously documented uptake in the cervical region.   -Her last thyroid /neck ultrasound from September 2022 did not show any evidence of tumor recurrence.    Her previsit thyroid /neck ultrasound on Dec 30, 2022 was unremarkable-see above . -She will be considered for 1 more thyroid /neck ultrasound before her next visit.      2. Postsurgical hypothyroidism  Her previsit thyroid  function tests are such that she would benefit from staying on her current suppressive dose of levothyroxine  100 mcg p.o. daily before breakfast.    - We discussed about the correct intake of her thyroid  hormone, on empty stomach at fasting, with water , separated by at least 30 minutes from breakfast and other medications,  and separated by more  than 4 hours from calcium , iron, multivitamins, acid reflux medications (PPIs). -Patient  is made aware of the fact that thyroid  hormone replacement is needed for life, dose to be adjusted by periodic monitoring of thyroid  function tests.   - I advised patient to maintain close follow up with Katrinka Aquas, MD for primary care needs.   I spent  22  minutes in the care of the patient today including review of labs from Thyroid  Function, CMP, and other relevant labs ; imaging/biopsy records (current and previous including abstractions from other facilities); face-to-face time discussing  her lab results and symptoms, medications doses, her options of short and long term treatment based on the latest standards of care / guidelines;   and documenting the encounter.  Kristina Huang  participated in the discussions, expressed understanding, and voiced agreement with the above plans.  All questions were answered to her satisfaction. she is encouraged to contact clinic should she have any questions or concerns prior to her return visit.   Follow up plan: Return in about 6 months (around 09/09/2024) for Thyroid  / Neck Ultrasound, Fasting Labs  in AM B4 8.  Ranny Earl, MD Phone: [phone]  Fax: [phone]  This note was partially dictated with voice recognition software. Similar sounding words can be transcribed inadequately or may not  be corrected upon review.  03/09/2024, 9:15 AM

## 2024-03-11 LAB — T4, FREE: Free T4: 1.84 ng/dL — ABNORMAL HIGH (ref 0.82–1.77)

## 2024-03-11 LAB — TSH: TSH: 3.29 u[IU]/mL (ref 0.450–4.500)

## 2024-03-11 LAB — THYROGLOBULIN ANTIBODY: Thyroglobulin Antibody: <1 [IU]/mL (ref 0.0–0.9)

## 2024-03-11 LAB — THYROGLOBULIN LEVEL: Thyroglobulin (TG-RIA): <2 ng/mL

## 2024-03-29 ENCOUNTER — Other Ambulatory Visit (HOSPITAL_COMMUNITY): Payer: Self-pay | Admitting: Internal Medicine

## 2024-03-29 DIAGNOSIS — Z1231 Encounter for screening mammogram for malignant neoplasm of breast: Secondary | ICD-10-CM

## 2024-04-08 ENCOUNTER — Ambulatory Visit (HOSPITAL_COMMUNITY)
Admission: RE | Admit: 2024-04-08 | Discharge: 2024-04-08 | Disposition: A | Discharge status: Home / Self Care | Source: Ambulatory Visit | Attending: Internal Medicine | Admitting: Internal Medicine

## 2024-04-08 DIAGNOSIS — Z1231 Encounter for screening mammogram for malignant neoplasm of breast: Secondary | ICD-10-CM | POA: Insufficient documentation

## 2024-08-16 ENCOUNTER — Ambulatory Visit (HOSPITAL_COMMUNITY)
Admission: RE | Admit: 2024-08-16 | Discharge: 2024-08-16 | Disposition: A | Discharge status: Home / Self Care | Source: Ambulatory Visit | Attending: "Endocrinology | Admitting: "Endocrinology

## 2024-08-16 DIAGNOSIS — C73 Malignant neoplasm of thyroid gland: Secondary | ICD-10-CM | POA: Insufficient documentation

## 2024-08-26 ENCOUNTER — Other Ambulatory Visit (HOSPITAL_COMMUNITY): Payer: Self-pay | Admitting: Orthopedic Surgery

## 2024-08-26 DIAGNOSIS — M5416 Radiculopathy, lumbar region: Secondary | ICD-10-CM

## 2024-08-26 DIAGNOSIS — G959 Disease of spinal cord, unspecified: Secondary | ICD-10-CM

## 2024-08-27 ENCOUNTER — Other Ambulatory Visit: Payer: Self-pay | Admitting: "Endocrinology

## 2024-09-04 ENCOUNTER — Ambulatory Visit (HOSPITAL_COMMUNITY)

## 2024-09-09 ENCOUNTER — Ambulatory Visit: Admitting: "Endocrinology

## 2024-09-11 ENCOUNTER — Ambulatory Visit (HOSPITAL_COMMUNITY)

## 2024-09-12 LAB — LIPID PANEL
Chol/HDL Ratio: 3 ratio (ref 0.0–4.4)
Cholesterol, Total: 158 mg/dL (ref 100–199)
HDL: 52 mg/dL
LDL Chol Calc (NIH): 82 mg/dL (ref 0–99)
Triglycerides: 138 mg/dL (ref 0–149)
VLDL Cholesterol Cal: 24 mg/dL (ref 5–40)

## 2024-09-12 LAB — T4, FREE: Free T4: 1.68 ng/dL (ref 0.82–1.77)

## 2024-09-12 LAB — TSH: TSH: 1.58 u[IU]/mL (ref 0.450–4.500)

## 2024-09-12 LAB — THYROGLOBULIN ANTIBODY: Thyroglobulin Antibody: <1 [IU]/mL (ref 0.0–0.9)

## 2024-09-12 LAB — THYROGLOBULIN LEVEL: Thyroglobulin (TG-RIA): <2 ng/mL

## 2024-09-16 ENCOUNTER — Ambulatory Visit: Admitting: "Endocrinology

## 2024-09-29 ENCOUNTER — Ambulatory Visit: Admitting: "Endocrinology

## 2024-10-11 ENCOUNTER — Other Ambulatory Visit (HOSPITAL_COMMUNITY)

## 2024-10-11 ENCOUNTER — Ambulatory Visit (HOSPITAL_COMMUNITY)
